# Patient Record
Sex: Male | Born: 1980
Health system: Southern US, Community
[De-identification: ages and names within clinical notes are randomized; demographics above are authoritative.]

## PROBLEM LIST (undated history)

## (undated) DIAGNOSIS — K589 Irritable bowel syndrome without diarrhea: Secondary | ICD-10-CM

## (undated) DIAGNOSIS — K219 Gastro-esophageal reflux disease without esophagitis: Secondary | ICD-10-CM

## (undated) DIAGNOSIS — R42 Dizziness and giddiness: Secondary | ICD-10-CM

## (undated) DIAGNOSIS — G8929 Other chronic pain: Secondary | ICD-10-CM

## (undated) DIAGNOSIS — IMO0001 Reserved for inherently not codable concepts without codable children: Secondary | ICD-10-CM

## (undated) DIAGNOSIS — M5020 Other cervical disc displacement, unspecified cervical region: Secondary | ICD-10-CM

## (undated) DIAGNOSIS — R1031 Right lower quadrant pain: Secondary | ICD-10-CM

## (undated) DIAGNOSIS — J309 Allergic rhinitis, unspecified: Secondary | ICD-10-CM

## (undated) DIAGNOSIS — R41840 Attention and concentration deficit: Secondary | ICD-10-CM

## (undated) DIAGNOSIS — L723 Sebaceous cyst: Secondary | ICD-10-CM

## (undated) DIAGNOSIS — R1013 Epigastric pain: Secondary | ICD-10-CM

## (undated) DIAGNOSIS — E785 Hyperlipidemia, unspecified: Secondary | ICD-10-CM

## (undated) DIAGNOSIS — M549 Dorsalgia, unspecified: Secondary | ICD-10-CM

## (undated) DIAGNOSIS — K649 Unspecified hemorrhoids: Secondary | ICD-10-CM

## (undated) HISTORY — DX: Sebaceous cyst: L72.3

## (undated) HISTORY — DX: Gastro-esophageal reflux disease without esophagitis: K21.9

## (undated) HISTORY — DX: Hyperlipidemia, unspecified: E78.5

## (undated) HISTORY — DX: Irritable bowel syndrome, unspecified: K58.9

## (undated) HISTORY — DX: Right lower quadrant pain: R10.31

## (undated) HISTORY — PX: MOUTH SURGERY: SHX715

## (undated) HISTORY — DX: Reserved for inherently not codable concepts without codable children: IMO0001

## (undated) HISTORY — DX: Allergic rhinitis, unspecified: J30.9

## (undated) HISTORY — DX: Attention and concentration deficit: R41.840

## (undated) HISTORY — DX: Other cervical disc displacement, unspecified cervical region: M50.20

## (undated) HISTORY — DX: Unspecified hemorrhoids: K64.9

## (undated) HISTORY — DX: Epigastric pain: R10.13

## (undated) HISTORY — PX: SPERMATIC VEIN LIGATION: SHX5144

## (undated) HISTORY — DX: Dizziness and giddiness: R42

## (undated) HISTORY — PX: HYDROCELE EXCISION / REPAIR: SUR1145

---

## 2008-12-12 ENCOUNTER — Ambulatory Visit (HOSPITAL_BASED_OUTPATIENT_CLINIC_OR_DEPARTMENT_OTHER): Admission: RE | Admit: 2008-12-12 | Discharge: 2008-12-12 | Payer: Self-pay | Admitting: Family Medicine

## 2008-12-12 ENCOUNTER — Ambulatory Visit: Payer: Self-pay | Admitting: Diagnostic Radiology

## 2008-12-31 ENCOUNTER — Ambulatory Visit: Payer: Self-pay | Admitting: Diagnostic Radiology

## 2008-12-31 ENCOUNTER — Ambulatory Visit (HOSPITAL_BASED_OUTPATIENT_CLINIC_OR_DEPARTMENT_OTHER): Admission: RE | Admit: 2008-12-31 | Discharge: 2008-12-31 | Payer: Self-pay | Admitting: Family Medicine

## 2010-10-31 HISTORY — PX: COLONOSCOPY: SHX174

## 2010-10-31 HISTORY — PX: ESOPHAGOGASTRODUODENOSCOPY: SHX1529

## 2012-06-20 ENCOUNTER — Ambulatory Visit (INDEPENDENT_AMBULATORY_CARE_PROVIDER_SITE_OTHER): Payer: Self-pay | Admitting: General Surgery

## 2012-06-27 ENCOUNTER — Ambulatory Visit (INDEPENDENT_AMBULATORY_CARE_PROVIDER_SITE_OTHER): Payer: Self-pay | Admitting: General Surgery

## 2012-07-11 ENCOUNTER — Telehealth (INDEPENDENT_AMBULATORY_CARE_PROVIDER_SITE_OTHER): Payer: Self-pay

## 2012-07-11 NOTE — Telephone Encounter (Signed)
Left message to call our office regarding appointment, appointment has been rescheduled to 07/20/12 @ 11:15 am w/Dr. Biagio Quint due to office cancellation

## 2012-07-12 ENCOUNTER — Ambulatory Visit (INDEPENDENT_AMBULATORY_CARE_PROVIDER_SITE_OTHER): Payer: Self-pay | Admitting: General Surgery

## 2012-07-19 ENCOUNTER — Telehealth (INDEPENDENT_AMBULATORY_CARE_PROVIDER_SITE_OTHER): Payer: Self-pay

## 2012-07-19 NOTE — Telephone Encounter (Signed)
Left voicemail for Larry Mercado @ ext [phone] requesting medical records.

## 2012-07-20 ENCOUNTER — Encounter (INDEPENDENT_AMBULATORY_CARE_PROVIDER_SITE_OTHER): Payer: Self-pay | Admitting: General Surgery

## 2012-07-20 ENCOUNTER — Ambulatory Visit (INDEPENDENT_AMBULATORY_CARE_PROVIDER_SITE_OTHER): Payer: Private Health Insurance - Indemnity | Admitting: General Surgery

## 2012-07-20 ENCOUNTER — Ambulatory Visit
Admission: RE | Admit: 2012-07-20 | Discharge: 2012-07-20 | Disposition: A | Discharge status: Home / Self Care | Payer: Managed Care, Other (non HMO) | Source: Ambulatory Visit | Attending: General Surgery | Admitting: General Surgery

## 2012-07-20 VITALS — BP 122/82 | HR 64 | Temp 97.4°F | Resp 12 | Ht 71.5 in | Wt 147.6 lb

## 2012-07-20 DIAGNOSIS — R1031 Right lower quadrant pain: Secondary | ICD-10-CM

## 2012-07-20 NOTE — Progress Notes (Signed)
Patient ID: Larry Mercado, male   DOB: 05/02/1981, 31 y.o.   MRN: [id number]  Chief Complaint  Patient presents with  . Inguinal Hernia    new pt- eval RIH    HPI Larry Mercado is a 31 y.o. male.   HPI This patient is referred by Dr. Nicholaus Bloom for evaluation of right groin pain and possible right inguinal hernia. He says that this has bothered him off and on for a few years now and has not really caused significant pain to limit his lifestyle but he has a "annoyance and discomfort" in his right groin area.  He was actually referred to a surgeon in [facility] last year for possible hernia but no hernia was found on exam and per the patient history no surgery was recommended. He denies any bulge in the area of and denies any obstructive symptoms although he does have some history of constipation.He also has a history of a right hydrocele repair as a child Past Medical History  Diagnosis Date  . Allergic rhinitis   . Dyslipidemia   . Dyspepsia   . GERD (gastroesophageal reflux disease)   . Groin pain, right lower quadrant   . IBS (irritable bowel syndrome)   . Sebaceous cyst     Past Surgical History  Procedure Date  . Mouth surgery   . Spermatic vein ligation   . Hydrocele excision / repair     Family History  Problem Relation Age of Onset  . Cancer Father     brain  . Heart disease Paternal Grandfather   . Cancer Maternal Grandmother     mesothelioma    Social History History  Substance Use Topics  . Smoking status: Never Smoker   . Smokeless tobacco: Not on file  . Alcohol Use: Yes    No Known Allergies  Current Outpatient Prescriptions  Medication Sig Dispense Refill  . aspirin 81 MG tablet Take 81 mg by mouth daily.      . fluticasone (FLONASE) 50 MCG/ACT nasal spray Place 2 sprays into the nose daily.      . Multiple Vitamin (MULTIVITAMIN) capsule Take 1 capsule by mouth daily.      . nabumetone (RELAFEN) 500 MG tablet Take 500 mg by mouth 2 (two) times daily.        Marland Kitchen NIASPAN 750 MG CR tablet daily.      Maxwell Caul Bicarbonate (ZEGERID) 20-1100 MG CAPS Take 1 capsule by mouth daily before breakfast.        Review of Systems Review of Systems All other review of systems negative or noncontributory except as stated in the HPI  Blood pressure 122/82, pulse 64, temperature 97.4 F (36.3 C), temperature source Temporal, resp. rate 12, height 5' 11.5" (1.816 m), weight 147 lb 9.6 oz (66.951 kg).  Physical Exam Physical Exam Physical Exam  Vitals reviewed. Constitutional: He is oriented to person, place, and time. He appears well-developed and well-nourished. No distress.  HENT:  Head: Normocephalic and atraumatic.  Mouth/Throat: No oropharyngeal exudate.  Eyes: Conjunctivae and EOM are normal. Pupils are equal, round, and reactive to light. Right eye exhibits no discharge. Left eye exhibits no discharge. No scleral icterus.  Neck: Normal range of motion. No tracheal deviation present.  Cardiovascular: Normal rate, regular rhythm and normal heart sounds.   Pulmonary/Chest: Effort normal and breath sounds normal. No stridor. No respiratory distress. He has no wheezes. He has no rales. He exhibits no tenderness.  Abdominal: Soft. Bowel sounds are normal. He exhibits  no distension and no mass. There is no tenderness. There is no rebound and no guarding. I do not appreciate any obvious hernias bilaterally on exam he does have a questionable cough impulse in the right and I can see what Dr. Nicholaus Bloom was appreciating but it is unclear if this is really a hernia. His testicles are normal and I do not appreciate any left inguinal hernia on exam  Musculoskeletal: Normal range of motion. He exhibits no edema and no tenderness.  Neurological: He is alert and oriented to person, place, and time.  Skin: Skin is warm and dry. No rash noted. He is not diaphoretic. No erythema. No pallor.  Psychiatric: He has a normal mood and affect. His behavior is normal.  Judgment and thought content normal.    Data Reviewed   Assessment    Right groin pain He does have some chronic right groin discomfort but it is unclear at this time whether he has a hernia.  I can appreciate a small cough impulse on the right but this is not clearly a hernia. I discussed with him the options of continued observation and watch waiting versus imaging with ultrasound or CT scan of versus diagnostic laparoscopy. We discussed the pros and cons of each approach with him we have decided to continue with dynamic abdominal ultrasound to evaluate for possible hernia. I recommended that he follow up with me in 3 weeks to discuss the results of his ultrasound. If his ultrasound is positive for hernia, and we will go ahead and discussed the surgical options if this is negative, then given the lack of obvious hernia on exam, I would not recommend any surgery and would continue with watchful waiting.    Plan    He will followup in 3 weeks to discuss his ultrasound results       [name] DAVID 07/20/2012, 11:51 AM

## 2012-07-26 ENCOUNTER — Telehealth (INDEPENDENT_AMBULATORY_CARE_PROVIDER_SITE_OTHER): Payer: Self-pay

## 2012-07-26 NOTE — Telephone Encounter (Signed)
Return call -- Mr. Tomassi notified of U/S results (No evidence of Inguinal hernia, mass or adenopathy).

## 2012-08-08 ENCOUNTER — Encounter (INDEPENDENT_AMBULATORY_CARE_PROVIDER_SITE_OTHER): Payer: Private Health Insurance - Indemnity | Admitting: General Surgery

## 2015-02-05 ENCOUNTER — Ambulatory Visit (INDEPENDENT_AMBULATORY_CARE_PROVIDER_SITE_OTHER): Payer: BLUE CROSS/BLUE SHIELD | Admitting: Family Medicine

## 2015-02-05 ENCOUNTER — Encounter: Payer: Self-pay | Admitting: Family Medicine

## 2015-02-05 VITALS — BP 119/77 | HR 63 | Ht 71.75 in | Wt 158.0 lb

## 2015-02-05 DIAGNOSIS — F332 Major depressive disorder, recurrent severe without psychotic features: Secondary | ICD-10-CM | POA: Diagnosis not present

## 2015-02-05 DIAGNOSIS — F418 Other specified anxiety disorders: Secondary | ICD-10-CM | POA: Diagnosis not present

## 2015-02-05 DIAGNOSIS — F32A Depression, unspecified: Secondary | ICD-10-CM

## 2015-02-05 DIAGNOSIS — L719 Rosacea, unspecified: Secondary | ICD-10-CM | POA: Diagnosis not present

## 2015-02-05 DIAGNOSIS — F419 Anxiety disorder, unspecified: Principal | ICD-10-CM

## 2015-02-05 DIAGNOSIS — F329 Major depressive disorder, single episode, unspecified: Secondary | ICD-10-CM | POA: Insufficient documentation

## 2015-02-05 DIAGNOSIS — R748 Abnormal levels of other serum enzymes: Secondary | ICD-10-CM | POA: Diagnosis not present

## 2015-02-05 LAB — LIPID PANEL
CHOL/HDL RATIO: 4.1 ratio
Cholesterol: 127 mg/dL (ref 0–200)
HDL: 31 mg/dL — AB (ref >=40)
LDL CALC: 76 mg/dL (ref 0–99)
TRIGLYCERIDES: 98 mg/dL (ref <150)
VLDL: 20 mg/dL (ref 0–40)

## 2015-02-05 MED ORDER — METRONIDAZOLE 1 % EX GEL: Freq: Every day | CUTANEOUS | Status: DC

## 2015-02-05 MED ORDER — ARIPIPRAZOLE 5 MG PO TABS: 5.0000 mg | ORAL_TABLET | Freq: Every day | ORAL | Status: DC

## 2015-02-05 NOTE — Progress Notes (Signed)
CC: Larry Mercado is a 34 y.o. male is here for Establish Care   Subjective: HPI:  Pleasant 34 year old here to establish care  Reports a history over the past 2 or 3 months of having episodic subjective depression it occurs every 2 or 3 days. He comes nowhere and within a matter of seconds he will have subjective gloom, depression, disinterest, sadness and occasionally crying spells. It'll last for a few hours and will resolve on its own. It's worse if he thinks about anything negative such as death, pain, etc. He's had these symptoms in the past when he was in high school and has tried Zoloft, Paxil, Prozac, Wellbutrin all of which were ineffective or caused significant side effects. He felt like he grew out of the symptoms late in high school and has not experienced any of these symptoms up until now. Nothing particularly makes it better or worse other than above. In the past he had thoughts of wanting to harm himself and he occasionally thinks about it now but never has a plan. He tells me that he is rational andhe would never harm himself or others. He's cut out alcohol and caffeine in his diet but it has not helped symptoms. Denies hallucinations paranoia or any other mental disturbance  Complains of a rash localized on the both cheeks that has been present for an unknown amount of time but seems to be worse after a shower. He is uncertain whether or not alcohol or sunlight amplifies it. He's tried moisturizing creams but it makes the symptoms worse. It's slightly tender and feels dry. Nothing else seems to make better or worse. He denies skin changes elsewhere  History of low HDL. Tells me that LDL and total cholesterol always in the normal range however HDL has been in the 30s but goes up to as high as 46 he has to take aspirin with this medication due to flushing and itching. No known cardiovascular disease.  Review of Systems - General ROS: negative for - chills, fever, night sweats, weight  gain or weight loss Ophthalmic ROS: negative for - decreased vision Psychological ROS: Positive for anxiety and depression ENT ROS: negative for - hearing change, nasal congestion, tinnitus or allergies Hematological and Lymphatic ROS: negative for - bleeding problems, bruising or swollen lymph nodes Breast ROS: negative Respiratory ROS: no cough, shortness of breath, or wheezing Cardiovascular ROS: no chest pain or dyspnea on exertion Gastrointestinal ROS: no abdominal pain, change in bowel habits, or black or bloody stools Genito-Urinary ROS: negative for - genital discharge, genital ulcers, incontinence or abnormal bleeding from genitals Musculoskeletal ROS: negative for - joint pain or muscle pain Neurological ROS: negative for - headaches or memory loss Dermatological ROS: negative for lumps, mole changes, rash and skin lesion changes other than that described above  Past Medical History  Diagnosis Date  . Allergic rhinitis   . Dyslipidemia   . Dyspepsia   . GERD (gastroesophageal reflux disease)   . Groin pain, right lower quadrant   . IBS (irritable bowel syndrome)   . Sebaceous cyst     Past Surgical History  Procedure Laterality Date  . Mouth surgery    . Spermatic vein ligation    . Hydrocele excision / repair     Family History  Problem Relation Age of Onset  . Cancer Father     brain  . Heart disease Paternal Grandfather   . Cancer Maternal Grandmother     mesothelioma    History  Social History  . Marital Status: Married    Spouse Name: N/A  . Number of Children: N/A  . Years of Education: N/A   Occupational History  . Not on file.   Social History Main Topics  . Smoking status: Never Smoker   . Smokeless tobacco: Not on file  . Alcohol Use: Yes  . Drug Use: No  . Sexual Activity: Not on file   Other Topics Concern  . Not on file   Social History Narrative     Objective: BP 119/77 mmHg  Pulse 63  Ht 5' 11.75" (1.822 m)  Wt 158 lb (71.668  kg)  BMI 21.59 kg/m2  General: Alert and Oriented, No Acute Distress HEENT: Pupils equal, round, reactive to light. Conjunctivae clear.  Moist mucous membranes Lungs: Clear to auscultation bilaterally, no wheezing/ronchi/rales.  Comfortable work of breathing. Good air movement. Cardiac: Regular rate and rhythm. Normal S1/S2.  No murmurs, rubs, nor gallops.   Extremities: No peripheral edema.  Strong peripheral pulses.  Mental Status: Mild to moderate depression and anxiety no paranoia or agitation Skin: Warm and dry. Maculopapular rash on both cheeks just above the beard not involving the nose  Assessment & Plan: Larry Mercado was seen today for establish care.  Diagnoses and all orders for this visit:  Anxiety and depression  Major depressive disorder, recurrent, severe without psychotic features Orders: -     ARIPiprazole (ABILIFY) 5 MG tablet; Take 1 tablet (5 mg total) by mouth daily.  Low serum HDL Orders: -     Lipid panel  Rosacea Orders: -     metroNIDAZOLE (METROGEL) 1 % gel; Apply topically daily. Only to redness on face.    anxiety and depression: uncontrolled chronic condition begin Abilify, follow-up in 2 weeks if no better otherwise return in one month if appearing to be effective. If ineffective next intervention would be something like Viibryid or Fetzima,  He specifically tells me he does not want to see a psychiatrist  Low serum HDL: checking lipid panel today  If he continues to be low we'll consider fish oil or restarting niacin  Rosacea:  This is only working diagnosis at this time, rash also be from eczema or seborrheic dermatitis, trial of topical metronidazole  Return for 2 week follow up if no improvement in mood, otherwise follow up one month..Marland Kitchen

## 2015-02-06 ENCOUNTER — Telehealth: Payer: Self-pay | Admitting: Family Medicine

## 2015-02-06 MED ORDER — FISH OIL 1000 MG PO CAPS: ORAL_CAPSULE | ORAL | Status: DC

## 2015-02-06 NOTE — Telephone Encounter (Signed)
Left message on vm

## 2015-02-06 NOTE — Telephone Encounter (Signed)
Sue Lushndrea, Will you please let patient know that his HDL cholesterol was slightly low as he predicted.  It was 31 with a goal of 40 or above.  I'd recommend starting a 1g OTC fish oil supplement with meals twice a day since this should help with cholesterol without any side effects.  We'll want to recheck this in 3 months.

## 2015-02-25 ENCOUNTER — Ambulatory Visit (INDEPENDENT_AMBULATORY_CARE_PROVIDER_SITE_OTHER): Payer: BLUE CROSS/BLUE SHIELD | Admitting: Family Medicine

## 2015-02-25 ENCOUNTER — Encounter: Payer: Self-pay | Admitting: Family Medicine

## 2015-02-25 VITALS — BP 112/77 | HR 86 | Temp 98.0°F | Resp 18 | Ht 71.0 in | Wt 160.0 lb

## 2015-02-25 DIAGNOSIS — F418 Other specified anxiety disorders: Secondary | ICD-10-CM | POA: Diagnosis not present

## 2015-02-25 DIAGNOSIS — F32A Depression, unspecified: Secondary | ICD-10-CM

## 2015-02-25 DIAGNOSIS — F329 Major depressive disorder, single episode, unspecified: Secondary | ICD-10-CM

## 2015-02-25 DIAGNOSIS — F419 Anxiety disorder, unspecified: Principal | ICD-10-CM

## 2015-02-25 DIAGNOSIS — R0602 Shortness of breath: Secondary | ICD-10-CM

## 2015-02-25 DIAGNOSIS — R0600 Dyspnea, unspecified: Secondary | ICD-10-CM | POA: Insufficient documentation

## 2015-02-25 DIAGNOSIS — R0609 Other forms of dyspnea: Secondary | ICD-10-CM | POA: Insufficient documentation

## 2015-02-25 MED ORDER — VILAZODONE HCL 10 & 20 MG PO KIT: 1.0000 | PACK | Freq: Every day | ORAL | Status: DC

## 2015-02-25 NOTE — Progress Notes (Signed)
CC: Larry Mercado is a 34 y.o. male is here for Depression   Subjective: HPI:  Follow-up depression: He tells me that since taking Abilify he has had a drastic improvement with his depressive episodes. He feels overall happy now and is extremely happy with the response of the medication with respect to depression. He is a little put off by a side effect of dizziness that has been present and not changing since he started taking the medication. It occurs both with activity and at rest. Also accompanied by a sense of heaviness in the back of his head. Symptoms are mild to moderate in severity but significantly interfering with quality of life. Denies thoughts of harming self or others. Denies any other new mental disturbance.  Complains of shortness of breath that has been present for matter of years. It occurs randomly when exerting himself such as climbing stairs. It is not reproducible or predictable. His former PCP gave him an albuterol inhaler however this is not helping or worsening the situation. When symptoms occur they last a few minutes and are accompanied by a sensation of pressure on the chest. There've been a few times that he's got the sensation even when relaxing and staying still. He denies cough, wheezing or chest discomfort and the other situation. This weekend he was active working around his yard and painting a fence and had no shortness of breath or chest discomfort whatsoever.    Review Of Systems Outlined In HPI  Past Medical History  Diagnosis Date  . Allergic rhinitis   . Dyslipidemia   . Dyspepsia   . GERD (gastroesophageal reflux disease)   . Groin pain, right lower quadrant   . IBS (irritable bowel syndrome)   . Sebaceous cyst     Past Surgical History  Procedure Laterality Date  . Mouth surgery    . Spermatic vein ligation    . Hydrocele excision / repair     Family History  Problem Relation Age of Onset  . Cancer Father     brain  . Heart disease Paternal  Grandfather   . Cancer Maternal Grandmother     mesothelioma    History   Social History  . Marital Status: Married    Spouse Name: N/A  . Number of Children: N/A  . Years of Education: N/A   Occupational History  . Not on file.   Social History Main Topics  . Smoking status: Never Smoker   . Smokeless tobacco: Not on file  . Alcohol Use: Yes  . Drug Use: No  . Sexual Activity: Not on file   Other Topics Concern  . Not on file   Social History Narrative     Objective: BP 112/77 mmHg  Pulse 86  Temp(Src) 98 F (36.7 C) (Oral)  Resp 18  Ht 5' 11" (1.803 m)  Wt 160 lb (72.576 kg)  BMI 22.33 kg/m2  SpO2 97%  General: Alert and Oriented, No Acute Distress HEENT: Pupils equal, round, reactive to light. Conjunctivae clear. Moist nevus membranes  Lungs: Clear to auscultation bilaterally, no wheezing/ronchi/rales.  Comfortable work of breathing. Good air movement. Cardiac: Regular rate and rhythm. Normal S1/S2.  No murmurs, rubs, nor gallops.   Extremities: No peripheral edema.  Strong peripheral pulses.  Mental Status: No depression, anxiety, nor agitation. Skin: Warm and dry.  Assessment & Plan: [name] was seen today for depression.  Diagnoses and all orders for this visit:  Anxiety and depression  SOB (shortness of breath)  Other  orders -     Vilazodone HCl (VIIBRYD STARTER PACK) 10 & 20 MG KIT; Take 1 tablet by mouth daily.   Anxiety and depression: Improved and controlled however side effects from Abilify. Joint decision to stop this medication and begin viibryd, 1 month prescription for the starter kit was provided. If beneficial with no side effects all he needs to do is call for refills to last him for 3 months. If side effects are ineffective follow-up before running out of the starter kit. Shortness of breath: I've asked him to follow-up at his convenience for a 30 minute spirometry visit for further investigation into this.  Return in about 3 months  (around 05/27/2015).

## 2015-03-12 ENCOUNTER — Telehealth: Payer: Self-pay

## 2015-03-12 MED ORDER — ARIPIPRAZOLE 2 MG PO TABS: 2.0000 mg | ORAL_TABLET | Freq: Every day | ORAL | Status: DC

## 2015-03-12 NOTE — Telephone Encounter (Signed)
 2mg  [facility] [place], he had been taking the 5mg , hopefully less chance of side effects

## 2015-03-12 NOTE — Telephone Encounter (Signed)
Patient states the medication (Viibryd) is not working as well and the Abilify. He did like how the Abilify work but he didn't like the side effects. He is hoping to try a different dose of the Abilify. Please advise.

## 2015-03-13 NOTE — Telephone Encounter (Signed)
Patient advised.

## 2015-03-26 ENCOUNTER — Telehealth: Payer: Self-pay | Admitting: *Deleted

## 2015-03-26 MED ORDER — ARIPIPRAZOLE 2 MG PO TABS: 4.0000 mg | ORAL_TABLET | Freq: Every day | ORAL | Status: DC

## 2015-03-26 NOTE — Telephone Encounter (Signed)
Patient may certainly take 4 mg, doubling up on his current dose. It does come in multiple other dosage forms, he is on only the starting dose.

## 2015-03-26 NOTE — Telephone Encounter (Signed)
Pt called in this morning stating that the abilify seems to just be "taking the edge off".  He said that he's tolerating the side effects but wanted to know if there was another dose in between the 2mg  & 5mg , or if he could just take 2 tabs to equal 4mg .  Please advise.

## 2015-03-26 NOTE — Telephone Encounter (Signed)
LMOM notifying pt to increase to 4mg  of abilify and to f/u with Dr. Ivan AnchorsHommel sometime within the next 2 weeks.

## 2015-03-26 NOTE — Addendum Note (Signed)
Addended by: Collie SiadICHARDSON, Andruw Battie M on: 03/26/2015 02:44 PM   Modules accepted: Orders

## 2015-04-15 ENCOUNTER — Ambulatory Visit (INDEPENDENT_AMBULATORY_CARE_PROVIDER_SITE_OTHER): Payer: BLUE CROSS/BLUE SHIELD | Admitting: Family Medicine

## 2015-04-15 ENCOUNTER — Encounter: Payer: Self-pay | Admitting: Family Medicine

## 2015-04-15 VITALS — BP 116/72 | HR 78 | Wt 163.0 lb

## 2015-04-15 DIAGNOSIS — F418 Other specified anxiety disorders: Secondary | ICD-10-CM | POA: Diagnosis not present

## 2015-04-15 DIAGNOSIS — F329 Major depressive disorder, single episode, unspecified: Secondary | ICD-10-CM

## 2015-04-15 DIAGNOSIS — E786 Lipoprotein deficiency: Secondary | ICD-10-CM | POA: Diagnosis not present

## 2015-04-15 DIAGNOSIS — F32A Depression, unspecified: Secondary | ICD-10-CM

## 2015-04-15 DIAGNOSIS — F419 Anxiety disorder, unspecified: Principal | ICD-10-CM

## 2015-04-15 MED ORDER — ARIPIPRAZOLE 2 MG PO TABS: 4.0000 mg | ORAL_TABLET | Freq: Every day | ORAL | Status: DC

## 2015-04-15 NOTE — Progress Notes (Signed)
CC: Larry Mercado is a 34 y.o. male is here for f/u anxiety and depression   Subjective: HPI:  Follow-up anxiety and depression: Last month he began taking 2, 2 milligram tablets of Abilify daily. Currently he feels like his depressive and anxiety symptoms are 90% resolved with 0% side effects. He is pleased with this balance compared to what he was experiencing on the 5 mg formulation. He denies any known side effects other than some hunger. He states he is pretty happy with the response at this point. He denies thoughts of harm self or others.  Follow-up HDL deficiency. He's been taking 1200 mg of fish oil twice a day for 2 months now. He denies any right upper quadrant pain and chest pain or limb claudication.   Review Of Systems Outlined In HPI  Past Medical History  Diagnosis Date  . Allergic rhinitis   . Dyslipidemia   . Dyspepsia   . GERD (gastroesophageal reflux disease)   . Groin pain, right lower quadrant   . IBS (irritable bowel syndrome)   . Sebaceous cyst     Past Surgical History  Procedure Laterality Date  . Mouth surgery    . Spermatic vein ligation    . Hydrocele excision / repair     Family History  Problem Relation Age of Onset  . Cancer Father     brain  . Heart disease Paternal Grandfather   . Cancer Maternal Grandmother     mesothelioma    History   Social History  . Marital Status: Married    Spouse Name: N/A  . Number of Children: N/A  . Years of Education: N/A   Occupational History  . Not on file.   Social History Main Topics  . Smoking status: Never Smoker   . Smokeless tobacco: Not on file  . Alcohol Use: Yes  . Drug Use: No  . Sexual Activity: Not on file   Other Topics Concern  . Not on file   Social History Narrative     Objective: BP 116/72 mmHg  Pulse 78  Wt 163 lb (73.936 kg)  Vital signs reviewed. General: Alert and Oriented, No Acute Distress HEENT: Pupils equal, round, reactive to light. Conjunctivae clear.   External ears unremarkable.  Moist mucous membranes. Lungs: Clear and comfortable work of breathing, speaking in full sentences without accessory muscle use. Cardiac: Regular rate and rhythm.  Neuro: CN II-XII grossly intact, gait normal. Extremities: No peripheral edema.  Strong peripheral pulses.  Mental Status: No depression, anxiety, nor agitation. Logical though process. Skin: Warm and dry.  Assessment & Plan: Larry Mercado was seen today for f/u anxiety and depression.  Diagnoses and all orders for this visit:  Anxiety and depression  Low HDL (under 40) Orders: -     Lipid panel  Other orders -     ARIPiprazole (ABILIFY) 2 MG tablet; Take 2 tablets (4 mg total) by mouth daily.   Anxiety and depression: Controlled he is willing to put up with the side effect of hunger for the benefit this is providing him. Continue 4 mg of Abilify daily. Low HDL: Continue fish oil and recheck lipid panel in 1 month, lab slips were provided for lab only visit  Return in about 6 months (around 10/15/2015) for Mood.

## 2015-05-18 ENCOUNTER — Encounter: Payer: Self-pay | Admitting: Family Medicine

## 2015-05-18 ENCOUNTER — Ambulatory Visit (INDEPENDENT_AMBULATORY_CARE_PROVIDER_SITE_OTHER): Payer: BLUE CROSS/BLUE SHIELD | Admitting: Family Medicine

## 2015-05-18 VITALS — BP 106/69 | HR 76 | Wt 164.0 lb

## 2015-05-18 DIAGNOSIS — F418 Other specified anxiety disorders: Secondary | ICD-10-CM

## 2015-05-18 DIAGNOSIS — R748 Abnormal levels of other serum enzymes: Secondary | ICD-10-CM

## 2015-05-18 DIAGNOSIS — F32A Depression, unspecified: Secondary | ICD-10-CM

## 2015-05-18 DIAGNOSIS — F419 Anxiety disorder, unspecified: Principal | ICD-10-CM

## 2015-05-18 DIAGNOSIS — F329 Major depressive disorder, single episode, unspecified: Secondary | ICD-10-CM

## 2015-05-18 MED ORDER — VENLAFAXINE HCL ER 75 MG PO CP24: 75.0000 mg | ORAL_CAPSULE | Freq: Every day | ORAL | Status: DC

## 2015-05-18 NOTE — Progress Notes (Signed)
CC: Larry FinlayJeremy Mercado is a 34 y.o. male is here for f/u abilify   Subjective: HPI:  Follow-up anxiety and depression: States that anxiety is manageable right now with mild to no interfering with quality of life. States that depression seems to be more pronounced and has been worsening the more he hears about mass shootings and natural disasters both domestically and internationally. He's lost interest in hobbies. He was more irritable towards others. He just feels plain out sad. He denies thoughts of wanting to harm self or others. Denies any known side effects from Abilify. He wants but he can add to Abilify. Denies any paranoia or any motor or sensory disturbances. Denies unintentional weight loss or gain.  He's due for repeat of his HDL. He's been taking official capsule every day now. No physical exercise routine. Denies any exertional chest pain or limb claudication.  Review Of Systems Outlined In HPI  Past Medical History  Diagnosis Date  . Allergic rhinitis   . Dyslipidemia   . Dyspepsia   . GERD (gastroesophageal reflux disease)   . Groin pain, right lower quadrant   . IBS (irritable bowel syndrome)   . Sebaceous cyst     Past Surgical History  Procedure Laterality Date  . Mouth surgery    . Spermatic vein ligation    . Hydrocele excision / repair     Family History  Problem Relation Age of Onset  . Cancer Father     brain  . Heart disease Paternal Grandfather   . Cancer Maternal Grandmother     mesothelioma    History   Social History  . Marital Status: Married    Spouse Name: N/A  . Number of Children: N/A  . Years of Education: N/A   Occupational History  . Not on file.   Social History Main Topics  . Smoking status: Never Smoker   . Smokeless tobacco: Not on file  . Alcohol Use: Yes  . Drug Use: No  . Sexual Activity: Not on file   Other Topics Concern  . Not on file   Social History Narrative     Objective: BP 106/69 mmHg  Pulse 76  Wt 164 lb  (74.39 kg)  Vital signs reviewed. General: Alert and Oriented, No Acute Distress HEENT: Pupils equal, round, reactive to light. Conjunctivae clear.  External ears unremarkable.  Moist mucous membranes. Lungs: Clear and comfortable work of breathing, speaking in full sentences without accessory muscle use. Cardiac: Regular rate and rhythm.  Neuro: CN II-XII grossly intact, gait normal. Extremities: No peripheral edema.  Strong peripheral pulses.  Mental Status: No anxiety, nor agitation. Logical though process. Skin: Warm and dry.  Assessment & Plan: Larry Mercado was seen today for f/u abilify.  Diagnoses and all orders for this visit:  Anxiety and depression Orders: -     venlafaxine XR (EFFEXOR XR) 75 MG 24 hr capsule; Take 1 capsule (75 mg total) by mouth daily with breakfast.  Low serum HDL   Anxiety depression: Anxiety seems to control depression is uncontrolled. Start low-dose Effexor along with current dose of Abilify. Low HDL: Due for repeat lipid panel, lab slip provided.  Return if symptoms worsen or fail to improve.

## 2015-05-19 ENCOUNTER — Telehealth: Payer: Self-pay | Admitting: Family Medicine

## 2015-05-19 LAB — LIPID PANEL
Cholesterol: 127 mg/dL (ref 0–200)
HDL: 34 mg/dL — ABNORMAL LOW (ref >=40)
LDL CALC: 69 mg/dL (ref 0–99)
TRIGLYCERIDES: 122 mg/dL (ref <150)
Total CHOL/HDL Ratio: 3.7 Ratio
VLDL: 24 mg/dL (ref 0–40)

## 2015-05-19 MED ORDER — NIACIN 500 MG PO TABS: 500.0000 mg | ORAL_TABLET | Freq: Every day | ORAL | Status: DC

## 2015-05-19 NOTE — Telephone Encounter (Signed)
Left message on voicemail with results

## 2015-05-19 NOTE — Telephone Encounter (Signed)
Sue Lushndrea, Will you please let patient know that his HDL cholesterol really hasn't improved by much, just by three points and is still in the deficient range.  I'd recommend stopping his fish oil supplement and switching to OTC niacin at a dose of 500-1000mg  nightly whichever dose is most tolerated when it comes to the most common side effect of a flushing sensation.  Repeat this test in three months.

## 2015-09-01 ENCOUNTER — Encounter: Payer: Self-pay | Admitting: Family Medicine

## 2015-09-01 ENCOUNTER — Ambulatory Visit (INDEPENDENT_AMBULATORY_CARE_PROVIDER_SITE_OTHER): Payer: BLUE CROSS/BLUE SHIELD | Admitting: Family Medicine

## 2015-09-01 VITALS — BP 114/72 | HR 81 | Temp 98.0°F | Wt 161.0 lb

## 2015-09-01 DIAGNOSIS — F418 Other specified anxiety disorders: Secondary | ICD-10-CM | POA: Diagnosis not present

## 2015-09-01 DIAGNOSIS — R05 Cough: Secondary | ICD-10-CM

## 2015-09-01 DIAGNOSIS — R059 Cough, unspecified: Secondary | ICD-10-CM

## 2015-09-01 DIAGNOSIS — L719 Rosacea, unspecified: Secondary | ICD-10-CM | POA: Diagnosis not present

## 2015-09-01 DIAGNOSIS — R41840 Attention and concentration deficit: Secondary | ICD-10-CM | POA: Diagnosis not present

## 2015-09-01 DIAGNOSIS — F32A Depression, unspecified: Secondary | ICD-10-CM

## 2015-09-01 DIAGNOSIS — F419 Anxiety disorder, unspecified: Principal | ICD-10-CM

## 2015-09-01 DIAGNOSIS — F329 Major depressive disorder, single episode, unspecified: Secondary | ICD-10-CM

## 2015-09-01 MED ORDER — DOXYCYCLINE 40 MG PO CPDR: 40.0000 mg | DELAYED_RELEASE_CAPSULE | ORAL | Status: DC

## 2015-09-01 MED ORDER — PREDNISONE 20 MG PO TABS: ORAL_TABLET | ORAL | Status: AC

## 2015-09-01 NOTE — Progress Notes (Signed)
CC: Larry FinlayJeremy Mercado is a 34 y.o. male is here for Cough   Subjective: HPI:  Follow-up anxiety and depression: Currently only taking Abilify on a daily basis. He did not end up taking the Effexor prescribed last time he was here. He denies any anxiety or depression is interfering with his quality of life right now.  Reports difficulty with concentration as a child and continuing in adulthood. He was diagnosed with ADHD and treated as a child but he wants to know whether or not he still suffers from this condition. He is noticing difficulty keeping on track at work and at home when it comes to responsibilities. His wife has noticed that he is easily distractible.  Complains of a cough that has been present for the past week and a daily basis. It's persistent and all hours of the day. Accompanied by soreness in the chest when he coughs but no pain with breathing. He denies any new shortness of breath, wheezing, nor blood in sputum. He denies nasal congestion or sore throat. No fevers or chills. No benefit from Robitussin  Complains of persistent rosacea for the described as redness and cracking of the skin on the cheeks. Symptoms are improved with using metronidazole however if he skips a few days it comes right back. He started having to use a topical preparation once another something better. He denies skin changes elsewhere   Review Of Systems Outlined In HPI  Past Medical History  Diagnosis Date  . Allergic rhinitis   . Dyslipidemia   . Dyspepsia   . GERD (gastroesophageal reflux disease)   . Groin pain, right lower quadrant   . IBS (irritable bowel syndrome)   . Sebaceous cyst     Past Surgical History  Procedure Laterality Date  . Mouth surgery    . Spermatic vein ligation    . Hydrocele excision / repair     Family History  Problem Relation Age of Onset  . Cancer Father     brain  . Heart disease Paternal Grandfather   . Cancer Maternal Grandmother     mesothelioma     Social History   Social History  . Marital Status: Married    Spouse Name: N/A  . Number of Children: N/A  . Years of Education: N/A   Occupational History  . Not on file.   Social History Main Topics  . Smoking status: Never Smoker   . Smokeless tobacco: Not on file  . Alcohol Use: Yes  . Drug Use: No  . Sexual Activity: Not on file   Other Topics Concern  . Not on file   Social History Narrative     Objective: BP 114/72 mmHg  Pulse 81  Temp(Src) 98 F (36.7 C)  Wt 161 lb (73.029 kg)  General: Alert and Oriented, No Acute Distress HEENT: Pupils equal, round, reactive to light. Conjunctivae clear.  External ears unremarkable, canals clear with intact TMs with appropriate landmarks.  Middle ear appears open without effusion. Pink inferior turbinates.  Moist mucous membranes, pharynx without inflammation nor lesions.  Neck supple without palpable lymphadenopathy nor abnormal masses. Lungs: Clear to auscultation bilaterally, no wheezing/ronchi/rales.  Comfortable work of breathing. Good air movement. Cardiac: Regular rate and rhythm. Normal S1/S2.  No murmurs, rubs, nor gallops.   Extremities: No peripheral edema.  Strong peripheral pulses.  Mental Status: No depression, anxiety, nor agitation. Skin: Warm and dry, moderate erythema in peeling on the cheeks just lateral to the naris.  Assessment & Plan:  Larry Mercado was seen today for cough.  Diagnoses and all orders for this visit:  Anxiety and depression  Poor concentration -     Ambulatory referral to Psychology  Cough -     predniSONE (DELTASONE) 20 MG tablet; Three tabs at once daily for five days.  Rosacea -     doxycycline (ORACEA) 40 MG capsule; Take 1 capsule (40 mg total) by mouth every morning.   Anxiety and depression: Controlled continue Abilify Poor concentration: Referral to Dr. Marisue Brooklyn for formal testing for ADHD Cough: Start prednisone burst Rosacea: Uncontrolled chronic condition, starting  low-dose doxycycline  Return in about 3 months (around 12/02/2015).

## 2015-09-09 ENCOUNTER — Ambulatory Visit (INDEPENDENT_AMBULATORY_CARE_PROVIDER_SITE_OTHER): Payer: BLUE CROSS/BLUE SHIELD | Admitting: Family Medicine

## 2015-09-09 ENCOUNTER — Encounter: Payer: Self-pay | Admitting: Family Medicine

## 2015-09-09 VITALS — BP 113/74 | HR 76 | Wt 160.0 lb

## 2015-09-09 DIAGNOSIS — J189 Pneumonia, unspecified organism: Secondary | ICD-10-CM | POA: Diagnosis not present

## 2015-09-09 MED ORDER — LEVOFLOXACIN 500 MG PO TABS: 500.0000 mg | ORAL_TABLET | Freq: Every day | ORAL | Status: DC

## 2015-09-09 NOTE — Progress Notes (Signed)
CC: Larry FinlayJeremy Mercado is a 34 y.o. male is here for URI   Subjective: HPI:  Continued cough now more productive present all hours of the day with some chest pain on the left side of the chest that radiates into the back.  Pain is only present when coughing or taking a deep breath. Symptoms have not improved whatsoever since taking  Prednisone. Symptoms are present all hours today but not interfere with sleep. No benefit from DayQuil. No other interventions as of yet. New symptom of sore throat and some nausea. He denies fevers, chills, wheezing, shortness of breath, exertional chest pain, rash, nasal congestion or postnasal drip.   Review Of Systems Outlined In HPI  Past Medical History  Diagnosis Date  . Allergic rhinitis   . Dyslipidemia   . Dyspepsia   . GERD (gastroesophageal reflux disease)   . Groin pain, right lower quadrant   . IBS (irritable bowel syndrome)   . Sebaceous cyst     Past Surgical History  Procedure Laterality Date  . Mouth surgery    . Spermatic vein ligation    . Hydrocele excision / repair     Family History  Problem Relation Age of Onset  . Cancer Father     brain  . Heart disease Paternal Grandfather   . Cancer Maternal Grandmother     mesothelioma    Social History   Social History  . Marital Status: Married    Spouse Name: N/A  . Number of Children: N/A  . Years of Education: N/A   Occupational History  . Not on file.   Social History Main Topics  . Smoking status: Never Smoker   . Smokeless tobacco: Not on file  . Alcohol Use: Yes  . Drug Use: No  . Sexual Activity: Not on file   Other Topics Concern  . Not on file   Social History Narrative     Objective: BP 113/74 mmHg  Pulse 76  Wt 160 lb (72.576 kg)  General: Alert and Oriented, No Acute Distress HEENT: Pupils equal, round, reactive to light. Conjunctivae clear.  External ears unremarkable, canals clear with intact TMs with appropriate landmarks.  Middle ear appears open  without effusion. Pink inferior turbinates.  Moist mucous membranes, pharynx without inflammation nor lesions.  Neck supple without palpable lymphadenopathy nor abnormal masses. Lungs: comfortable work of breathing, occasional coughing. Rhonchi in the left posterior lung fields, otherwise no rhonchi rales or wheezing. Cardiac: Regular rate and rhythm. Normal S1/S2.  No murmurs, rubs, nor gallops.   Extremities: No peripheral edema.  Strong peripheral pulses.  Mental Status: No depression, anxiety, nor agitation. Skin: Warm and dry.  Assessment & Plan: Larry Mercado was seen today for uri.  Diagnoses and all orders for this visit:  Walking pneumonia -     levofloxacin (LEVAQUIN) 500 MG tablet; Take 1 tablet (500 mg total) by mouth daily.   Suspect walking pneumonia therefore start Levaquin. Also encouraged to start on Mucinex to help with cough. Signs and symptoms requring emergent/urgent reevaluation were discussed with the patient.  Return if symptoms worsen or fail to improve.

## 2015-10-14 ENCOUNTER — Ambulatory Visit (INDEPENDENT_AMBULATORY_CARE_PROVIDER_SITE_OTHER): Payer: BLUE CROSS/BLUE SHIELD

## 2015-10-14 ENCOUNTER — Other Ambulatory Visit: Payer: Self-pay | Admitting: Family Medicine

## 2015-10-14 ENCOUNTER — Ambulatory Visit (INDEPENDENT_AMBULATORY_CARE_PROVIDER_SITE_OTHER): Payer: BLUE CROSS/BLUE SHIELD | Admitting: Family Medicine

## 2015-10-14 ENCOUNTER — Encounter: Payer: Self-pay | Admitting: Family Medicine

## 2015-10-14 VITALS — BP 113/61 | HR 77 | Ht 71.0 in | Wt 167.0 lb

## 2015-10-14 DIAGNOSIS — S99922A Unspecified injury of left foot, initial encounter: Secondary | ICD-10-CM

## 2015-10-14 DIAGNOSIS — M79672 Pain in left foot: Secondary | ICD-10-CM

## 2015-10-14 NOTE — Assessment & Plan Note (Signed)
Likely contusion to the calcaneus or the soft tissues of the plantar mid and hindfoot. He may have soreness in the plantar fascia as well. Patient improved with heel cups. Plan for watchful waiting and relative rest. Use NSAIDs at prescription strength as needed. Return if not improving.

## 2015-10-14 NOTE — Patient Instructions (Signed)
Thank you for coming in today. Use the gel heel cups as needed for pain.  Take up to 2 aleve twice daily for pain as needed.   Use ice massage.  Return as needed in 2 weeks if not better.

## 2015-10-14 NOTE — Progress Notes (Signed)
Quick Note:  Normal, no changes. ______ 

## 2015-10-14 NOTE — Progress Notes (Signed)
Subjective:    I'm seeing this patient as a consultation for:  Dr. Ivan Anchors  CC: Left foot pain  HPI: Patient fell down the stairs 2 days ago. He injured the plantar left foot. He notes pain at the plantar hindfoot. Pain is worse with ambulation and pressure. He denies any radiating pain weakness or numbness fevers or chills. He's tried some over-the-counter medicines which helped a little bit. He denies any bruising or swelling.  Past medical history, Surgical history, Family history not pertinant except as noted below, Social history, Allergies, and medications have been entered into the medical record, reviewed, and no changes needed.   Review of Systems: No headache, visual changes, nausea, vomiting, diarrhea, constipation, dizziness, abdominal pain, skin rash, fevers, chills, night sweats, weight loss, swollen lymph nodes, body aches, joint swelling, muscle aches, chest pain, shortness of breath, mood changes, visual or auditory hallucinations.   Objective:    Filed Vitals:   10/14/15 0911  BP: 113/61  Pulse: 77   General: Well Developed, well nourished, and in no acute distress.  Neuro/Psych: Alert and oriented x3, extra-ocular muscles intact, able to move all 4 extremities, sensation grossly intact. Skin: Warm and dry, no rashes noted.  Respiratory: Not using accessory muscles, speaking in full sentences, trachea midline.  Cardiovascular: Pulses palpable, no extremity edema. Abdomen: Does not appear distended. MSK: Left foot is normal-appearing without any ecchymosis or swelling or erythema. The ankle is normal appearing and nontender with normal motion The mid foot is nontender on the dorsal aspect. The forefoot is completely nontender. The plantar navicular and os calcis are mildly tender to touch. Normal foot motion. Pulses capillary refill and sensation are intact. Normal gait.  Patient was given silicone gel heel pads and had some improvement in symptoms.  No results  found for this or any previous visit (from the past 24 hour(s)). Dg Os Calcis Left  10/14/2015  CLINICAL DATA:  Status post fall down stairs 3 days ago with injury of the left foot and heel with increasing pain over the past 3 days. EXAM: LEFT FOOT - COMPLETE 3+ VIEW; LEFT OS CALCIS - 2+ VIEW COMPARISON:  None. FINDINGS: Left foot: The bones of the foot are adequately mineralized. The joint spaces are preserved. There is no acute fracture nor dislocation. The soft tissues are unremarkable. Calcaneus series: AP and lateral views of the calcaneus reveal no acute fracture nor dislocation. The adjacent talus and other tarsal bones are normal. The distal tibia and fibula. Intact where visualized. IMPRESSION: There is no acute fracture nor dislocation of the bones of the left foot. Specific attention to the calcaneus reveals no acute abnormality. Electronically Signed   By: David  [place] M.D.   On: 10/14/2015 09:32   Dg Foot Complete Left  10/14/2015  CLINICAL DATA:  Status post fall down stairs 3 days ago with injury of the left foot and heel with increasing pain over the past 3 days. EXAM: LEFT FOOT - COMPLETE 3+ VIEW; LEFT OS CALCIS - 2+ VIEW COMPARISON:  None. FINDINGS: Left foot: The bones of the foot are adequately mineralized. The joint spaces are preserved. There is no acute fracture nor dislocation. The soft tissues are unremarkable. Calcaneus series: AP and lateral views of the calcaneus reveal no acute fracture nor dislocation. The adjacent talus and other tarsal bones are normal. The distal tibia and fibula. Intact where visualized. IMPRESSION: There is no acute fracture nor dislocation of the bones of the left foot. Specific attention to the  calcaneus reveals no acute abnormality. Electronically Signed   By: David  [place] M.D.   On: 10/14/2015 09:32    Impression and Recommendations:   This case required medical decision making of moderate complexity.

## 2015-12-14 ENCOUNTER — Encounter: Payer: Self-pay | Admitting: Family Medicine

## 2015-12-14 ENCOUNTER — Other Ambulatory Visit: Payer: Self-pay | Admitting: Family Medicine

## 2015-12-14 DIAGNOSIS — F988 Other specified behavioral and emotional disorders with onset usually occurring in childhood and adolescence: Secondary | ICD-10-CM | POA: Insufficient documentation

## 2015-12-14 DIAGNOSIS — F909 Attention-deficit hyperactivity disorder, unspecified type: Secondary | ICD-10-CM | POA: Insufficient documentation

## 2016-01-20 ENCOUNTER — Other Ambulatory Visit: Payer: Self-pay | Admitting: Family Medicine

## 2016-01-20 NOTE — Telephone Encounter (Signed)
Rx approved and sent to [facility] in [place]

## 2016-01-20 NOTE — Telephone Encounter (Signed)
Pt was given doxycycline for Rosacea back in Nov.. Is a refill appropriate?

## 2016-04-24 ENCOUNTER — Other Ambulatory Visit: Payer: Self-pay | Admitting: Family Medicine

## 2016-05-04 DIAGNOSIS — F902 Attention-deficit hyperactivity disorder, combined type: Secondary | ICD-10-CM | POA: Diagnosis not present

## 2016-05-04 DIAGNOSIS — Z79899 Other long term (current) drug therapy: Secondary | ICD-10-CM | POA: Diagnosis not present

## 2016-05-04 DIAGNOSIS — F338 Other recurrent depressive disorders: Secondary | ICD-10-CM | POA: Diagnosis not present

## 2016-05-04 DIAGNOSIS — F419 Anxiety disorder, unspecified: Secondary | ICD-10-CM | POA: Diagnosis not present

## 2016-05-23 ENCOUNTER — Other Ambulatory Visit: Payer: Self-pay | Admitting: Sports Medicine

## 2016-05-23 NOTE — Telephone Encounter (Signed)
Refill sent to his [facility] on [address], f/u needed for future refills

## 2016-05-26 ENCOUNTER — Telehealth: Payer: Self-pay | Admitting: Family Medicine

## 2016-05-26 NOTE — Telephone Encounter (Signed)
Our office sent over a 30 day supply of Abilify, spoke with pharmacy and his insurance will only pay for 90 day Rx's. Pt has scheduled a follow up. Will route to PCP to see if this is appropriate to change quantity.

## 2016-05-27 MED ORDER — ARIPIPRAZOLE 2 MG PO TABS: ORAL_TABLET | ORAL | 0 refills | Status: DC

## 2016-05-27 NOTE — Telephone Encounter (Signed)
Approved since he has an appointment next week.

## 2016-06-02 ENCOUNTER — Encounter: Payer: Self-pay | Admitting: Family Medicine

## 2016-06-02 ENCOUNTER — Ambulatory Visit (INDEPENDENT_AMBULATORY_CARE_PROVIDER_SITE_OTHER): Payer: BLUE CROSS/BLUE SHIELD | Admitting: Family Medicine

## 2016-06-02 VITALS — BP 118/76 | HR 83 | Wt 166.0 lb

## 2016-06-02 DIAGNOSIS — F329 Major depressive disorder, single episode, unspecified: Secondary | ICD-10-CM

## 2016-06-02 DIAGNOSIS — F32A Depression, unspecified: Secondary | ICD-10-CM

## 2016-06-02 DIAGNOSIS — F909 Attention-deficit hyperactivity disorder, unspecified type: Secondary | ICD-10-CM

## 2016-06-02 DIAGNOSIS — F418 Other specified anxiety disorders: Secondary | ICD-10-CM

## 2016-06-02 DIAGNOSIS — F419 Anxiety disorder, unspecified: Secondary | ICD-10-CM

## 2016-06-02 MED ORDER — DOXYCYCLINE 40 MG PO CPDR: DELAYED_RELEASE_CAPSULE | ORAL | 1 refills | Status: DC

## 2016-06-02 NOTE — Progress Notes (Signed)
CC: Larry Mercado is a 35 y.o. male is here for Anxiety; Depression; and Medication Refill   Subjective: HPI:  Follow-up depression: He's been holding Abilify for 2 weeks now without any known side effects prior to stopping and no new symptoms since stopping. He is doing this as a experiment to see if he still needs to be on this medication. He denies any depression or anxiety. He's currently taking Vyvanse for ADHD and he believes it is helping better than Adderall. He denies any mental disturbance or confusion. No thoughts of harm to self or others   Review Of Systems Outlined In HPI  Past Medical History:  Diagnosis Date  . Allergic rhinitis   . Dyslipidemia   . Dyspepsia   . GERD (gastroesophageal reflux disease)   . Groin pain, right lower quadrant   . IBS (irritable bowel syndrome)   . Sebaceous cyst     Past Surgical History:  Procedure Laterality Date  . HYDROCELE EXCISION / REPAIR    . MOUTH SURGERY    . SPERMATIC VEIN LIGATION     Family History  Problem Relation Age of Onset  . Cancer Father     brain  . Heart disease Paternal Grandfather   . Cancer Maternal Grandmother     mesothelioma    Social History   Social History  . Marital status: Married    Spouse name: N/A  . Number of children: N/A  . Years of education: N/A   Occupational History  . Not on file.   Social History Main Topics  . Smoking status: Never Smoker  . Smokeless tobacco: Not on file  . Alcohol use Yes  . Drug use: No  . Sexual activity: Not on file   Other Topics Concern  . Not on file   Social History Narrative  . No narrative on file     Objective: BP 118/76   Pulse 83   Wt 166 lb (75.3 kg)   BMI 23.15 kg/m  Vital signs reviewed. General: Alert and Oriented, No Acute Distress HEENT: Pupils equal, round, reactive to light. Conjunctivae clear.  External ears unremarkable.  Moist mucous membranes. Lungs: Clear and comfortable work of breathing, speaking in full  sentences without accessory muscle use. Cardiac: Regular rate and rhythm.  Neuro: CN II-XII grossly intact, gait normal. Extremities: No peripheral edema.  Strong peripheral pulses.  Mental Status: No depression, anxiety, nor agitation. Logical though process. Skin: Warm and dry.  Assessment & Plan: Larry Mercado was seen today for anxiety, depression and medication refill.  Diagnoses and all orders for this visit:  Attention deficit hyperactivity disorder (ADHD), unspecified ADHD type  Anxiety and depression  Other orders -     Discontinue: doxycycline (ORACEA) 40 MG capsule; TAKE 1 CAPSULE(40 MG) BY MOUTH EVERY MORNING -     doxycycline (ORACEA) 40 MG capsule; TAKE 1 CAPSULE(40 MG) BY MOUTH EVERY MORNING   Anxiety depression: Currently controlled without Abilify. It seems that his ADHD was probably causing some of his anxiety and depression and now that it's adequately controlled he does not need to be on the Abilify.Signs and symptoms requring emergent/urgent reevaluation were discussed with the patient. Doxycycline refill for rosacea  Return in 6 months (on 12/03/2016) for Dr. Lyn Hollingshead Follow Up. Discussed with this patient that I will be resigning from my position here with [facility] in September in order to stay with my family who will be moving to [facility]. I let him know about the providers  that are still accepting patients and I feel that this individual will be under great care if he/she stays here with [facility].

## 2016-07-06 ENCOUNTER — Ambulatory Visit (INDEPENDENT_AMBULATORY_CARE_PROVIDER_SITE_OTHER): Payer: BLUE CROSS/BLUE SHIELD | Admitting: Osteopathic Medicine

## 2016-07-06 VITALS — BP 97/64 | HR 90 | Temp 97.4°F | Resp 16 | Ht 71.0 in | Wt 160.0 lb

## 2016-07-06 DIAGNOSIS — R1030 Lower abdominal pain, unspecified: Secondary | ICD-10-CM

## 2016-07-06 DIAGNOSIS — R1031 Right lower quadrant pain: Secondary | ICD-10-CM

## 2016-07-06 DIAGNOSIS — Z87438 Personal history of other diseases of male genital organs: Secondary | ICD-10-CM

## 2016-07-06 NOTE — Progress Notes (Signed)
HPI: Larry Mercado is a 35 y.o. male  who presents to [facility] today, 07/06/16,  for chief complaint of:  Chief Complaint  Patient presents with  . Groin Pain     . Context: no injury known, no hx hernia, previous hernia eval apparently negative (saw PCP and then unknown specialist ?surgeon? No records available, this was about 2013), hx hydrocele repair as 35yo, hx torsion which reversed itself without surgery at age 35yo.  . Location: R groin above testicle . Quality: soreness, feels like pulling sensation into abdomen or into leg . Severity: pt rates 3-4/10, more bothersome than painful  . Duration: 3 weeks . Timing: intermittent, worse with sitting.    Past medical, surgical, social and family history reviewed: Past Medical History:  Diagnosis Date  . Allergic rhinitis   . Dyslipidemia   . Dyspepsia   . GERD (gastroesophageal reflux disease)   . Groin pain, right lower quadrant   . IBS (irritable bowel syndrome)   . Sebaceous cyst    Past Surgical History:  Procedure Laterality Date  . HYDROCELE EXCISION / REPAIR    . MOUTH SURGERY    . SPERMATIC VEIN LIGATION     Social History  Substance Use Topics  . Smoking status: Never Smoker  . Smokeless tobacco: Not on file  . Alcohol use Yes   Family History  Problem Relation Age of Onset  . Cancer Father     brain  . Heart disease Paternal Grandfather   . Cancer Maternal Grandmother     mesothelioma     Current medication list and allergy/intolerance information reviewed:   Current Outpatient Prescriptions  Medication Sig Dispense Refill  . doxycycline (ORACEA) 40 MG capsule Take 40 mg by mouth every morning.    . lisdexamfetamine (VYVANSE) 20 MG capsule Take 20 mg by mouth daily.    . ARIPiprazole (ABILIFY) 2 MG tablet Take 2 tablets (4 mg total) by mouth daily. Follow up appointment required for future refills. 60 tablet 0  . niacin 500 MG tablet Take 1 tablet (500 mg total)  by mouth at bedtime. 30 tablet 2   No current facility-administered medications for this visit.    Allergies  Allergen Reactions  . Abilify [Aripiprazole]     Dizziness       Review of Systems:  Constitutional:  No  fever, no chills, No recent illness,  Cardiac: No  chest pain  Gastrointestinal: No  abdominal pain, No  nausea, No  vomiting  Genitourinary: No  incontinence, No  abnormal genital bleeding, No abnormal genital discharge, no testicular pain or swelling, no skin changes over groin/testicle  Skin: No  Rash, No other wounds/concerning lesions   Exam:  BP 97/64 (BP Location: Right Arm, Patient Position: Sitting, Cuff Size: Large)   Pulse 90   Temp 97.4 F (36.3 C) (Oral)   Resp 16   Ht 5\' 11"  (1.803 m)   Wt 160 lb (72.6 kg)   SpO2 100%   BMI 22.32 kg/m   Constitutional: VS see above. General Appearance: alert, well-developed, well-nourished, NAD  Neck: No masses, trachea midline.  Respiratory: Normal respiratory effort.   Gastrointestinal: lower abdomen Nontender, no masses.  GU: no hernia appreciated, no penile discharge, no testicular tenderness, R scrotum appears smaller/retracted, nontender.   Musculoskeletal: Gait normal..    Psychiatric: Normal judgment/insight. Normal mood and affect. Oriented x3.     ASSESSMENT/PLAN:   Given his unusal history of R groin/testicle procedures, previous eval  by ?general surgeon? And no clear exam diagnosis of hernia, I more suspect postsurgical pain or scarring/adhesions, not sure why would have gotten worse in past 3 weeks but I think he would be better served by a second opinion for urologist.   No red flags for torsion, no testicular mass/pain, ER precautions reviewed w/ regard to symptoms/signs of torsion  Right groin pain - Plan: Ambulatory referral to Urology  History of hydrocele - Plan: Ambulatory referral to Urology     Visit summary with medication list and pertinent instructions was printed for  patient to review. All questions at time of visit were answered - patient instructed to contact office with any additional concerns. ER/RTC precautions were reviewed with the patient. Follow-up plan: Return if symptoms worsen or fail to improve.

## 2016-07-14 ENCOUNTER — Telehealth: Payer: Self-pay

## 2016-07-14 NOTE — Telephone Encounter (Signed)
Larry DikeJennifer, I think you are the person coordinating referrals today? Anywhere is fine for him to be seen. If I didn't place this referral urgently, we can change the priority, I'd like to get him seen within the next 1-2 weeks

## 2016-07-14 NOTE — Telephone Encounter (Signed)
Patient called stated that the earliest appointment that he could get was late October early November. He wanted to know if he could be referred elsewhere. Please advise. Rhonda Cunningham,CMA

## 2016-07-19 NOTE — Telephone Encounter (Signed)
Victorino DikeJennifer can you check on this referral and please see note below. Yuliana Vandrunen,CMA

## 2016-07-21 DIAGNOSIS — Z8719 Personal history of other diseases of the digestive system: Secondary | ICD-10-CM | POA: Diagnosis not present

## 2016-07-21 DIAGNOSIS — R1033 Periumbilical pain: Secondary | ICD-10-CM | POA: Diagnosis not present

## 2016-07-21 DIAGNOSIS — Z8379 Family history of other diseases of the digestive system: Secondary | ICD-10-CM | POA: Diagnosis not present

## 2016-07-22 ENCOUNTER — Ambulatory Visit: Payer: BLUE CROSS/BLUE SHIELD | Admitting: Physician Assistant

## 2016-08-05 DIAGNOSIS — Z79899 Other long term (current) drug therapy: Secondary | ICD-10-CM | POA: Diagnosis not present

## 2016-08-05 DIAGNOSIS — F338 Other recurrent depressive disorders: Secondary | ICD-10-CM | POA: Diagnosis not present

## 2016-08-05 DIAGNOSIS — F902 Attention-deficit hyperactivity disorder, combined type: Secondary | ICD-10-CM | POA: Diagnosis not present

## 2016-08-05 DIAGNOSIS — F419 Anxiety disorder, unspecified: Secondary | ICD-10-CM | POA: Diagnosis not present

## 2016-08-24 DIAGNOSIS — Z6821 Body mass index (BMI) 21.0-21.9, adult: Secondary | ICD-10-CM | POA: Diagnosis not present

## 2016-08-24 DIAGNOSIS — R1030 Lower abdominal pain, unspecified: Secondary | ICD-10-CM | POA: Diagnosis not present

## 2016-08-24 DIAGNOSIS — N5082 Scrotal pain: Secondary | ICD-10-CM | POA: Diagnosis not present

## 2016-09-19 ENCOUNTER — Other Ambulatory Visit: Payer: Self-pay | Admitting: *Deleted

## 2016-09-19 MED ORDER — ARIPIPRAZOLE 2 MG PO TABS: 4.0000 mg | ORAL_TABLET | Freq: Every day | ORAL | 0 refills | Status: DC

## 2016-10-14 DIAGNOSIS — I861 Scrotal varices: Secondary | ICD-10-CM | POA: Diagnosis not present

## 2016-10-14 DIAGNOSIS — N5082 Scrotal pain: Secondary | ICD-10-CM | POA: Diagnosis not present

## 2016-12-02 ENCOUNTER — Ambulatory Visit: Payer: BLUE CROSS/BLUE SHIELD | Admitting: Osteopathic Medicine

## 2016-12-19 DIAGNOSIS — Z79899 Other long term (current) drug therapy: Secondary | ICD-10-CM | POA: Diagnosis not present

## 2016-12-19 DIAGNOSIS — F338 Other recurrent depressive disorders: Secondary | ICD-10-CM | POA: Diagnosis not present

## 2016-12-19 DIAGNOSIS — F419 Anxiety disorder, unspecified: Secondary | ICD-10-CM | POA: Diagnosis not present

## 2016-12-19 DIAGNOSIS — F902 Attention-deficit hyperactivity disorder, combined type: Secondary | ICD-10-CM | POA: Diagnosis not present

## 2017-01-02 ENCOUNTER — Ambulatory Visit (INDEPENDENT_AMBULATORY_CARE_PROVIDER_SITE_OTHER): Payer: BLUE CROSS/BLUE SHIELD

## 2017-01-02 ENCOUNTER — Ambulatory Visit (INDEPENDENT_AMBULATORY_CARE_PROVIDER_SITE_OTHER): Payer: BLUE CROSS/BLUE SHIELD | Admitting: Osteopathic Medicine

## 2017-01-02 ENCOUNTER — Encounter: Payer: Self-pay | Admitting: Osteopathic Medicine

## 2017-01-02 VITALS — BP 106/70 | HR 133 | Ht 71.0 in | Wt 151.0 lb

## 2017-01-02 DIAGNOSIS — G8929 Other chronic pain: Secondary | ICD-10-CM

## 2017-01-02 DIAGNOSIS — L719 Rosacea, unspecified: Secondary | ICD-10-CM

## 2017-01-02 DIAGNOSIS — M791 Myalgia: Secondary | ICD-10-CM

## 2017-01-02 DIAGNOSIS — E786 Lipoprotein deficiency: Secondary | ICD-10-CM

## 2017-01-02 DIAGNOSIS — R0609 Other forms of dyspnea: Secondary | ICD-10-CM | POA: Diagnosis not present

## 2017-01-02 DIAGNOSIS — R000 Tachycardia, unspecified: Secondary | ICD-10-CM

## 2017-01-02 DIAGNOSIS — M2550 Pain in unspecified joint: Secondary | ICD-10-CM | POA: Diagnosis not present

## 2017-01-02 DIAGNOSIS — R0600 Dyspnea, unspecified: Secondary | ICD-10-CM

## 2017-01-02 DIAGNOSIS — R0602 Shortness of breath: Secondary | ICD-10-CM

## 2017-01-02 DIAGNOSIS — M7918 Myalgia, other site: Secondary | ICD-10-CM

## 2017-01-02 DIAGNOSIS — F988 Other specified behavioral and emotional disorders with onset usually occurring in childhood and adolescence: Secondary | ICD-10-CM | POA: Diagnosis not present

## 2017-01-02 MED ORDER — AMPHETAMINE-DEXTROAMPHET ER 30 MG PO CP24: 30.0000 mg | ORAL_CAPSULE | ORAL | 0 refills | Status: DC

## 2017-01-02 NOTE — Patient Instructions (Addendum)
For breathing issues, let's get some additional labs and a chest Xray today. Follow-up on this in 1-2 weeks, we may need to do breathing tests for something like asthma.   For ADD: I am okay to refill the medication. We will have you sign a controlled substance agreement with this office and request records from Dr. Elisabeth MostStevenson   For rosacea: you should hear about a referral to dermatology in the next few days  For chronic aches/pains: ok to take Ibuprofen or Aleve as needed, take with food and water. These problems don't sound like any serious problem such as rheumatoid arthritis or other issue, but if no better with optimization of diet and exercise (some people report low-gluten or gluten-free diets help with their pain, and regular exercise certainly helps with pain and inflammation), then we might need to consider further workup.

## 2017-01-02 NOTE — Progress Notes (Signed)
HPI: Larry FinlayJeremy Mercado is a 36 y.o. male  who [facility] today, 01/02/17,  for chief complaint of:  Chief Complaint  Patient presents with  . Annual Exam     Rosacea: currently on Doxyxycline, previously on Metrogel, neither really effective. Occasionally has been on both at the same time.  Attention deficit disorder: Takes Adderall for ADHD, dose has been stable for some time, seeing [facility] for over a year. He would like to get Rx done here.   Tachycardia on vital signs: Patient denies chest pain, pressure, palpitations. No history of anxiety problems. Patient is on stimulants as noted above. He does note some history of shortness of breath on extension, this has been present for past several months, he is not on any particular diet or exercise regimen.  Chronic low back pain, pain in joints of hands. Previously seen a PT for this who was concerned about arthritis in the hip. Could feel hip popping. No known injury  FH low HDL, has been on niacin for this. Has some concerns about cardiac risk overall.    Past medical, surgical, social and family history reviewed: Patient Active Problem List   Diagnosis Date Noted  . Attention deficit hyperactivity disorder (ADHD) 12/14/2015  . Injury of left foot 10/14/2015  . SOB (shortness of breath) 02/25/2015  . Anxiety and depression 02/05/2015  . Low serum HDL 02/05/2015  . Rosacea 02/05/2015   Past Surgical History:  Procedure Laterality Date  . HYDROCELE EXCISION / REPAIR    . MOUTH SURGERY    . SPERMATIC VEIN LIGATION     Social History  Substance Use Topics  . Smoking status: Never Smoker  . Smokeless tobacco: Not on file  . Alcohol use Yes   Family History  Problem Relation Age of Onset  . Cancer Father     brain  . Heart disease Paternal Grandfather   . Cancer Maternal Grandmother     mesothelioma     Current medication list and allergy/intolerance  information reviewed:   Current Outpatient Prescriptions  Medication Sig Dispense Refill  . doxycycline (ORACEA) 40 MG capsule Take 40 mg by mouth every morning.    . niacin 500 MG tablet Take 1 tablet (500 mg total) by mouth at bedtime. 30 tablet 2  . amphetamine-dextroamphetamine (ADDERALL XR) 30 MG 24 hr capsule Take 30 mg by mouth daily.     No current facility-administered medications for this visit.    Allergies  Allergen Reactions  . Abilify [Aripiprazole]     Dizziness       Review of Systems:  Constitutional:  No  fever, no chills, No recent illness, No unintentional weight changes.   HEENT: No  headache, no vision change, no hearing change, No sore throat, No  sinus pressure  Cardiac: No  chest pain, No  pressure, No palpitations, No  Orthopnea  Respiratory:  + shortness of breath. No  Cough  Gastrointestinal: No  abdominal pain, No  nausea, No  vomiting,    Musculoskeletal: No new myalgia/arthralgia  Genitourinary: No  incontinence, No  abnormal genital bleeding, No abnormal genital discharge  Skin: No  Rash, No other wounds/concerning lesions  Hem/Onc: No  easy bruising/bleeding,   Neurologic: No  weakness, No  dizziness  Psychiatric: No  concerns with depression, No  concerns with anxiety, No sleep problems, No mood problems  Exam:  BP 106/70   Pulse (!) 133   Ht 5\' 11"  (1.803 m)  Wt 151 lb (68.5 kg)   BMI 21.06 kg/m   Constitutional: VS see above. General Appearance: alert, well-developed, well-nourished, NAD  Eyes: Normal lids and conjunctive, non-icteric sclera  Ears, Nose, Mouth, Throat: MMM, Normal external inspection ears/nares/mouth/lips/gums.  Neck: No masses, trachea midline. No thyroid enlargement. No tenderness/mass appreciated. No lymphadenopathy  Respiratory: Normal respiratory effort. no wheeze, no rhonchi, no rales  Cardiovascular: S1/S2 normal, no murmur, no rub/gallop auscultated. RRR. No lower extremity edema.    Gastrointestinal: Nontender, no masses. No hepatomegaly, no splenomegaly.   Musculoskeletal: Gait normal. No clubbing/cyanosis of digits. Normal range of motion finger/wrist, negative Tinel's bilaterally  Neurological: Normal balance/coordination. No tremor. No cranial nerve deficit on limited exam. Motor and sensation intact and symmetric. Cerebellar reflexes intact.   Skin: warm, dry, intact. No rash/ulcer with exception of some rosacea bilateral cheeks, mild. No concerning nevi or subq nodules on limited exam.    Psychiatric: Normal judgment/insight. Normal mood and affect. Oriented x3.   EKG interpretation: Rate: 90  Rhythm: sinus No ST/T changes concerning for acute ischemia/infarct    07/21/16 labs at [facility]: CBC ok CMP ok   05/18/15 lipid panel here: HDL low, otherwise ok   Cardiac risk calculated: With patient's current risk factors, even with low HDL he is very low risk for cardiac issues/ASCVD   ASSESSMENT/PLAN:   Rosacea - Plan: Ambulatory referral to Dermatology  Tachycardia - Plan: EKG 12-Lead, TSH  Attention deficit disorder (ADD) in adult - Plan: amphetamine-dextroamphetamine (ADDERALL XR) 30 MG 24 hr capsule  Chronic musculoskeletal pain  Arthralgia, unspecified joint - Plan: CBC with Differential/Platelet, COMPLETE METABOLIC PANEL WITH GFR, VITAMIN D 25 Hydroxy (Vit-D Deficiency, Fractures)  Dyspnea on exertion - Plan: CBC with Differential/Platelet, COMPLETE METABOLIC PANEL WITH GFR, VITAMIN D 25 Hydroxy (Vit-D Deficiency, Fractures), DG Chest 2 View  Low HDL (under 40) - Plan: Lipid panel    Patient Instructions  For breathing issues, let's get some additional labs and a chest Xray today. Follow-up on this in 1-2 weeks, we may need to do breathing tests for something like asthma.   For ADD: I am okay to refill the medication. We will have you sign a controlled substance agreement with this office and request records from Dr. Elisabeth Most   For rosacea:  you should hear about a referral to dermatology in the next few days  For chronic aches/pains: ok to take Ibuprofen or Aleve as needed, take with food and water. These problems don't sound like any serious problem such as rheumatoid arthritis or other issue, but if no better with optimization of diet and exercise (some people report low-gluten or gluten-free diets help with their pain, and regular exercise certainly helps with pain and inflammation), then we might need to consider further workup.     Visit summary with medication list and pertinent instructions was printed for patient to review. All questions at time of visit were answered - patient instructed to contact office with any additional concerns. ER/RTC precautions were reviewed with the patient. Follow-up plan: Return in about 2 weeks (around 01/16/2017) for RECHECK SYMPTOMS, ANNUAL PHYSICAL.  Note: Total time spent 40 minutes, greater than 50% of the visit was spent face-to-face counseling and coordinating care for the following: The primary encounter diagnosis was Rosacea. Diagnoses of Tachycardia, Attention deficit disorder (ADD) in adult, Chronic musculoskeletal pain, Arthralgia, unspecified joint, Dyspnea on exertion, and Low HDL (under 40) were also pertinent to this visit.Marland Kitchen

## 2017-01-03 DIAGNOSIS — M79642 Pain in left hand: Secondary | ICD-10-CM

## 2017-01-03 DIAGNOSIS — F988 Other specified behavioral and emotional disorders with onset usually occurring in childhood and adolescence: Secondary | ICD-10-CM | POA: Insufficient documentation

## 2017-01-03 DIAGNOSIS — M79641 Pain in right hand: Secondary | ICD-10-CM | POA: Insufficient documentation

## 2017-01-03 DIAGNOSIS — R000 Tachycardia, unspecified: Secondary | ICD-10-CM | POA: Insufficient documentation

## 2017-01-03 HISTORY — DX: Pain in left hand: M79.642

## 2017-01-03 LAB — CBC WITH DIFFERENTIAL/PLATELET
BASOS PCT: 0 %
Basophils Absolute: 0 cells/uL (ref 0–200)
EOS ABS: 67 {cells}/uL (ref 15–500)
Eosinophils Relative: 1 %
HEMATOCRIT: 46.4 % (ref 38.5–50.0)
Hemoglobin: 15.9 g/dL (ref 13.2–17.1)
LYMPHS PCT: 26 %
Lymphs Abs: 1742 cells/uL (ref 850–3900)
MCH: 30.6 pg (ref 27.0–33.0)
MCHC: 34.3 g/dL (ref 32.0–36.0)
MCV: 89.4 fL (ref 80.0–100.0)
MONO ABS: 603 {cells}/uL (ref 200–950)
MONOS PCT: 9 %
MPV: 10.4 fL (ref 7.5–12.5)
NEUTROS PCT: 64 %
Neutro Abs: 4288 cells/uL (ref 1500–7800)
PLATELETS: 204 10*3/uL (ref 140–400)
RBC: 5.19 MIL/uL (ref 4.20–5.80)
RDW: 13.2 % (ref 11.0–15.0)
WBC: 6.7 10*3/uL (ref 3.8–10.8)

## 2017-01-03 LAB — LIPID PANEL
CHOL/HDL RATIO: 3 ratio (ref <5.0)
CHOLESTEROL: 124 mg/dL (ref <200)
HDL: 41 mg/dL (ref >40)
LDL CALC: 71 mg/dL (ref <100)
TRIGLYCERIDES: 58 mg/dL (ref <150)
VLDL: 12 mg/dL (ref <30)

## 2017-01-03 LAB — COMPLETE METABOLIC PANEL WITH GFR
ALT: 19 U/L (ref 9–46)
AST: 24 U/L (ref 10–40)
Albumin: 4.4 g/dL (ref 3.6–5.1)
Alkaline Phosphatase: 75 U/L (ref 40–115)
BUN: 11 mg/dL (ref 7–25)
CALCIUM: 9.7 mg/dL (ref 8.6–10.3)
CHLORIDE: 103 mmol/L (ref 98–110)
CO2: 32 mmol/L — AB (ref 20–31)
Creat: 0.9 mg/dL (ref 0.60–1.35)
Glucose, Bld: 90 mg/dL (ref 65–99)
POTASSIUM: 3.9 mmol/L (ref 3.5–5.3)
Sodium: 141 mmol/L (ref 135–146)
Total Bilirubin: 0.6 mg/dL (ref 0.2–1.2)
Total Protein: 7.1 g/dL (ref 6.1–8.1)

## 2017-01-03 LAB — VITAMIN D 25 HYDROXY (VIT D DEFICIENCY, FRACTURES): VIT D 25 HYDROXY: 32 ng/mL (ref 30–100)

## 2017-01-03 LAB — TSH: TSH: 0.65 mIU/L (ref 0.40–4.50)

## 2017-01-13 ENCOUNTER — Encounter: Payer: BLUE CROSS/BLUE SHIELD | Admitting: Osteopathic Medicine

## 2017-01-18 ENCOUNTER — Telehealth: Payer: Self-pay | Admitting: Osteopathic Medicine

## 2017-01-18 NOTE — Telephone Encounter (Signed)
Pt called clinic to see if there is another ADD Rx he could try. Pt reports the ADDERALL XR works well but is making his "heart race." Will route to PCP for review.

## 2017-01-20 ENCOUNTER — Telehealth: Payer: Self-pay

## 2017-01-20 DIAGNOSIS — L218 Other seborrheic dermatitis: Secondary | ICD-10-CM | POA: Diagnosis not present

## 2017-01-20 MED ORDER — ATOMOXETINE HCL 40 MG PO CAPS: 40.0000 mg | ORAL_CAPSULE | Freq: Every day | ORAL | 1 refills | Status: DC

## 2017-01-20 NOTE — Telephone Encounter (Signed)
Pt spoke with regarding Strattera, He is willing to try it.  He called his insurance company and they will cover it.

## 2017-01-20 NOTE — Telephone Encounter (Signed)
Any stimulant will have this possible side effect. We can try decreasing the dose of current medication or switching to a non-stimulant such as Strattera but I'd advise he call his insurance to see if Larry DurieStrattera is covered.

## 2017-01-20 NOTE — Telephone Encounter (Signed)
Pt stated that he has called several times this week regarding his adderal.  [facility]

## 2017-01-20 NOTE — Telephone Encounter (Signed)
Medication sent to pharmacy on file. Patient should plan to follow up in the office for recheck ADHD in 4 weeks, sooner if needed.

## 2017-01-20 NOTE — Telephone Encounter (Signed)
Left message with information and to call back with decision or any questions.

## 2017-01-20 NOTE — Telephone Encounter (Signed)
See other encounter.

## 2017-01-23 NOTE — Telephone Encounter (Signed)
LM for patient regarding medication

## 2017-01-24 ENCOUNTER — Telehealth: Payer: Self-pay

## 2017-01-24 NOTE — Telephone Encounter (Signed)
Pre Authorization was sent to Cover My Meds. Med was approved. KeyMicheline Rough: [id number] - [place] Case : [id number]: [id number]. Called pharmacy and verified approval. Pt informed.

## 2017-01-26 NOTE — Telephone Encounter (Signed)
Atomoxetine has been approved by the insurance. From 02/25/208-03/26/202a1..message left on pharm vm an patient vm

## 2017-02-22 ENCOUNTER — Ambulatory Visit (INDEPENDENT_AMBULATORY_CARE_PROVIDER_SITE_OTHER): Payer: BLUE CROSS/BLUE SHIELD | Admitting: Osteopathic Medicine

## 2017-02-22 ENCOUNTER — Encounter: Payer: Self-pay | Admitting: Osteopathic Medicine

## 2017-02-22 VITALS — BP 115/67 | HR 79 | Temp 97.5°F | Ht 71.0 in | Wt 153.0 lb

## 2017-02-22 DIAGNOSIS — B9789 Other viral agents as the cause of diseases classified elsewhere: Secondary | ICD-10-CM | POA: Diagnosis not present

## 2017-02-22 DIAGNOSIS — M2550 Pain in unspecified joint: Secondary | ICD-10-CM | POA: Diagnosis not present

## 2017-02-22 DIAGNOSIS — J069 Acute upper respiratory infection, unspecified: Secondary | ICD-10-CM | POA: Diagnosis not present

## 2017-02-22 DIAGNOSIS — F988 Other specified behavioral and emotional disorders with onset usually occurring in childhood and adolescence: Secondary | ICD-10-CM | POA: Diagnosis not present

## 2017-02-22 MED ORDER — IPRATROPIUM BROMIDE 0.03 % NA SOLN: 2.0000 | Freq: Four times a day (QID) | NASAL | 0 refills | Status: DC | PRN

## 2017-02-22 MED ORDER — AMPHETAMINE-DEXTROAMPHET ER 20 MG PO CP24: 20.0000 mg | ORAL_CAPSULE | ORAL | 0 refills | Status: DC

## 2017-02-22 MED ORDER — MELOXICAM 7.5 MG PO TABS: 7.5000 mg | ORAL_TABLET | Freq: Every day | ORAL | 1 refills | Status: DC

## 2017-02-22 NOTE — Progress Notes (Signed)
HPI: Larry Mercado is a 36 y.o. male  who presents to [place] today, 02/22/17,  for chief complaint of:  Chief Complaint  Patient presents with  . Cough    Presumed upper respiratory viral illness: Patient has been experiencing cough and sinus congestion 3 days, over-the-counter ibuprofen has been somewhat helpful for aches/pains, overall is feeling a bit better today  Attention deficit disorder: Patient wanted to try non-stimulant medication for some time since he was having some elevated heart rate on stimulants. He has been alternating back and forth between Adderall and Strattera. Feels very sleepy when he takes this Strattera. Would like to try lower dose of stimulant medication if possible.  Joint pain: Reports pain in his hands, concerned about occasional cracking noise in the fingers, spends most of his stay on the computer typing, does have ergonomic wrist pad for this. No morning stiffness, no rashes or other skin or mucous membrane changes. Has not been particularly amenable to over-the-counter medications   Past medical history, surgical history, social history and family history reviewed.  Patient Active Problem List   Diagnosis Date Noted  . Arthralgia 01/03/2017  . Attention deficit disorder (ADD) in adult 01/03/2017  . Tachycardia 01/03/2017  . Attention deficit hyperactivity disorder (ADHD) 12/14/2015  . Injury of left foot 10/14/2015  . Dyspnea on exertion 02/25/2015  . Anxiety and depression 02/05/2015  . Low serum HDL 02/05/2015  . Rosacea 02/05/2015    Current medication list and allergy/intolerance information reviewed.   Current Outpatient Prescriptions on File Prior to Visit  Medication Sig Dispense Refill  . doxycycline (ORACEA) 40 MG capsule Take 40 mg by mouth every morning.    . niacin 500 MG tablet Take 1 tablet (500 mg total) by mouth at bedtime. 30 tablet 2   No current facility-administered medications on file  prior to visit.    Allergies  Allergen Reactions  . Abilify [Aripiprazole]     Dizziness       Review of Systems:  Constitutional: +recent illness  HEENT: +Sinus congestion/headache, no vision change  Cardiac: No  chest pain, No  pressure, No palpitations  Respiratory:  No  shortness of breath. +Cough  Gastrointestinal: No  abdominal pain, no change on bowel habits  Musculoskeletal: No new myalgia/arthralgia - chronic pain as per history of present illness  Skin: No  Rash  Neurologic: No  weakness, No  Dizziness   Exam:  BP 115/67   Pulse 79   Temp 97.5 F (36.4 C) (Oral)   Ht '5\' 11"'  (1.803 m)   Wt 153 lb (69.4 kg)   BMI 21.34 kg/m   Constitutional: VS see above. General Appearance: alert, well-developed, well-nourished, NAD  Eyes: Normal lids and conjunctive, non-icteric sclera  Ears, Nose, Mouth, Throat: MMM, Normal external inspection ears/nares/mouth/lips/gums.  Neck: No masses, trachea midline.   Respiratory: Normal respiratory effort. no wheeze, no rhonchi, no rales  Cardiovascular: S1/S2 normal, no murmur, no rub/gallop auscultated. RRR.   Musculoskeletal: Gait normal. Symmetric and independent movement of all extremities  Neurological: Normal balance/coordination. No tremor.  Skin: warm, dry, intact.   Psychiatric: Normal judgment/insight. Normal mood and affect. Oriented x3.    Recent Results (from the past 2160 hour(s))  CBC with Differential/Platelet     Status: None   Collection Time: 01/02/17  4:51 PM  Result Value Ref Range   WBC 6.7 3.8 - 10.8 K/uL   RBC 5.19 4.20 - 5.80 MIL/uL   Hemoglobin 15.9 13.2 -  17.1 g/dL   HCT 46.4 38.5 - 50.0 %   MCV 89.4 80.0 - 100.0 fL   MCH 30.6 27.0 - 33.0 pg   MCHC 34.3 32.0 - 36.0 g/dL   RDW 13.2 11.0 - 15.0 %   Platelets 204 140 - 400 K/uL   MPV 10.4 7.5 - 12.5 fL   Neutro Abs 4,288 1,500 - 7,800 cells/uL   Lymphs Abs 1,742 850 - 3,900 cells/uL   Monocytes Absolute 603 200 - 950 cells/uL    Eosinophils Absolute 67 15 - 500 cells/uL   Basophils Absolute 0 0 - 200 cells/uL   Neutrophils Relative % 64 %   Lymphocytes Relative 26 %   Monocytes Relative 9 %   Eosinophils Relative 1 %   Basophils Relative 0 %   Smear Review Criteria for review not met   COMPLETE METABOLIC PANEL WITH GFR     Status: Abnormal   Collection Time: 01/02/17  4:51 PM  Result Value Ref Range   Sodium 141 135 - 146 mmol/L   Potassium 3.9 3.5 - 5.3 mmol/L   Chloride 103 98 - 110 mmol/L   CO2 32 (H) 20 - 31 mmol/L   Glucose, Bld 90 65 - 99 mg/dL   BUN 11 7 - 25 mg/dL   Creat 0.90 0.60 - 1.35 mg/dL   Total Bilirubin 0.6 0.2 - 1.2 mg/dL   Alkaline Phosphatase 75 40 - 115 U/L   AST 24 10 - 40 U/L   ALT 19 9 - 46 U/L   Total Protein 7.1 6.1 - 8.1 g/dL   Albumin 4.4 3.6 - 5.1 g/dL   Calcium 9.7 8.6 - 10.3 mg/dL   GFR, Est African American >89 >=60 mL/min   GFR, Est Non African American >89 >=60 mL/min  Lipid panel     Status: None   Collection Time: 01/02/17  4:51 PM  Result Value Ref Range   Cholesterol 124 <200 mg/dL   Triglycerides 58 <150 mg/dL   HDL 41 >40 mg/dL   Total CHOL/HDL Ratio 3.0 <5.0 Ratio   VLDL 12 <30 mg/dL   LDL Cholesterol 71 <100 mg/dL  TSH     Status: None   Collection Time: 01/02/17  4:51 PM  Result Value Ref Range   TSH 0.65 0.40 - 4.50 mIU/L  VITAMIN D 25 Hydroxy (Vit-D Deficiency, Fractures)     Status: None   Collection Time: 01/02/17  4:51 PM  Result Value Ref Range   Vit D, 25-Hydroxy 32 30 - 100 ng/mL    Comment: Vitamin D Status           25-OH Vitamin D        Deficiency                <20 ng/mL        Insufficiency         20 - 29 ng/mL        Optimal             > or = 30 ng/mL   For 25-OH Vitamin D testing on patients on D2-supplementation and patients for whom quantitation of D2 and D3 fractions is required, the QuestAssureD 25-OH VIT D, (D2,D3), LC/MS/MS is recommended: order code [phone] (patients > 2 yrs).      ASSESSMENT/PLAN:   Attention deficit  disorder (ADD) in adult - We'll try decreasing dose of Adderall prescription. Discontinue Strattera - doing well on this okay to refill, if not follow-up in the  office  Viral URI with cough - Supportive care advised, Atrovent prescription sent for sinus congestion, call us if no better  Arthralgia, unspecified joint - No alarm symptoms for rheumatoid or other unusual process, would get inflammatory markers when acute illness has resolved, follow-up 4-6 weeks for this    Patient Instructions  Plan: 1. If lowered dose Adderall is helping, let us know and I can arrange refills. At that point, plan to follow-up in the office every 3 months per our policy regarding all patients for whom we are prescribing stimulant medications.  2. If considering lowering dose or changing medication, come see me in the office for further discussion in one month  3. Nasal spray prescription called to pharmacy for viral illness, see below for list of other OTC/home treatments, call us if no better one week after initial symptoms    Over-the-Counter Medications & Home Remedies for Upper Respiratory Illness  Note: the following list assumes no pregnancy, normal liver & kidney function and no other drug interactions. Dr. Sheppard Coil has highlighted medications which are safe for you to use, but these may not be appropriate for everyone. Always ask a pharmacist or qualified medical provider if you have any questions!   Aches/Pains, Fever, Headache Acetaminophen (Tylenol) 500 mg tablets - take max 2 tablets (1000 mg) every 6 hours (4 times per day)  Ibuprofen (Motrin) 200 mg tablets - take max 4 tablets (800 mg) every 6 hours*  Sinus Congestion Prescription Atrovent as directed Cromolyn Nasal Spray (NasalCrom) 1 spray each nostril 3-4 times per day, max 6 imes per day Nasal Saline if desired Oxymetolazone (Afrin, others) sparing use due to rebound congestion, NEVER use in kids Phenylephrine (Sudafed) 10 mg tablets  every 4 hours (or the 12-hour formulation)* Diphenhydramine (Benadryl) 25 mg tablets - take max 2 tablets every 4 hours  Cough & Sore Throat Prescription cough pills or syrups as directed Dextromethorphan (Robitussin, others) - cough suppressant Guaifenesin (Robitussin, Mucinex, others) - expectorant (helps cough up mucus) (Dextromethorphan and Guaifenesin also come in a combination tablet) Lozenges w/ Benzocaine + Menthol (Cepacol) Honey - as much as you want! Teas which "coat the throat" - look for ingredients Elm Bark, Licorice Root, Marshmallow Root  Other Antibiotics if these are prescribed - take ALL, even if you're feeling better  Zinc Lozenges within 24 hours of symptoms onset - mixed evidence this shortens the duration of the common cold Don't waste your money on Vitamin C or Echinacea  *Caution in patients with high blood pressure       Follow-up plan: Return in about 4 weeks (around 03/22/2017) for if would like alternative ADD meds/alter dose, otherwisein 3 months for refill of stimulant/ADD Rx.  Visit summary with medication list and pertinent instructions was printed for patient to review, alert [place] if any changes needed. All questions at time of visit were answered - patient instructed to contact office with any additional concerns. ER/RTC precautions were reviewed with the patient and understanding verbalized.   Note: Total time spent 25 minutes, greater than 50% of the visit was spent face-to-face counseling and coordinating care for the following: The primary encounter diagnosis was Attention deficit disorder (ADD) in adult. Diagnoses of Viral URI with cough and Arthralgia, unspecified joint were also pertinent to this visit.Marland Kitchen

## 2017-02-22 NOTE — Patient Instructions (Addendum)
Plan: 1. If lowered dose Adderall is helping, let us know and I can arrange refills. At that point, plan to follow-up in the office every 3 months per our policy regarding all patients for whom we are prescribing stimulant medications.  2. If considering lowering dose or changing medication, come see me in the office for further discussion in one month  3. Nasal spray prescription called to pharmacy for viral illness, see below for list of other OTC/home treatments, call us if no better one week after initial symptoms    Over-the-Counter Medications & Home Remedies for Upper Respiratory Illness  Note: the following list assumes no pregnancy, normal liver & kidney function and no other drug interactions. Dr. Lyn Hollingshead has highlighted medications which are safe for you to use, but these may not be appropriate for everyone. Always ask a pharmacist or qualified medical provider if you have any questions!   Aches/Pains, Fever, Headache Acetaminophen (Tylenol) 500 mg tablets - take max 2 tablets (1000 mg) every 6 hours (4 times per day)  Ibuprofen (Motrin) 200 mg tablets - take max 4 tablets (800 mg) every 6 hours*  Sinus Congestion Prescription Atrovent as directed Cromolyn Nasal Spray (NasalCrom) 1 spray each nostril 3-4 times per day, max 6 imes per day Nasal Saline if desired Oxymetolazone (Afrin, others) sparing use due to rebound congestion, NEVER use in kids Phenylephrine (Sudafed) 10 mg tablets every 4 hours (or the 12-hour formulation)* Diphenhydramine (Benadryl) 25 mg tablets - take max 2 tablets every 4 hours  Cough & Sore Throat Prescription cough pills or syrups as directed Dextromethorphan (Robitussin, others) - cough suppressant Guaifenesin (Robitussin, Mucinex, others) - expectorant (helps cough up mucus) (Dextromethorphan and Guaifenesin also come in a combination tablet) Lozenges w/ Benzocaine + Menthol (Cepacol) Honey - as much as you want! Teas which "coat the throat" -  look for ingredients Elm Bark, Licorice Root, Marshmallow Root  Other Antibiotics if these are prescribed - take ALL, even if you're feeling better  Zinc Lozenges within 24 hours of symptoms onset - mixed evidence this shortens the duration of the common cold Don't waste your money on Vitamin C or Echinacea  *Caution in patients with high blood pressure

## 2017-03-13 ENCOUNTER — Telehealth: Payer: Self-pay

## 2017-03-13 NOTE — Telephone Encounter (Signed)
Patient called requested a referral for a hand specialist for joint pain in his hand. He stated that he is taking Meloxicam and it is not working. Please advise. Rhonda Cunningham,CMA

## 2017-03-14 NOTE — Telephone Encounter (Signed)
LEFT DETAILED MESSAGE ON PATIENT VM WITH RECOMMENDATIONS AS NOTED BELOW. ADVISED PATIENT TO CALL AND SCHEDULE AND IF HE HAD FURTHER QUESTIONS TO CALL THE OFFICE. Arie Gable,CMA

## 2017-03-14 NOTE — Telephone Encounter (Signed)
Would probably recommend that he see one of our sports medicine physicians first to determine if there is anything they can do or if they think he should see a Careers advisersurgeon. Would also recommend further testing for unusual causes of joint pain like rheumatoid arthritis. I think sports medicine would be a good first step prior to sending him to a specialist. Please ask him to follow-up with Dr. Denyse Amassorey or Dr. Karie Schwalbe

## 2017-03-24 ENCOUNTER — Ambulatory Visit (INDEPENDENT_AMBULATORY_CARE_PROVIDER_SITE_OTHER): Payer: BLUE CROSS/BLUE SHIELD | Admitting: Sports Medicine

## 2017-03-24 ENCOUNTER — Encounter: Payer: Self-pay | Admitting: Sports Medicine

## 2017-03-24 ENCOUNTER — Ambulatory Visit (INDEPENDENT_AMBULATORY_CARE_PROVIDER_SITE_OTHER): Payer: BLUE CROSS/BLUE SHIELD

## 2017-03-24 DIAGNOSIS — M79642 Pain in left hand: Secondary | ICD-10-CM | POA: Diagnosis not present

## 2017-03-24 DIAGNOSIS — M79641 Pain in right hand: Secondary | ICD-10-CM | POA: Diagnosis not present

## 2017-03-24 DIAGNOSIS — L218 Other seborrheic dermatitis: Secondary | ICD-10-CM | POA: Diagnosis not present

## 2017-03-24 DIAGNOSIS — L718 Other rosacea: Secondary | ICD-10-CM | POA: Diagnosis not present

## 2017-03-24 MED ORDER — DICLOFENAC SODIUM 75 MG PO TBEC: 75.0000 mg | DELAYED_RELEASE_TABLET | Freq: Two times a day (BID) | ORAL | 3 refills | Status: AC

## 2017-03-24 NOTE — Assessment & Plan Note (Signed)
Pain at the PIPs and wrist joints, consistent with overuse synovitis. No morning stiffness or gelling to suggest osteoarthritis or rheumatoid arthritis however considering distribution and failure of meloxicam we are going to obtain bilateral x-rays as well as arthritis/rheumatoid panels.  Discontinue meloxicam, switching to Voltaren, I've also asked him to get some snug gloves that go only past the PIPs. Return in 2 weeks, we will consider bilateral hand MRI if no better.

## 2017-03-24 NOTE — Progress Notes (Signed)
   Subjective:    I'm seeing this patient as a consultation for:  Dr. 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 No trauma, he doesn't have any morning stiffness, no gelling. When he is not working his hands feel good. He has done his best to improve ergonomics on his keyboard with a wrist pad as well as a wrist pad for his mouse. He recently built some playground equipment for his kids, and had no pain doing this. Ultimately he has no family history of rheumatoid arthritis, but has never been tested or imaged. Pain is moderate, persistent, localized without radiation, no constitutional symptoms.  Past medical history:  Negative.  See flowsheet/record as well for more information.  Surgical history: Negative.  See flowsheet/record as well for more information.  Family history: Negative.  See flowsheet/record as well for more information.  Social history: Negative.  See flowsheet/record as well for more information.  Allergies, and medications have been entered into the medical record, reviewed, and no changes needed.   Review of Systems: No headache, visual changes, nausea, vomiting, diarrhea, constipation, dizziness, abdominal pain, skin rash, fevers, chills, night sweats, weight loss, swollen lymph nodes, body aches, joint swelling, muscle aches, chest pain, shortness of breath, mood changes, visual or auditory hallucinations.   Objective:   General: Well Developed, well nourished, and in no acute distress.  Neuro/Psych: Alert and oriented x3, extra-ocular muscles intact, able to move all 4 extremities, sensation grossly intact. Skin: Warm and dry, no rashes noted.  Respiratory: Not using accessory muscles, speaking in full sentences, trachea midline.  Cardiovascular:  Pulses palpable, no extremity edema. Abdomen: Does not appear distended. Hands: Minimal synovitis with swelling at the proximal interphalangeal joint, wrist exam is unremarkable. No visible rheumatoid nodules. Exam is overall benign and I really can't reproduce pain on palpation.  Impression and Recommendations:   This case required medical decision making of moderate complexity.  Bilateral hand pain Pain at the PIPs and wrist joints, consistent with overuse synovitis. No morning stiffness or gelling to suggest osteoarthritis or rheumatoid arthritis however considering distribution and failure of meloxicam we are going to obtain bilateral x-rays as well as arthritis/rheumatoid panels.  Discontinue meloxicam, switching to Voltaren, I've also asked him to get some snug gloves that go only past the PIPs. Return in 2 weeks, we will consider bilateral hand MRI if no better.

## 2017-03-25 LAB — RHEUMATOID FACTOR: Rheumatoid fact SerPl-aCnc: <14 [IU]/mL (ref <14)

## 2017-03-25 LAB — SEDIMENTATION RATE: Sed Rate: 1 mm/h (ref 0–15)

## 2017-03-25 LAB — CK: Total CK: 87 U/L (ref 44–196)

## 2017-03-25 LAB — URIC ACID: Uric Acid, Serum: 5.2 mg/dL (ref 4.0–8.0)

## 2017-03-28 ENCOUNTER — Encounter: Payer: BLUE CROSS/BLUE SHIELD | Admitting: Family Medicine

## 2017-03-28 LAB — CYCLIC CITRUL PEPTIDE ANTIBODY, IGG: Cyclic Citrullin Peptide Ab: <16 U

## 2017-03-28 LAB — ANA: Anti Nuclear Antibody(ANA): NEGATIVE

## 2017-04-05 ENCOUNTER — Telehealth: Payer: Self-pay

## 2017-04-05 MED ORDER — AMPHETAMINE-DEXTROAMPHET ER 20 MG PO CP24: 20.0000 mg | ORAL_CAPSULE | ORAL | 0 refills | Status: DC

## 2017-04-05 NOTE — Telephone Encounter (Signed)
Patient called requested a refill for Adderall. Tezra Mahr,CMA

## 2017-04-14 ENCOUNTER — Ambulatory Visit (INDEPENDENT_AMBULATORY_CARE_PROVIDER_SITE_OTHER): Payer: BLUE CROSS/BLUE SHIELD

## 2017-04-14 ENCOUNTER — Ambulatory Visit (INDEPENDENT_AMBULATORY_CARE_PROVIDER_SITE_OTHER): Payer: BLUE CROSS/BLUE SHIELD | Admitting: Sports Medicine

## 2017-04-14 DIAGNOSIS — M545 Low back pain, unspecified: Secondary | ICD-10-CM

## 2017-04-14 DIAGNOSIS — M5136 Other intervertebral disc degeneration, lumbar region: Secondary | ICD-10-CM | POA: Insufficient documentation

## 2017-04-14 DIAGNOSIS — G8929 Other chronic pain: Secondary | ICD-10-CM

## 2017-04-14 DIAGNOSIS — M79641 Pain in right hand: Secondary | ICD-10-CM | POA: Diagnosis not present

## 2017-04-14 DIAGNOSIS — M79642 Pain in left hand: Secondary | ICD-10-CM

## 2017-04-14 DIAGNOSIS — M51369 Other intervertebral disc degeneration, lumbar region without mention of lumbar back pain or lower extremity pain: Secondary | ICD-10-CM | POA: Insufficient documentation

## 2017-04-14 MED ORDER — TRAMADOL HCL 50 MG PO TABS: ORAL_TABLET | ORAL | 0 refills | Status: DC

## 2017-04-14 MED ORDER — PREDNISONE 50 MG PO TABS: ORAL_TABLET | ORAL | 0 refills | Status: DC

## 2017-04-14 NOTE — Assessment & Plan Note (Signed)
Axial and discogenic low back pain. Prednisone, home rehabilitation exercises, x-rays. Return in one month for this.

## 2017-04-14 NOTE — Progress Notes (Signed)
   Subjective:    I'm seeing this patient as a consultation for:  Dr. Sunnie NielsenNatalie Alexander  CC: Low back pain  HPI: For years this 36 year old male who works in a seated position and had pain in his low back, axial, worse with sitting, flexion, Valsalva, nothing radicular, no bowel or bladder distention, saddle numbness, constitutional symptoms.   He's also been seeing me for bilateral hand pain, mostly at the PIPs. Entire rheumatoid workup was negative and x-rays were negative. Voltaren provided minimal relief.  Past medical history:  Negative.  See flowsheet/record as well for more information.  Surgical history: Negative.  See flowsheet/record as well for more information.  Family history: Negative.  See flowsheet/record as well for more information.  Social history: Negative.  See flowsheet/record as well for more information.  Allergies, and medications have been entered into the medical record, reviewed, and no changes needed.   Review of Systems: No headache, visual changes, nausea, vomiting, diarrhea, constipation, dizziness, abdominal pain, skin rash, fevers, chills, night sweats, weight loss, swollen lymph nodes, body aches, joint swelling, muscle aches, chest pain, shortness of breath, mood changes, visual or auditory hallucinations.   Objective:   General: Well Developed, well nourished, and in no acute distress.  Neuro/Psych: Alert and oriented x3, extra-ocular muscles intact, able to move all 4 extremities, sensation grossly intact. Skin: Warm and dry, no rashes noted.  Respiratory: Not using accessory muscles, speaking in full sentences, trachea midline.  Cardiovascular: Pulses palpable, no extremity edema. Abdomen: Does not appear distended. Back Exam:  Inspection: Unremarkable  Motion: Flexion 45 deg, Extension 45 deg, Side Bending to 45 deg bilaterally,  Rotation to 45 deg bilaterally  SLR laying: Negative  XSLR laying: Negative  Palpable tenderness: None. FABER:  negative. Sensory change: Gross sensation intact to all lumbar and sacral dermatomes.  Reflexes: 2+ at both patellar tendons, 2+ at achilles tendons, Babinski's downgoing.  Strength at foot  Plantar-flexion: 5/5 Dorsi-flexion: 5/5 Eversion: 5/5 Inversion: 5/5  Leg strength  Quad: 5/5 Hamstring: 5/5 Hip flexor: 5/5 Hip abductors: 5/5  Gait unremarkable.  X-rays reviewed and show disc height loss at L5-S1.  Impression and Recommendations:   This case required medical decision making of moderate complexity.  Bilateral hand pain Persistent and at the PIPs, only slightly better with the diclofenac. Still suspect osteoarthritis, so that we don't leave any stone unturned I'm going to proceed with bilateral hand MRIs as well as nerve conduction/EMG of both upper extremities. Tramadol for breakthrough pain. Entire rheumatoid workup was unremarkable.  Low back pain Axial and discogenic low back pain. Prednisone, home rehabilitation exercises, x-rays. Return in one month for this.

## 2017-04-14 NOTE — Assessment & Plan Note (Addendum)
Persistent and at the PIPs, only slightly better with the diclofenac. Still suspect osteoarthritis, so that we don't leave any stone unturned I'm going to proceed with bilateral hand MRIs as well as nerve conduction/EMG of both upper extremities. Tramadol for breakthrough pain. Entire rheumatoid workup was unremarkable.

## 2017-04-26 ENCOUNTER — Telehealth: Payer: Self-pay | Admitting: Osteopathic Medicine

## 2017-04-26 DIAGNOSIS — M79642 Pain in left hand: Principal | ICD-10-CM

## 2017-04-26 DIAGNOSIS — M79641 Pain in right hand: Secondary | ICD-10-CM

## 2017-04-26 MED ORDER — TRAMADOL HCL 50 MG PO TABS: ORAL_TABLET | ORAL | 0 refills | Status: DC

## 2017-04-26 NOTE — Telephone Encounter (Signed)
Pt called. He is almost out of Tramadol and wantts to know if he can get a refill. He has scheduled an appointment to come in.  Thank you.

## 2017-04-26 NOTE — Telephone Encounter (Signed)
That's fine. Rx in box.

## 2017-05-05 ENCOUNTER — Encounter: Payer: Self-pay | Admitting: Sports Medicine

## 2017-05-05 ENCOUNTER — Ambulatory Visit (INDEPENDENT_AMBULATORY_CARE_PROVIDER_SITE_OTHER): Payer: BLUE CROSS/BLUE SHIELD | Admitting: Sports Medicine

## 2017-05-05 ENCOUNTER — Ambulatory Visit: Payer: BLUE CROSS/BLUE SHIELD | Admitting: Sports Medicine

## 2017-05-05 DIAGNOSIS — M79642 Pain in left hand: Secondary | ICD-10-CM

## 2017-05-05 DIAGNOSIS — M79641 Pain in right hand: Secondary | ICD-10-CM

## 2017-05-05 DIAGNOSIS — M5136 Other intervertebral disc degeneration, lumbar region: Secondary | ICD-10-CM

## 2017-05-05 DIAGNOSIS — M51369 Other intervertebral disc degeneration, lumbar region without mention of lumbar back pain or lower extremity pain: Secondary | ICD-10-CM

## 2017-05-05 NOTE — Patient Instructions (Signed)
Call [phone] to reschedule MRI hands at [facility] on saturday

## 2017-05-05 NOTE — Progress Notes (Signed)
  Subjective:    CC: Follow-up  HPI: Hand pain: Still awaiting MRI and bilateral nerve conduction/EMG. He would like to switch his MRI to [facility] on Saturday. He has not yet gotten a call back from neurology.  Low back pain: X-rays did show L5-S1 degenerative changes, he has done well with home rehabilitation exercises, in the initial dose of prednisone, tramadol was not that effective. No bowel or bladder dysfunction, saddle numbness, no constitutional symptoms, nothing radicular.  Past medical history:  Negative.  See flowsheet/record as well for more information.  Surgical history: Negative.  See flowsheet/record as well for more information.  Family history: Negative.  See flowsheet/record as well for more information.  Social history: Negative.  See flowsheet/record as well for more information.  Allergies, and medications have been entered into the medical record, reviewed, and no changes needed.   Review of Systems: No fevers, chills, night sweats, weight loss, chest pain, or shortness of breath.   Objective:    General: Well Developed, well nourished, and in no acute distress.  Neuro: Alert and oriented x3, extra-ocular muscles intact, sensation grossly intact.  HEENT: Normocephalic, atraumatic, pupils equal round reactive to light, neck supple, no masses, no lymphadenopathy, thyroid nonpalpable.  Skin: Warm and dry, no rashes. Cardiac: Regular rate and rhythm, no murmurs rubs or gallops, no lower extremity edema.  Respiratory: Clear to auscultation bilaterally. Not using accessory muscles, speaking in full sentences.  Impression and Recommendations:    Bilateral hand pain Awaiting MRI and nerve conduction, he will call to get it scheduled at [facility], also awaiting nerve conduction, routing to [place].  Lumbar degenerative disc disease L5-S1, improved with rehabilitation exercises, not so much with tramadol or NSAIDs all of which caused him some  drowsiness.Marland Kitchen. He will be more diligent with his rehabilitation at home, every day, and we can return in one month to discuss this again, MRI if no better.  I spent 25 minutes with this patient, greater than 50% was face-to-face time counseling regarding the above diagnoses

## 2017-05-05 NOTE — Assessment & Plan Note (Signed)
L5-S1, improved with rehabilitation exercises, not so much with tramadol or NSAIDs all of which caused him some drowsiness.Marland Kitchen. He will be more diligent with his rehabilitation at home, every day, and we can return in one month to discuss this again, MRI if no better.

## 2017-05-05 NOTE — Assessment & Plan Note (Signed)
Awaiting MRI and nerve conduction, he will call to get it scheduled at [facility], also awaiting nerve conduction, routing to [place].

## 2017-05-19 ENCOUNTER — Other Ambulatory Visit: Payer: Self-pay | Admitting: Sports Medicine

## 2017-05-19 DIAGNOSIS — M79641 Pain in right hand: Secondary | ICD-10-CM

## 2017-05-19 DIAGNOSIS — M79642 Pain in left hand: Principal | ICD-10-CM

## 2017-05-20 ENCOUNTER — Ambulatory Visit (HOSPITAL_BASED_OUTPATIENT_CLINIC_OR_DEPARTMENT_OTHER): Payer: BLUE CROSS/BLUE SHIELD

## 2017-05-22 ENCOUNTER — Other Ambulatory Visit: Payer: BLUE CROSS/BLUE SHIELD

## 2017-05-22 ENCOUNTER — Ambulatory Visit (INDEPENDENT_AMBULATORY_CARE_PROVIDER_SITE_OTHER): Payer: BLUE CROSS/BLUE SHIELD

## 2017-05-22 ENCOUNTER — Telehealth: Payer: Self-pay | Admitting: Sports Medicine

## 2017-05-22 DIAGNOSIS — G8929 Other chronic pain: Secondary | ICD-10-CM

## 2017-05-22 DIAGNOSIS — M79641 Pain in right hand: Secondary | ICD-10-CM

## 2017-05-22 DIAGNOSIS — M79642 Pain in left hand: Secondary | ICD-10-CM | POA: Diagnosis not present

## 2017-05-22 MED ORDER — TRAMADOL HCL 50 MG PO TABS: 100.0000 mg | ORAL_TABLET | Freq: Two times a day (BID) | ORAL | 0 refills | Status: DC

## 2017-05-22 NOTE — Telephone Encounter (Signed)
Rx in box. 

## 2017-05-22 NOTE — Telephone Encounter (Signed)
I can do this but would you please let me know how many times he takes it per day so I can refill with an appropriate number?

## 2017-05-22 NOTE — Telephone Encounter (Signed)
PT advised that he wanted a refill on his traMADOL but wanted to let you know, please advise

## 2017-05-22 NOTE — Telephone Encounter (Signed)
Patient states he takes 2 pills twice a day

## 2017-05-31 ENCOUNTER — Encounter (INDEPENDENT_AMBULATORY_CARE_PROVIDER_SITE_OTHER): Payer: Self-pay

## 2017-05-31 ENCOUNTER — Encounter: Payer: Self-pay | Admitting: Osteopathic Medicine

## 2017-05-31 ENCOUNTER — Ambulatory Visit (INDEPENDENT_AMBULATORY_CARE_PROVIDER_SITE_OTHER): Payer: BLUE CROSS/BLUE SHIELD | Admitting: Osteopathic Medicine

## 2017-05-31 ENCOUNTER — Ambulatory Visit (INDEPENDENT_AMBULATORY_CARE_PROVIDER_SITE_OTHER): Payer: BLUE CROSS/BLUE SHIELD | Admitting: Neurology

## 2017-05-31 VITALS — BP 127/83 | HR 89 | Ht 71.0 in | Wt 159.0 lb

## 2017-05-31 DIAGNOSIS — Z0289 Encounter for other administrative examinations: Secondary | ICD-10-CM

## 2017-05-31 DIAGNOSIS — M79642 Pain in left hand: Secondary | ICD-10-CM

## 2017-05-31 DIAGNOSIS — R5383 Other fatigue: Secondary | ICD-10-CM

## 2017-05-31 DIAGNOSIS — M79641 Pain in right hand: Secondary | ICD-10-CM | POA: Diagnosis not present

## 2017-05-31 NOTE — Progress Notes (Signed)
HPI: Larry Mercado is a 36 y.o. male  who presents to [facility] today, 05/31/17,  for chief complaint of:  Chief Complaint  Patient presents with  . Fatigue    Fatigue  . Context: concerned about testosterone and medication side effects. Stopped stimulants - was causing palpitations.  . Quality: fatigue and no energy . Duration: few months  . Assoc signs/symptoms: increased appetite, sleeping all day, decreased libido.     Past medical history, surgical history, social history and family history reviewed.  Patient Active Problem List   Diagnosis Date Noted  . Lumbar degenerative disc disease 04/14/2017  . Bilateral hand pain 01/03/2017  . Attention deficit disorder (ADD) in adult 01/03/2017  . Tachycardia 01/03/2017  . Attention deficit hyperactivity disorder (ADHD) 12/14/2015  . Injury of left foot 10/14/2015  . Dyspnea on exertion 02/25/2015  . Anxiety and depression 02/05/2015  . Low serum HDL 02/05/2015  . Rosacea 02/05/2015    Current medication list and allergy/intolerance information reviewed.   Current Outpatient Prescriptions on File Prior to Visit  Medication Sig Dispense Refill  . amphetamine-dextroamphetamine (ADDERALL XR) 20 MG 24 hr capsule Take 1 capsule (20 mg total) by mouth every morning. 30 capsule 0  . diclofenac (VOLTAREN) 75 MG EC tablet Take 1 tablet (75 mg total) by mouth 2 (two) times daily. 60 tablet 3  . ketoconazole (NIZORAL) 2 % cream     . predniSONE (DELTASONE) 50 MG tablet One tab PO daily for 5 days. 5 tablet 0  . SOOLANTRA 1 % CREA     . traMADol (ULTRAM) 50 MG tablet Take 2 tablets (100 mg total) by mouth 2 (two) times daily. 120 tablet 0   No current facility-administered medications on file prior to visit.    Allergies  Allergen Reactions  . Abilify [Aripiprazole]     Dizziness       Review of Systems:  Constitutional: No recent illness  HEENT: No  headache, no vision change  Cardiac:  No  chest pain, No  pressure, No palpitations   Respiratory:  No  shortness of breath.  Gastrointestinal: No  abdominal pain  Hem/Onc: No  easy bruising/bleeding  Neurologic: No  weakness, No  Dizziness  Psychiatric: No  concerns with depression, No  concerns with anxiety  Exam:  BP 127/83   Pulse 89   Ht 5\' 11"  (1.803 m)   Wt 159 lb (72.1 kg)   BMI 22.18 kg/m   Constitutional: VS see above. General Appearance: alert, well-developed, well-nourished, NAD  Eyes: Normal lids and conjunctive, non-icteric sclera  Ears, Nose, Mouth, Throat: MMM, Normal external inspection ears/nares/mouth/lips/gums.  Neck: No masses, trachea midline.   Respiratory: Normal respiratory effort. no wheeze, no rhonchi, no rales  Cardiovascular: S1/S2 normal, no murmur, no rub/gallop auscultated. RRR.   Musculoskeletal: Gait normal. Symmetric and independent movement of all extremities  Neurological: Normal balance/coordination. No tremor.  Skin: warm, dry, intact.   Psychiatric: Normal judgment/insight. Normal mood and affect. Oriented x3.    Recent Results (from the past 2160 hour(s))  Cyclic citrul peptide antibody, IgG     Status: None   Collection Time: 03/24/17  4:42 PM  Result Value Ref Range   Cyclic Citrullin Peptide Ab <45<16 Units    Comment:   Reference Range Negative               < 20 Weak Positive            20 - 39  Moderate Positive        40 - 59 Strong Positive        > 59   Sedimentation rate     Status: None   Collection Time: 03/24/17  4:42 PM  Result Value Ref Range   Sed Rate 1 0 - 15 mm/hr  Rheumatoid factor     Status: None   Collection Time: 03/24/17  4:42 PM  Result Value Ref Range   Rhuematoid fact SerPl-aCnc <14 <14 IU/mL  ANA     Status: None   Collection Time: 03/24/17  4:42 PM  Result Value Ref Range   Anit Nuclear Antibody(ANA) NEG NEGATIVE  CK     Status: None   Collection Time: 03/24/17  4:42 PM  Result Value Ref Range   Total CK 87 44 - 196 U/L     Comment: ** Please note change in reference range(s). **     Uric acid     Status: None   Collection Time: 03/24/17  4:42 PM  Result Value Ref Range   Uric Acid, Serum 5.2 4.0 - 8.0 mg/dL     [facility] 05/[facility]/2017  Decreased Interest 0  Down, Depressed, Hopeless 0  PHQ - 2 Score 0      ASSESSMENT/PLAN:   Fatigue, unspecified type - Plan: CBC, COMPLETE METABOLIC PANEL WITH GFR, TSH, Testosterone, VITAMIN D 25 Hydroxy (Vit-D Deficiency, Fractures), Vitamin B12    Patient Instructions  Plan:  Labs to check testosterone and a few other causes of fatigue  Goal if labs normal: optimize nutrition & exercise, monitor how you're doing next few months and consider repeat labs if no better     Follow-up plan: Return for recheck fatigue depending on labs .  Visit summary with medication list and pertinent instructions was printed for patient to review, alert us if any changes needed. All questions at time of visit were answered - patient instructed to contact office with any additional concerns. ER/RTC precautions were reviewed with the patient and understanding verbalized.

## 2017-05-31 NOTE — Patient Instructions (Signed)
Plan:  Labs to check testosterone and a few other causes of fatigue  Goal if labs normal: optimize nutrition & exercise, monitor how you're doing next few months and consider repeat labs if no better

## 2017-05-31 NOTE — Procedures (Signed)
     HISTORY:  Larry FinlayJeremy Mercado is a 36 year old gentleman with a one-year history of wrist and PIP joint dysfunction both hands that occurs with handwriting or with typing. He denies any numbness, he denies any pain in the neck or down the arms. He is being evaluated for possible neuropathy or a cervical radiculopathy.  NERVE CONDUCTION STUDIES:  Nerve conduction studies were performed on both upper extremities. The distal motor latencies and motor amplitudes for the median and ulnar nerves were within normal limits. The F wave latencies and nerve conduction velocities for these nerves were also normal. The sensory latencies for the median and ulnar nerves were normal.   EMG STUDIES:  EMG study was performed on the left upper extremity:  The first dorsal interosseous muscle reveals 2 to 4 K units with full recruitment. No fibrillations or positive waves were noted. The abductor pollicis brevis muscle reveals 2 to 4 K units with full recruitment. No fibrillations or positive waves were noted. The extensor indicis proprius muscle reveals 1 to 3 K units with full recruitment. No fibrillations or positive waves were noted. The pronator teres muscle reveals 2 to 3 K units with full recruitment. No fibrillations or positive waves were noted. The biceps muscle reveals 1 to 2 K units with full recruitment. No fibrillations or positive waves were noted. The triceps muscle reveals 2 to 4 K units with full recruitment. No fibrillations or positive waves were noted. The anterior deltoid muscle reveals 2 to 3 K units with full recruitment. No fibrillations or positive waves were noted. The cervical paraspinal muscles were tested at 2 levels. No abnormalities of insertional activity were seen at either level tested. There was good relaxation.  A limited EMG study was performed on the right upper extremity:  The first dorsal interosseous muscle reveals 2 to 4 K units with full recruitment. No fibrillations  or positive waves were noted. The abductor pollicis brevis muscle reveals 2 to 4 K units with full recruitment. No fibrillations or positive waves were noted. The extensor indicis proprius muscle reveals 1 to 3 K units with full recruitment. No fibrillations or positive waves were noted.  IMPRESSION:  Nerve conduction studies done on both upper extremities were within normal limits. No evidence of a neuropathy is seen. EMG evaluation of the left upper extremity was unremarkable, no evidence of an overlying cervical radiculopathy is seen. A limited EMG of the right upper extremity was unremarkable.  Marlan Palau. Keith Josy Peaden MD 05/31/2017 4:34 PM  [facility] [address] [place], [place] [id number]  Phone [phone] Fax [phone]

## 2017-06-03 LAB — CBC
HCT: 46.3 % (ref 38.5–50.0)
HEMOGLOBIN: 15.9 g/dL (ref 13.2–17.1)
MCH: 31.2 pg (ref 27.0–33.0)
MCHC: 34.3 g/dL (ref 32.0–36.0)
MCV: 90.8 fL (ref 80.0–100.0)
MPV: 10.2 fL (ref 7.5–12.5)
Platelets: 199 10*3/uL (ref 140–400)
RBC: 5.1 MIL/uL (ref 4.20–5.80)
RDW: 13 % (ref 11.0–15.0)
WBC: 7 10*3/uL (ref 3.8–10.8)

## 2017-06-04 LAB — TSH: TSH: 0.74 mIU/L (ref 0.40–4.50)

## 2017-06-04 LAB — COMPLETE METABOLIC PANEL WITH GFR
ALT: 26 U/L (ref 9–46)
AST: 21 U/L (ref 10–40)
Albumin: 4.2 g/dL (ref 3.6–5.1)
Alkaline Phosphatase: 76 U/L (ref 40–115)
BUN: 19 mg/dL (ref 7–25)
CO2: 24 mmol/L (ref 20–31)
Calcium: 9.2 mg/dL (ref 8.6–10.3)
Chloride: 105 mmol/L (ref 98–110)
Creat: 0.91 mg/dL (ref 0.60–1.35)
Glucose, Bld: 84 mg/dL (ref 65–99)
Potassium: 4.4 mmol/L (ref 3.5–5.3)
Sodium: 139 mmol/L (ref 135–146)
TOTAL PROTEIN: 6.9 g/dL (ref 6.1–8.1)
Total Bilirubin: 0.4 mg/dL (ref 0.2–1.2)

## 2017-06-04 LAB — VITAMIN B12: Vitamin B-12: 413 pg/mL (ref 200–1100)

## 2017-06-04 LAB — TESTOSTERONE: TESTOSTERONE: 811 ng/dL (ref 250–827)

## 2017-06-05 ENCOUNTER — Ambulatory Visit: Payer: BLUE CROSS/BLUE SHIELD | Admitting: Osteopathic Medicine

## 2017-06-05 LAB — VITAMIN D 25 HYDROXY (VIT D DEFICIENCY, FRACTURES): VIT D 25 HYDROXY: 35 ng/mL (ref 30–100)

## 2017-12-15 ENCOUNTER — Ambulatory Visit: Payer: BLUE CROSS/BLUE SHIELD | Admitting: Family Medicine

## 2017-12-15 ENCOUNTER — Encounter: Payer: Self-pay | Admitting: Family Medicine

## 2017-12-15 VITALS — BP 117/69 | HR 69 | Ht 71.0 in | Wt 163.0 lb

## 2017-12-15 DIAGNOSIS — Z23 Encounter for immunization: Secondary | ICD-10-CM | POA: Diagnosis not present

## 2017-12-15 DIAGNOSIS — B353 Tinea pedis: Secondary | ICD-10-CM

## 2017-12-15 MED ORDER — TERBINAFINE HCL 250 MG PO TABS: 250.0000 mg | ORAL_TABLET | Freq: Every day | ORAL | 1 refills | Status: AC

## 2017-12-15 NOTE — Progress Notes (Signed)
Larry Mercado is a 37 y.o. male who presents [facility] today for athlete's foot.  Here he notes a several week history of bothersome rash at the plantar left foot.  He notes a whitish mildly itchy rash at the plantar foot especially underneath the tarsal heads.  He is tried treating with Tinactin and Lotrimin over-the-counter athlete's foot spray and lotion which helps but does not provide good lasting symptom control.  He typically uses these for a week or 2 at a time.  He denies any change in behaviors medications fevers chills nausea vomiting or diarrhea.  No new soaps detergents or shampoos.  No other rash.  He feels well otherwise.   Past Medical History:  Diagnosis Date  . Allergic rhinitis   . Dyslipidemia   . Dyspepsia   . GERD (gastroesophageal reflux disease)   . Groin pain, right lower quadrant   . IBS (irritable bowel syndrome)   . Sebaceous cyst    Past Surgical History:  Procedure Laterality Date  . HYDROCELE EXCISION / REPAIR    . MOUTH SURGERY    . SPERMATIC VEIN LIGATION     Social History   Tobacco Use  . Smoking status: Never Smoker  . Smokeless tobacco: Never Used  Substance Use Topics  . Alcohol use: Yes   family history includes Cancer in his father and maternal grandmother; Heart disease in his paternal grandfather.  ROS as above:  Medications: Current Outpatient Medications  Medication Sig Dispense Refill  . diclofenac (VOLTAREN) 75 MG EC tablet Take 1 tablet (75 mg total) by mouth 2 (two) times daily. 60 tablet 3  . ketoconazole (NIZORAL) 2 % cream     . SOOLANTRA 1 % CREA     . traMADol (ULTRAM) 50 MG tablet Take 2 tablets (100 mg total) by mouth 2 (two) times daily. 120 tablet 0  . terbinafine (LAMISIL) 250 MG tablet Take 1 tablet (250 mg total) by mouth daily. 14 tablet 1   No current facility-administered medications for  this visit.    Allergies  Allergen Reactions  . Abilify [Aripiprazole]     Dizziness     Health Maintenance Health Maintenance  Topic Date Due  . HIV Screening  07/29/1998  . TETANUS/TDAP  07/29/2002  . INFLUENZA VACCINE  01/02/2018 (Originally 05/31/2017)     Exam:  BP 117/69   Pulse 69   Ht 5\' 11"  (1.803 m)   Wt 163 lb (73.9 kg)   BMI 22.73 kg/m  Gen: Well NAD HEENT: EOMI,  MMM Lungs: Normal work of breathing. CTABL Heart: RRR no MRG Abd: NABS, Soft. Nondistended, Nontender Exts: Brisk capillary refill, warm and well perfused.  Pulses in the left foot are intact. Skin: Left foot pretty normal-appearing with no obvious rash.    No results found for this or any previous visit (from the past 72 hour(s)). No results found.    Assessment and Plan: 37 y.o. male with likely tinea pedis of the left foot.  This is partially treated and technically tinea incognito.  I think it is reasonable to presume that his diagnosis is correct as he had good but incomplete response with topical antifungals.  At this point I think is reasonable to use a course of oral terbinafine that should provide better control.  We will use 2 weeks of daily terbinafine and recheck as needed.  Tdap given today prior to discharge.  Orders Placed This Encounter  Procedures  . Tdap vaccine greater than or equal to 7yo IM   Meds ordered this encounter  Medications  . terbinafine (LAMISIL) 250 MG tablet    Sig: Take 1 tablet (250 mg total) by mouth daily.    Dispense:  14 tablet    Refill:  1     Discussed warning signs or symptoms. Please see discharge instructions. Patient expresses understanding.

## 2017-12-15 NOTE — Patient Instructions (Signed)
Thank you for coming in today. Take the Terbinafine daily for 2 weeks.  Recheck as needed.  Change socks after workout.    Athlete's Foot Athlete's foot (tinea pedis) is a fungal infection of the skin on the feet. It often occurs on the skin that is between or underneath the toes. It can also occur on the soles of the feet. The infection can spread from person to person (is contagious). What are the causes? Athlete's foot is caused by a fungus. This fungus grows in warm, moist places. Most people get athlete's foot by sharing shower stalls, towels, and wet floors with someone who is infected. Not washing your feet or changing your socks often enough can contribute to athlete's foot. What increases the risk? This condition is more likely to develop in:  Men.  People who have a weak body defense system (immune system).  People who have diabetes.  People who use public showers, such as at a gym.  People who wear heavy-duty shoes, such as [employer] workerindustrial or military shoes.  Seasons with warm, humid weather.  What are the signs or symptoms? Symptoms of this condition include:  Itchy areas between the toes or on the soles of the feet.  White, flaky, or scaly areas between the toes or on the soles of the feet.  Very itchy small blisters between the toes or on the soles of the feet.  Small cuts on the skin. These cuts can become infected.  Thick or discolored toenails.  How is this diagnosed? This condition is diagnosed with a medical history and physical exam. Your health care provider may also take a skin or toenail sample to be examined. How is this treated? Treatment for this condition includes antifungal medicines. These may be applied as powders, ointments, or creams. In severe cases, an oral antifungal medicine may be given. Follow these instructions at home:  Apply or take over-the-counter and prescription medicines only as told by your health care provider.  Keep all  follow-up visits as told by your health care provider. This is important.  Do not scratch your feet.  Keep your feet dry: ? Wear cotton or wool socks. Change your socks every day or if they become wet. ? Wear shoes that allow air to circulate, such as sandals or canvas tennis shoes.  Wash and dry your feet: ? Every day or as told by your health care provider. ? After exercising. ? Including the area between your toes.  Do not share towels, nail clippers, or other personal items that touch your feet with others.  If you have diabetes, keep your blood sugar under control. How is this prevented?  Do not share towels.  Wear sandals in wet areas, such as locker rooms and shared showers.  Keep your feet dry: ? Wear cotton or wool socks. Change your socks every day or if they become wet. ? Wear shoes that allow air to circulate, such as sandals or canvas tennis shoes.  Wash and dry your feet after exercising. Pay attention to the area between your toes. Contact a health care provider if:  You have a fever.  You have swelling, soreness, warmth, or redness in your foot.  You are not getting better with treatment.  Your symptoms get worse.  You have new symptoms. This information is not intended to replace advice given to you by your health care provider. Make sure you discuss any questions you have with your health care provider. Document Released: 10/14/2000 Document Revised: 03/24/2016  Document Reviewed: 04/20/2015 Elsevier Interactive Patient Education  2018 Elsevier Inc.  

## 2017-12-15 NOTE — Progress Notes (Signed)
DAP  

## 2018-01-07 DIAGNOSIS — J069 Acute upper respiratory infection, unspecified: Secondary | ICD-10-CM | POA: Diagnosis not present

## 2018-01-07 DIAGNOSIS — S93402A Sprain of unspecified ligament of left ankle, initial encounter: Secondary | ICD-10-CM | POA: Diagnosis not present

## 2018-01-07 DIAGNOSIS — B9789 Other viral agents as the cause of diseases classified elsewhere: Secondary | ICD-10-CM | POA: Diagnosis not present

## 2018-02-02 DIAGNOSIS — J020 Streptococcal pharyngitis: Secondary | ICD-10-CM | POA: Diagnosis not present

## 2018-02-02 DIAGNOSIS — R109 Unspecified abdominal pain: Secondary | ICD-10-CM | POA: Diagnosis not present

## 2018-02-02 DIAGNOSIS — J1189 Influenza due to unidentified influenza virus with other manifestations: Secondary | ICD-10-CM | POA: Diagnosis not present

## 2018-02-02 DIAGNOSIS — K219 Gastro-esophageal reflux disease without esophagitis: Secondary | ICD-10-CM | POA: Diagnosis not present

## 2018-06-24 ENCOUNTER — Other Ambulatory Visit: Payer: Self-pay

## 2018-06-24 ENCOUNTER — Emergency Department (INDEPENDENT_AMBULATORY_CARE_PROVIDER_SITE_OTHER)
Admission: EM | Admit: 2018-06-24 | Discharge: 2018-06-24 | Disposition: A | Discharge status: Home / Self Care | Payer: BLUE CROSS/BLUE SHIELD | Source: Home / Self Care | Attending: Family Medicine | Admitting: Family Medicine

## 2018-06-24 ENCOUNTER — Encounter: Payer: Self-pay | Admitting: Emergency Medicine

## 2018-06-24 ENCOUNTER — Emergency Department: Payer: BLUE CROSS/BLUE SHIELD

## 2018-06-24 DIAGNOSIS — K219 Gastro-esophageal reflux disease without esophagitis: Secondary | ICD-10-CM

## 2018-06-24 DIAGNOSIS — R0602 Shortness of breath: Secondary | ICD-10-CM

## 2018-06-24 DIAGNOSIS — J069 Acute upper respiratory infection, unspecified: Secondary | ICD-10-CM | POA: Diagnosis not present

## 2018-06-24 HISTORY — DX: Dorsalgia, unspecified: M54.9

## 2018-06-24 HISTORY — DX: Other chronic pain: G89.29

## 2018-06-24 MED ORDER — OMEPRAZOLE 20 MG PO CPDR: 20.0000 mg | DELAYED_RELEASE_CAPSULE | Freq: Two times a day (BID) | ORAL | 0 refills | Status: DC

## 2018-06-24 MED ORDER — AZITHROMYCIN 250 MG PO TABS: 250.0000 mg | ORAL_TABLET | Freq: Every day | ORAL | 0 refills | Status: DC

## 2018-06-24 NOTE — Discharge Instructions (Signed)
°  Please follow up with family medicine in 1 week if not improving.  To help get additional information about your episodes of shortness of breath at home, it may be beneficial to purchase an over the counter pulse-ox machine that attaches to your finger.  It would be beneficial if you can record your symptoms, time of day, events leading up to episode, how long it lasts, and your heart rate and oxygen.  Bring this to your next visit with family medicine to help her in diagnosing and treating your recurrent symptoms.   Please call 911 or go to the hospital if symptoms worsening.

## 2018-06-24 NOTE — ED Triage Notes (Signed)
37 y.o male presents c/o cough, congestion and burning to chest.

## 2018-06-24 NOTE — ED Provider Notes (Signed)
[facility]    CSN: [id number] Arrival date & time: 06/24/18  1403     History   Chief Complaint Chief Complaint  Patient presents with  . Cough  . Nasal Congestion    HPI Christerpher Clos is a 37 y.o. male.   HPI  Aidian Salomon is a 37 y.o. male presenting to UC with c/o 4 days of cough, congestion, burning in his chest and SOB.  He has an albuterol inhaler but denies relief when using it. He has had SOB intermittently for several years. He reports having a full workup with an CXR and EKG in the past for same symptoms, everything was normal. He was advised it was likely due to stress but states he gets these episodes even when he does not feel stressed. Hx of acid reflux in the past. He has taken OTC medications w/o relief. Denies chest pain but has some tightness and SOB at this time.  Denies fever, chills, n/v/d.    Past Medical History:  Diagnosis Date  . Allergic rhinitis   . Chronic back pain   . Dyslipidemia   . Dyspepsia   . GERD (gastroesophageal reflux disease)   . Groin pain, right lower quadrant   . IBS (irritable bowel syndrome)   . Sebaceous cyst     Patient Active Problem List   Diagnosis Date Noted  . Lumbar degenerative disc disease 04/14/2017  . Bilateral hand pain 01/03/2017  . Attention deficit disorder (ADD) in adult 01/03/2017  . Tachycardia 01/03/2017  . Attention deficit hyperactivity disorder (ADHD) 12/14/2015  . Injury of left foot 10/14/2015  . Dyspnea on exertion 02/25/2015  . Anxiety and depression 02/05/2015  . Low serum HDL 02/05/2015  . Rosacea 02/05/2015    Past Surgical History:  Procedure Laterality Date  . HYDROCELE EXCISION / REPAIR    . MOUTH SURGERY    . SPERMATIC VEIN LIGATION         Home Medications    Prior to Admission medications   Medication Sig Start Date End Date Taking? Authorizing Provider  azithromycin (ZITHROMAX) 250 MG tablet Take 1 tablet (250 mg total) by mouth daily. Take first 2 tablets  together, then 1 every day until finished. 06/24/18   Lurene Shadow, PA-C  ketoconazole (NIZORAL) 2 % cream  01/20/17   [provider]  omeprazole (PRILOSEC) 20 MG capsule Take 1 capsule (20 mg total) by mouth 2 (two) times daily before a meal. 06/24/18   Lurene Shadow, PA-C  SOOLANTRA 1 % CREA  01/20/17   [provider]  traMADol (ULTRAM) 50 MG tablet Take 2 tablets (100 mg total) by mouth 2 (two) times daily. 05/22/17   Monica Becton, MD    Family History Family History  Problem Relation Age of Onset  . Cancer Father        brain  . Heart disease Paternal Grandfather   . Cancer Maternal Grandmother        mesothelioma    Social History Social History   Tobacco Use  . Smoking status: Never Smoker  . Smokeless tobacco: Never Used  Substance Use Topics  . Alcohol use: Yes  . Drug use: No     Allergies   Abilify [aripiprazole]   Review of Systems Review of Systems  Constitutional: Negative for chills and fever.  HENT: Positive for congestion, sinus pressure and sinus pain. Negative for ear pain, sore throat, trouble swallowing and voice change.   Respiratory: Positive for cough,  chest tightness and shortness of breath. Negative for wheezing.   Cardiovascular: Negative for chest pain and palpitations.  Gastrointestinal: Negative for abdominal pain, diarrhea, nausea and vomiting.  Musculoskeletal: Negative for arthralgias, back pain and myalgias.  Skin: Negative for rash.  Psychiatric/Behavioral: The patient is not nervous/anxious.      Physical Exam Triage Vital Signs ED Triage Vitals  Enc Vitals Group     BP 06/24/18 1439 124/78     Pulse Rate 06/24/18 1439 87     Resp --      Temp 06/24/18 1439 98.2 F (36.8 C)     Temp Source 06/24/18 1439 Oral     SpO2 06/24/18 1439 98 %     Weight 06/24/18 1440 167 lb 12.8 oz (76.1 kg)     Height 06/24/18 1440 5\' 11"  (1.803 m)     Head Circumference --      Peak Flow --      Pain Score 06/24/18  1440 6     Pain Loc --      Pain Edu? --      Excl. in GC? --    No data found.  Updated Vital Signs BP 124/78 (BP Location: Right Arm)   Pulse 87   Temp 98.2 F (36.8 C) (Oral)   Ht 5\' 11"  (1.803 m)   Wt 167 lb 12.8 oz (76.1 kg)   SpO2 98%   BMI 23.40 kg/m   Visual Acuity Right Eye Distance:   Left Eye Distance:   Bilateral Distance:    Right Eye Near:   Left Eye Near:    Bilateral Near:     Physical Exam  Constitutional: He is oriented to person, place, and time. He appears well-developed and well-nourished. No distress.  HENT:  Head: Normocephalic and atraumatic.  Right Ear: Tympanic membrane normal.  Left Ear: Tympanic membrane normal.  Nose: Mucosal edema present. Right sinus exhibits maxillary sinus tenderness and frontal sinus tenderness. Left sinus exhibits maxillary sinus tenderness and frontal sinus tenderness.  Mouth/Throat: Uvula is midline, oropharynx is clear and moist and mucous membranes are normal.  Eyes: EOM are normal.  Neck: Normal range of motion. Neck supple.  Cardiovascular: Normal rate and regular rhythm.  Pulmonary/Chest: Effort normal and breath sounds normal. No stridor. No respiratory distress. He has no wheezes. He has no rales.  Musculoskeletal: Normal range of motion.  Neurological: He is alert and oriented to person, place, and time.  Skin: Skin is warm and dry. He is not diaphoretic.  Psychiatric: He has a normal mood and affect. His behavior is normal.  Nursing note and vitals reviewed.    UC Treatments / Results  Labs (all labs ordered are listed, but only abnormal results are displayed) Labs Reviewed - No data to display  EKG None  Radiology No results found.  Procedures Procedures (including critical care time)  Medications Ordered in UC Medications - No data to display  Initial Impression / Assessment and Plan / UC Course  I have reviewed the triage vital signs and the nursing notes.  Pertinent labs & imaging  results that were available during my care of the patient were reviewed by me and considered in my medical decision making (see chart for details).     Vitals: WNL Lungs: CTA Pt reports having a full workup with CXR and EKG in the past for same, which were all normal Will tx for URI and GERD Encouraged to monitor symptoms at home and f/u with PCP  Final Clinical Impressions(s) / UC Diagnoses   Final diagnoses:  Upper respiratory tract infection, unspecified type  Gastroesophageal reflux disease, esophagitis presence not specified  Shortness of breath     Discharge Instructions      Please follow up with family medicine in 1 week if not improving.  To help get additional information about your episodes of shortness of breath at home, it may be beneficial to purchase an over the counter pulse-ox machine that attaches to your finger.  It would be beneficial if you can record your symptoms, time of day, events leading up to episode, how long it lasts, and your heart rate and oxygen.  Bring this to your next visit with family medicine to help her in diagnosing and treating your recurrent symptoms.   Please call 911 or go to the hospital if symptoms worsening.    ED Prescriptions    Medication Sig Dispense Auth. Provider   azithromycin (ZITHROMAX) 250 MG tablet Take 1 tablet (250 mg total) by mouth daily. Take first 2 tablets together, then 1 every day until finished. 6 tablet Doroteo GlassmanPhelps, Lalena Salas O, PA-C   omeprazole (PRILOSEC) 20 MG capsule Take 1 capsule (20 mg total) by mouth 2 (two) times daily before a meal. 60 capsule Lurene ShadowPhelps, Serenidy Waltz O, PA-C     Controlled Substance Prescriptions [place] Controlled Substance Registry consulted? Not Applicable   Rolla Platehelps, Cono Gebhard O, PA-C 06/27/18 [id number]

## 2018-11-07 ENCOUNTER — Ambulatory Visit (INDEPENDENT_AMBULATORY_CARE_PROVIDER_SITE_OTHER): Payer: BLUE CROSS/BLUE SHIELD | Admitting: Osteopathic Medicine

## 2018-11-07 ENCOUNTER — Encounter: Payer: Self-pay | Admitting: Osteopathic Medicine

## 2018-11-07 VITALS — BP 115/70 | HR 74 | Temp 97.5°F | Wt 165.2 lb

## 2018-11-07 DIAGNOSIS — Z0000 Encounter for general adult medical examination without abnormal findings: Secondary | ICD-10-CM | POA: Diagnosis not present

## 2018-11-07 DIAGNOSIS — R002 Palpitations: Secondary | ICD-10-CM | POA: Diagnosis not present

## 2018-11-07 DIAGNOSIS — Z23 Encounter for immunization: Secondary | ICD-10-CM

## 2018-11-07 NOTE — Patient Instructions (Addendum)
General Preventive Care  Most recent routine screening lipids/other labs: ordered today.   Everyone should have blood pressure checked once per year. Yours looks fine!  Tobacco: don't!  Alcohol: responsible moderation is ok for most adults - if you have concerns about your alcohol intake, please talk to me!   Exercise: as tolerated to reduce risk of cardiovascular disease and diabetes. Strength training will also prevent osteoporosis.   Mental health: if need for mental health care (medicines, counseling, other), or concerns about moods, please let me know!   Sexual health: if need for STD testing, or if concerns with libido/pain problems, please let me know!  Advanced Directive: Living Will and/or Healthcare Power of Attorney recommended for all adults, regardless of age or health.  Vaccines  Flu vaccine: recommended for almost everyone, every fall.   Shingles vaccine: Shingrix recommended after age 8.   Pneumonia vaccines: Prevnar and Pneumovax recommended after age 23.  Tetanus booster: done 12/2017. Tdap recommended every 10 years.   HPV vaccine: Gardasil up to age 27 to prevent HPV-associated diseases, including certain cancers.  Cancer screenings   Colon cancer screening: recommended for everyone at age 63, but some folks need a colonoscopy sooner if risk factors   Prostate cancer screening: PSA blood test around age 95-71  Lung cancer screening: not needed if never smoker Infection screenings . HIV, Gonorrhea/Chlamydia: screening as needed.  . Hepatitis C: recommended for anyone born 55-1963 . TB: certain at-risk populations, or depending on work requirements and/or travel history   Please note: Preventive care issues were addressed today as part of your annual wellness physical, and this care should be covered under your insurance. However there were other medical issues which were also addressed today, and insurance may bill you separately for a "problem-based  visit" for this care for palpitations. Any questions or concerns about charges which may appear on your billing statement should be directed to your insurance company or to [facility] billing department, and they will contact our office if there are further concerns.

## 2018-11-07 NOTE — Progress Notes (Signed)
HPI: Larry Mercado is a 38 y.o. male who  has a past medical history of Allergic rhinitis, Chronic back pain, Dyslipidemia, Dyspepsia, GERD (gastroesophageal reflux disease), Groin pain, right lower quadrant, IBS (irritable bowel syndrome), and Sebaceous cyst.  he presents to [facility] today, 11/07/18,  for chief complaint of: Annual Physical     Patient here for annual physical / wellness exam.  See preventive care reviewed as below.    Additional concerns today include:  Occasional SOB, chest palpitations. Happens at random, few times a week, usually at rest, no problems with exertion, feels a little SOB when it occurs. Otherwise neg ROS. Has been ongoing for a few years, was previously told probably anxiety, but he doesn't really feel anxious with this.       Past medical, surgical, social and family history reviewed:  Patient Active Problem List   Diagnosis Date Noted  . Lumbar degenerative disc disease 04/14/2017  . Bilateral hand pain 01/03/2017  . Attention deficit disorder (ADD) in adult 01/03/2017  . Tachycardia 01/03/2017  . Attention deficit hyperactivity disorder (ADHD) 12/14/2015  . Injury of left foot 10/14/2015  . Dyspnea on exertion 02/25/2015  . Anxiety and depression 02/05/2015  . Low serum HDL 02/05/2015  . Rosacea 02/05/2015    Past Surgical History:  Procedure Laterality Date  . HYDROCELE EXCISION / REPAIR    . MOUTH SURGERY    . SPERMATIC VEIN LIGATION      Social History   Tobacco Use  . Smoking status: Never Smoker  . Smokeless tobacco: Never Used  Substance Use Topics  . Alcohol use: Yes    Family History  Problem Relation Age of Onset  . Cancer Father        brain  . Heart disease Paternal Grandfather   . Cancer Maternal Grandmother        mesothelioma     Current medication list and allergy/intolerance information reviewed:    Current Outpatient Medications  Medication Sig Dispense Refill   . ketoconazole (NIZORAL) 2 % cream     . SOOLANTRA 1 % CREA     . azithromycin (ZITHROMAX) 250 MG tablet Take 1 tablet (250 mg total) by mouth daily. Take first 2 tablets together, then 1 every day until finished. (Patient not taking: Reported on 11/07/2018) 6 tablet 0  . omeprazole (PRILOSEC) 20 MG capsule Take 1 capsule (20 mg total) by mouth 2 (two) times daily before a meal. (Patient not taking: Reported on 11/07/2018) 60 capsule 0  . traMADol (ULTRAM) 50 MG tablet Take 2 tablets (100 mg total) by mouth 2 (two) times daily. (Patient not taking: Reported on 11/07/2018) 120 tablet 0   No current facility-administered medications for this visit.     Allergies  Allergen Reactions  . Abilify [Aripiprazole]     Dizziness       Review of Systems:  Constitutional:  No  fever, no chills, No recent illness, No unintentional weight changes. +fatigue.   HEENT: No  headache, no vision change, no hearing change, No sore throat, No  sinus pressure  Cardiac: No  chest pain, +pressure, +palpitations, No  Orthopnea  Respiratory:  +shortness of breath. No  Cough  Gastrointestinal: No  abdominal pain, No  nausea, No  vomiting,  No  blood in stool, No  diarrhea, No  constipation   Musculoskeletal: No new myalgia/arthralgia  Skin: No  Rash, No other wounds/concerning lesions  Genitourinary: No  incontinence, No  abnormal genital bleeding,  No abnormal genital discharge  Hem/Onc: No  easy bruising/bleeding, No  abnormal lymph node  Endocrine: No cold intolerance,  No heat intolerance. No polyuria/polydipsia/polyphagia   Neurologic: No  weakness, No  dizziness,  Psychiatric: No  concerns with depression, No  concerns with anxiety, No sleep problems, No mood problems  Exam:  BP 115/70 (BP Location: Left Arm, Patient Position: Sitting, Cuff Size: Normal)   Pulse 74   Temp (!) 97.5 F (36.4 C) (Oral)   Wt 165 lb 3.2 oz (74.9 kg)   BMI 23.04 kg/m   Constitutional: VS see above. General  Appearance: alert, well-developed, well-nourished, NAD  Eyes: Normal lids and conjunctive, non-icteric sclera  Ears, Nose, Mouth, Throat: MMM, Normal external inspection ears/nares/mouth/lips/gums. TM normal bilaterally. Pharynx/tonsils no erythema, no exudate. Nasal mucosa normal.   Neck: No masses, trachea midline. No thyroid enlargement. No tenderness/mass appreciated. No lymphadenopathy  Respiratory: Normal respiratory effort. no wheeze, no rhonchi, no rales  Cardiovascular: S1/S2 normal, no murmur, no rub/gallop auscultated. RRR. No lower extremity edema.  Gastrointestinal: Nontender, no masses. No hepatomegaly, no splenomegaly. No hernia appreciated. Bowel sounds normal. Rectal exam deferred.   Musculoskeletal: Gait normal. No clubbing/cyanosis of digits.   Neurological: Normal balance/coordination. No tremor. No cranial nerve deficit on limited exam. Motor and sensation intact and symmetric. Cerebellar reflexes intact.   Skin: warm, dry, intact. No rash/ulcer. No concerning nevi or subq nodules on limited exam.    Psychiatric: Normal judgment/insight. Normal mood and affect. Oriented x3.      EKG interpretation: Rate: 64 Rhythm: sinus No ST/T changes concerning for acute ischemia/infarct  Previous EKG reviewed, he is stable from 12/2016      ASSESSMENT/PLAN: The primary encounter diagnosis was Annual physical exam. Diagnoses of Palpitations and Need for influenza vaccination were also pertinent to this visit.   Orders Placed This Encounter  Procedures  . Flu Vaccine QUAD 6+ mos PF IM (Fluarix Quad PF)  . CBC  . COMPLETE METABOLIC PANEL WITH GFR  . Lipid panel  . TSH  . Magnesium  . HOLTER MONITOR - 72 HOUR  . EKG 12-Lead      Patient Instructions  General Preventive Care  Most recent routine screening lipids/other labs: ordered today.   Everyone should have blood pressure checked once per year. Yours looks fine!  Tobacco: don't!  Alcohol:  responsible moderation is ok for most adults - if you have concerns about your alcohol intake, please talk to me!   Exercise: as tolerated to reduce risk of cardiovascular disease and diabetes. Strength training will also prevent osteoporosis.   Mental health: if need for mental health care (medicines, counseling, other), or concerns about moods, please let me know!   Sexual health: if need for STD testing, or if concerns with libido/pain problems, please let me know!  Advanced Directive: Living Will and/or Healthcare Power of Attorney recommended for all adults, regardless of age or health.  Vaccines  Flu vaccine: recommended for almost everyone, every fall.   Shingles vaccine: Shingrix recommended after age 36.   Pneumonia vaccines: Prevnar and Pneumovax recommended after age 36.  Tetanus booster: done 12/2017. Tdap recommended every 10 years.   HPV vaccine: Gardasil up to age 36 to prevent HPV-associated diseases, including certain cancers.  Cancer screenings   Colon cancer screening: recommended for everyone at age 850, but some folks need a colonoscopy sooner if risk factors   Prostate cancer screening: PSA blood test around age 36-71  Lung cancer screening: not needed if never  smoker Infection screenings . HIV, Gonorrhea/Chlamydia: screening as needed.  . Hepatitis C: recommended for anyone born [birth year][address]1963 . TB: certain at-risk populations, or depending on work requirements and/or travel history   Please note: Preventive care issues were addressed today as part of your annual wellness physical, and this care should be covered under your insurance. However there were other medical issues which were also addressed today, and insurance may bill you separately for a "problem-based visit" for this care for palpitations. Any questions or concerns about charges which may appear on your billing statement should be directed to your insurance company or to [facility] billing department,  and they will contact our office if there are further concerns.         Visit summary with medication list and pertinent instructions was printed for patient to review. All questions at time of visit were answered - patient instructed to contact office with any additional concerns or updates. ER/RTC precautions were reviewed with the patient.    Please note: voice recognition software was used to produce this document, and typos may escape review. Please contact Dr. Lyn HollingsheadAlexander for any needed clarifications.     Follow-up plan: Return in about 1 year (around 11/08/2019) for ANNUAL PHYSICAL - SOONER IF NEEDED .

## 2018-11-08 LAB — CBC
HCT: 48.4 % (ref 38.5–50.0)
Hemoglobin: 17.1 g/dL (ref 13.2–17.1)
MCH: 31.4 pg (ref 27.0–33.0)
MCHC: 35.3 g/dL (ref 32.0–36.0)
MCV: 88.8 fL (ref 80.0–100.0)
MPV: 10.7 fL (ref 7.5–12.5)
Platelets: 239 10*3/uL (ref 140–400)
RBC: 5.45 10*6/uL (ref 4.20–5.80)
RDW: 12.3 % (ref 11.0–15.0)
WBC: 7.2 10*3/uL (ref 3.8–10.8)

## 2018-11-08 LAB — COMPLETE METABOLIC PANEL WITHOUT GFR
AG Ratio: 1.7 (calc) (ref 1.0–2.5)
ALT: 19 U/L (ref 9–46)
AST: 23 U/L (ref 10–40)
Albumin: 4.7 g/dL (ref 3.6–5.1)
Alkaline phosphatase (APISO): 84 U/L (ref 40–115)
BUN: 14 mg/dL (ref 7–25)
CO2: 29 mmol/L (ref 20–32)
Calcium: 9.7 mg/dL (ref 8.6–10.3)
Chloride: 101 mmol/L (ref 98–110)
Creat: 0.9 mg/dL (ref 0.60–1.35)
GFR, Est African American: 128 mL/min/{1.73_m2}
GFR, Est Non African American: 110 mL/min/{1.73_m2}
Globulin: 2.7 g/dL (ref 1.9–3.7)
Glucose, Bld: 81 mg/dL (ref 65–99)
Potassium: 4 mmol/L (ref 3.5–5.3)
Sodium: 140 mmol/L (ref 135–146)
Total Bilirubin: 0.9 mg/dL (ref 0.2–1.2)
Total Protein: 7.4 g/dL (ref 6.1–8.1)

## 2018-11-08 LAB — TSH: TSH: 0.73 mIU/L (ref 0.40–4.50)

## 2018-11-08 LAB — LIPID PANEL
Cholesterol: 154 mg/dL (ref <200)
HDL: 40 mg/dL — ABNORMAL LOW (ref >40)
LDL Cholesterol (Calc): 95 mg/dL (calc)
Non-HDL Cholesterol (Calc): 114 mg/dL (calc) (ref <130)
Total CHOL/HDL Ratio: 3.9 (calc) (ref <5.0)
Triglycerides: 101 mg/dL (ref <150)

## 2018-11-08 LAB — MAGNESIUM: Magnesium: 1.7 mg/dL (ref 1.5–2.5)

## 2018-11-19 ENCOUNTER — Ambulatory Visit (INDEPENDENT_AMBULATORY_CARE_PROVIDER_SITE_OTHER): Payer: BLUE CROSS/BLUE SHIELD

## 2018-11-19 DIAGNOSIS — R002 Palpitations: Secondary | ICD-10-CM | POA: Diagnosis not present

## 2018-12-05 ENCOUNTER — Telehealth: Payer: Self-pay

## 2018-12-05 DIAGNOSIS — R0600 Dyspnea, unspecified: Secondary | ICD-10-CM

## 2018-12-05 DIAGNOSIS — R0609 Other forms of dyspnea: Secondary | ICD-10-CM

## 2018-12-05 DIAGNOSIS — R002 Palpitations: Secondary | ICD-10-CM

## 2018-12-05 NOTE — Telephone Encounter (Signed)
Larry Mercado states he has palpitations with chest tightness and shortness of breath for a few seconds at least once daily. However, the pain does last a few minutes. I did give him the results of the Holter Monitor. He is wanting to know if there will be additional testing. He is concerned. Please advise.

## 2018-12-06 NOTE — Telephone Encounter (Signed)
Can get echocardiogram to evaluate heart structure, and/or can refer to cardiology. I put in order for echo. Can place referral to cardio if he would like. This doesn't sound respiratory like asthma or other lung issue.

## 2018-12-06 NOTE — Telephone Encounter (Signed)
Referral is in.

## 2018-12-06 NOTE — Telephone Encounter (Signed)
Patient advised. He agreed to the Cardiology referral.

## 2018-12-19 ENCOUNTER — Ambulatory Visit (HOSPITAL_BASED_OUTPATIENT_CLINIC_OR_DEPARTMENT_OTHER): Admission: RE | Admit: 2018-12-19 | Payer: BLUE CROSS/BLUE SHIELD | Source: Ambulatory Visit

## 2018-12-19 ENCOUNTER — Ambulatory Visit: Payer: BLUE CROSS/BLUE SHIELD | Admitting: Cardiology

## 2019-02-19 ENCOUNTER — Ambulatory Visit (INDEPENDENT_AMBULATORY_CARE_PROVIDER_SITE_OTHER): Payer: BLUE CROSS/BLUE SHIELD | Admitting: Osteopathic Medicine

## 2019-02-19 ENCOUNTER — Ambulatory Visit (INDEPENDENT_AMBULATORY_CARE_PROVIDER_SITE_OTHER): Payer: BLUE CROSS/BLUE SHIELD

## 2019-02-19 ENCOUNTER — Encounter: Payer: Self-pay | Admitting: Osteopathic Medicine

## 2019-02-19 ENCOUNTER — Other Ambulatory Visit: Payer: Self-pay

## 2019-02-19 VITALS — BP 124/69 | HR 90 | Temp 98.0°F | Wt 162.1 lb

## 2019-02-19 DIAGNOSIS — K561 Intussusception: Secondary | ICD-10-CM | POA: Diagnosis not present

## 2019-02-19 DIAGNOSIS — R1031 Right lower quadrant pain: Secondary | ICD-10-CM | POA: Diagnosis not present

## 2019-02-19 LAB — POCT URINALYSIS DIPSTICK
Bilirubin, UA: NEGATIVE
Blood, UA: NEGATIVE
Glucose, UA: NEGATIVE
Ketones, UA: NEGATIVE
Leukocytes, UA: NEGATIVE
Nitrite, UA: NEGATIVE
Protein, UA: NEGATIVE
Spec Grav, UA: 1.015 (ref 1.010–1.025)
Urobilinogen, UA: 0.2 E.U./dL
pH, UA: 6 (ref 5.0–8.0)

## 2019-02-19 LAB — COMPLETE METABOLIC PANEL WITH GFR
AG Ratio: 1.7 (calc) (ref 1.0–2.5)
ALT: 17 U/L (ref 9–46)
AST: 20 U/L (ref 10–40)
Albumin: 4.7 g/dL (ref 3.6–5.1)
Alkaline phosphatase (APISO): 87 U/L (ref 36–130)
BUN: 12 mg/dL (ref 7–25)
CO2: 33 mmol/L — ABNORMAL HIGH (ref 20–32)
Calcium: 9.8 mg/dL (ref 8.6–10.3)
Chloride: 101 mmol/L (ref 98–110)
Creat: 1.03 mg/dL (ref 0.60–1.35)
GFR, Est African American: 109 mL/min/{1.73_m2} (ref >=60)
GFR, Est Non African American: 94 mL/min/{1.73_m2} (ref >=60)
Globulin: 2.7 g/dL (calc) (ref 1.9–3.7)
Glucose, Bld: 95 mg/dL (ref 65–99)
Potassium: 4.2 mmol/L (ref 3.5–5.3)
Sodium: 140 mmol/L (ref 135–146)
Total Bilirubin: 0.5 mg/dL (ref 0.2–1.2)
Total Protein: 7.4 g/dL (ref 6.1–8.1)

## 2019-02-19 LAB — CBC WITH DIFFERENTIAL/PLATELET
Absolute Monocytes: 480 cells/uL (ref 200–950)
Basophils Absolute: 32 cells/uL (ref 0–200)
Basophils Relative: 0.5 %
Eosinophils Absolute: 147 cells/uL (ref 15–500)
Eosinophils Relative: 2.3 %
HCT: 48.5 % (ref 38.5–50.0)
Hemoglobin: 16.7 g/dL (ref 13.2–17.1)
Lymphs Abs: 1331 cells/uL (ref 850–3900)
MCH: 30.6 pg (ref 27.0–33.0)
MCHC: 34.4 g/dL (ref 32.0–36.0)
MCV: 88.8 fL (ref 80.0–100.0)
MPV: 10.8 fL (ref 7.5–12.5)
Monocytes Relative: 7.5 %
Neutro Abs: 4410 cells/uL (ref 1500–7800)
Neutrophils Relative %: 68.9 %
Platelets: 238 10*3/uL (ref 140–400)
RBC: 5.46 10*6/uL (ref 4.20–5.80)
RDW: 12.3 % (ref 11.0–15.0)
Total Lymphocyte: 20.8 %
WBC: 6.4 10*3/uL (ref 3.8–10.8)

## 2019-02-19 LAB — LIPASE: Lipase: 31 U/L (ref 7–60)

## 2019-02-19 MED ORDER — IOPAMIDOL (ISOVUE-300) INJECTION 61%
100.0000 mL | Freq: Once | INTRAVENOUS | Status: AC | PRN
  Administered 2019-02-19: 16:00:00 100 mL via INTRAVENOUS

## 2019-02-19 NOTE — Patient Instructions (Addendum)
Nausea, pain, fever please go to ER!      Clear Liquid Diet, Adult A clear liquid diet is a diet that includes only liquids and semi-liquids that you can see through. You do not eat any food on this diet. Most people need to follow this diet for only a short period of time. You may need to follow a clear liquid diet if:  You develop a medical condition right before or after you have surgery.  You were not able to eat food for a long period of time.  You had a condition that gave you nausea, vomiting, or diarrhea.  You are going to have a procedure or exam to look at parts of your digestive system.  You are going to have bowel surgery. The usual goals of this diet are:  To rest the stomach and digestive system as much as possible.  To help you clear the digestive system before a procedure or exam.  To keep you hydrated.  To make sure you get some calories for energy.  To help you return to your normal eating routine. What are tips for following this plan?  A clear liquid is a liquid or semi-liquid, such as gelatin, that you can see through when you hold it up to a light.  A clear liquid diet does not provide all the nutrients that you need. It is important to choose a variety of the liquids or semi-liquids that are allowed on this diet. That way, you will get as many nutrients as possible.  If you are not sure whether you can have certain items, ask your health care provider. If you have difficulty swallowing thin liquids, you will need to thicken them to prevent breathing in (aspiration). What foods should I eat?   Water and flavored water.  Fruit juices that do not have pulp, such as cranberry juice and apple juice.  Tea and coffee without milk or cream.  Clear bouillon or broth.  Broth-based soups that have been strained.  Flavored gelatin.  Honey.  Sugar water.  Ice or frozen ice pops that do not contain milk, yogurt, fruit pieces, or fruit pulp.  Clear  sodas.  Clear sports drinks. The items listed above may not be a complete list of recommended foods and beverages. Contact a dietitian for more information. What food should I avoid?  Juices that have pulp.  Milk.  Cream or cream-based soups.  Yogurt.  Regular foods that are not clear liquids or semi-liquids. The items listed above may not be a complete list of foods and beverages to avoid. Contact a dietitian for more information. Questions to ask your health care provider:  How long do I need to follow this diet?  Are there any medicines that I should change while on this diet? Summary  A clear liquid diet is a diet that includes liquids and semi-liquids that you can see through.  The goal of this diet is to help you recover by resting your digestive system, keeping you hydrated, and providing nutrients.  Avoid liquids with milk, cream, or pulp while on this diet. This information is not intended to replace advice given to you by your health care provider. Make sure you discuss any questions you have with your health care provider. Document Released: 10/17/2005 Document Revised: 04/09/2018 Document Reviewed: 04/09/2018 Elsevier Interactive Patient Education  2019 Elsevier Inc.     Full Liquid Diet A full liquid diet refers to fluids and foods that are liquid or will become  liquid at room temperature. This diet should only be used for a short period of time to help you recover from illness or surgery. Your health care provider or dietitian will help you determine when it is safe to eat regular foods. What are tips for following this plan?     Reading food labels  Check food labels of nutrition shakes for the amount of protein. Look for nutrition shakes that have at least 8-10 grams of protein in each serving.  Look for drinks, such as milks and juices, that are "fortified" or "enriched." This means that vitamins and minerals have been added. Shopping  Buy premade  nutritional shakes to keep on hand.  To vary your choices, buy different flavors of milks and shakes. Meal planning  Choose flavors and foods that you enjoy.  To make sure you get enough energy from food (calories): ? Eat 3 full liquid meals each day. Have a liquid snack between each meal. ? Drink 6-8 ounces (177-237 ml) of a nutrition supplement shake with meals or as snacks. ? Add protein powder, powdered milk, milk, or yogurt to shakes to increase the amount of protein.  Drink at least one serving a day of citrus fruit juice or fruit juice that has vitamin C added. General guidelines  Before starting the full-liquid diet, check with your health care provider to know what foods you should avoid. These may include full-fat or high-fiber liquids.  You may have any liquid or food that becomes a liquid at room temperature. The food is considered a liquid if it can be poured off a spoon at room temperature.  Do not drink alcohol unless approved by your health care provider.  This diet gives you most of the nutrients that you need for energy, but you may not get enough of certain vitamins, minerals, and fiber. Make sure to talk to your health care provider or dietitian about: ? How many calories you need to eat get day. ? How much fluid you should have each day. ? Taking a multivitamin or a nutritional supplement. What foods are allowed? The items listed may not be a complete list. Talk with your dietitian about what dietary choices are best for you. Grains Thin hot cereal, such as cream of wheat. Soft-cooked pasta or rice pured in soup. Vegetables Pulp-free tomato or vegetable juice. Vegetables pured in soup. Fruits Fruit juice without pulp. Strained fruit pures (seeds and skins removed). Meats and other protein foods Beef, chicken, and fish broths. Powdered protein supplements. Dairy Milk and milk-based beverages, including milk shakes and instant breakfast mixes. Smooth yogurt.  Pured cottage cheese. Beverages Water. Coffee and tea (caffeinated or decaffeinated). Cocoa. Liquid nutritional supplements. Soft drinks. Nondairy milks, such as almond, coconut, rice, or soy milk. Fats and oils Melted margarine and butter. Cream. Canola, almond, avocado, corn, grapeseed, sunflower, and sesame oils. Gravy. Sweets and desserts Custard. Pudding. Flavored gelatin. Smooth ice cream (without nuts or candy pieces). Sherbet. Popsicles. [place] ice. Pudding pops. Seasoning and other foods Salt and pepper. Spices. Cocoa powder. Vinegar. Ketchup. Yellow mustard. Smooth sauces, such as Hollandaise, cheese sauce, or white sauce. Soy sauce. Cream soups. Strained soups. Syrup. Honey. Jelly (without fruit pieces). What foods are not allowed? The items listed may not be a complete list. Talk with your dietitian about what dietary choices are best for you. Grains Whole grains. Pasta. Rice. Cold cereal. Bread. Crackers. Vegetables All whole fresh, frozen, or canned vegetables. Fruits All whole fresh, frozen, or canned fruits. Meats  and other protein foods All cuts of meat, poultry, and fish. Eggs. Tofu and soy protein. Nuts and nut butters. Lunch meat. Sausage. Dairy Hard cheese. Yogurt with fruit chunks. Fats and oils Coconut oil. Palm oil. Lard. Cold butter. Sweets and desserts Ice cream or other frozen desserts that have any solids in them or on top, such as nuts, chocolate chips, and pieces of cookies. Cakes. Cookies. Candy. Seasoning and other foods Stone-ground mustards. Soups with chunks or pieces. Summary  A full liquid diet refers to fluids and foods that are liquid or will become liquid at room temperature.  This diet should only be used for a short period of time to help you recover from illness or surgery. Ask your health care provider or dietitian when it is safe for you to eat regular foods.  To make sure you get enough calories and nutrients, eat 3 meals each day with  snacks between. Drink premade nutrition supplement shakes or add protein powder to homemade shakes. Take a vitamin and mineral supplement as told by your health care provider. This information is not intended to replace advice given to you by your health care provider. Make sure you discuss any questions you have with your health care provider. Document Released: 10/17/2005 Document Revised: 11/30/2016 Document Reviewed: 11/30/2016 Elsevier Interactive Patient Education  2019 [employer].

## 2019-02-19 NOTE — Progress Notes (Addendum)
HPI: Larry Mercado is a 38 y.o. male who  has a past medical history of Allergic rhinitis, Chronic back pain, Dyslipidemia, Dyspepsia, GERD (gastroesophageal reflux disease), Groin pain, right lower quadrant, IBS (irritable bowel syndrome), and Sebaceous cyst.  he presents to [facility] today, 02/19/19,  for chief complaint of: Abdominal pain  . Location: RLQ  . Quality: bloating, uncomfortable, pressure . Duration: few days ago  . Assoc signs/symptoms: no diarrhea or nausea/vomiting       Past medical, surgical, social and family history reviewed and updated as necessary.   Current medication list and allergy/intolerance information reviewed:    Current Outpatient Medications  Medication Sig Dispense Refill  . ketoconazole (NIZORAL) 2 % cream     . SOOLANTRA 1 % CREA      No current facility-administered medications for this visit.     Allergies  Allergen Reactions  . Abilify [Aripiprazole]     Dizziness       Review of Systems:  Constitutional:  No  fever, no chills, No recent illness, No unintentional weight changes. No significant fatigue.   HEENT: No  headache, no vision change, no hearing change, No sore throat, No  sinus pressure  Cardiac: No  chest pain, No  pressure, No palpitations  Respiratory:  No  shortness of breath. No  Cough  Gastrointestinal: +abdominal pain, No  nausea, No  vomiting,  No  blood in stool, No  diarrhea  Musculoskeletal: No new myalgia/arthralgia  Skin: No  Rash  Neurologic: No  weakness, No  dizziness  Exam:  BP 124/69 (BP Location: Left Arm, Patient Position: Sitting, Cuff Size: Normal)   Pulse 90   Temp 98 F (36.7 C) (Oral)   Wt 162 lb 1.6 oz (73.5 kg)   BMI 22.61 kg/m   Constitutional: VS see above. General Appearance: alert, well-developed, well-nourished, NAD  Eyes: Normal lids and conjunctive, non-icteric sclera  Ears, Nose, Mouth, Throat: MMM, Normal external inspection  ears/nares/mouth/lips/gums.    Neck: No masses, trachea midline. No tenderness/mass appreciated. No lymphadenopathy  Respiratory: Normal respiratory effort. no wheeze, no rhonchi, no rales  Cardiovascular: S1/S2 normal, no murmur, no rub/gallop auscultated. RRR. No lower extremity edema.   Gastrointestinal: (+)TTP RLQ no rebound or guarding, no masses. Bowel sounds normal.  Musculoskeletal: Gait normal.   Neurological: Normal balance/coordination. No tremor.   Skin: warm, dry, intact.   Psychiatric: Normal judgment/insight. Normal mood and affect.   Results for orders placed or performed in visit on 02/19/19 (from the past 72 hour(s))  POCT Urinalysis Dipstick     Status: None   Collection Time: 02/19/19  2:59 PM  Result Value Ref Range   Color, UA Yellow    Clarity, UA Clear    Glucose, UA Negative Negative   Bilirubin, UA Negative    Ketones, UA Negative    Spec Grav, UA 1.015 1.010 - 1.025   Blood, UA Negative    pH, UA 6.0 5.0 - 8.0   Protein, UA Negative Negative   Urobilinogen, UA 0.2 0.2 or 1.0 E.U./dL   Nitrite, UA Negative    Leukocytes, UA Negative Negative   Appearance     Odor      Ct Abdomen Pelvis W Contrast  Result Date: 02/19/2019 CLINICAL DATA:  Acute right lower quadrant abdominal pain. EXAM: CT ABDOMEN AND PELVIS WITH CONTRAST TECHNIQUE: Multidetector CT imaging of the abdomen and pelvis was performed using the standard protocol following bolus administration of intravenous contrast. CONTRAST:  100mL ISOVUE-300 IOPAMIDOL (ISOVUE-300) INJECTION 61% COMPARISON:  CT scan of April 27, 2009. FINDINGS: Lower chest: No acute abnormality. Hepatobiliary: No focal liver abnormality is seen. No gallstones, gallbladder wall thickening, or biliary dilatation. Pancreas: Unremarkable. No pancreatic ductal dilatation or surrounding inflammatory changes. Spleen: Normal in size without focal abnormality. Adrenals/Urinary Tract: Adrenal glands are unremarkable. Kidneys are  normal, without renal calculi, focal lesion, or hydronephrosis. Bladder is unremarkable. Stomach/Bowel: The stomach and appendix appear normal. There appears to be focal intussusception involving the proximal jejunum best seen on image number 40 of series 5. It measures approximately 3 cm in length. There does not appear to be any significant bowel dilatation or inflammation. Vascular/Lymphatic: No significant vascular findings are present. No enlarged abdominal or pelvic lymph nodes. Reproductive: Prostate is unremarkable. Other: No abdominal wall hernia or abnormality. No abdominopelvic ascites. Musculoskeletal: No acute or significant osseous findings. IMPRESSION: Focal intussusception is seen involving the proximal jejunum measuring approximately 3 cm in length. No other abnormality seen in the abdomen or pelvis. Electronically Signed   By: Lupita RaiderJames  Green Jr M.D.   On: 02/19/2019 16:38         ASSESSMENT/PLAN: The primary encounter diagnosis was Intussusception intestine (HCC). A diagnosis of Right lower quadrant abdominal pain was also pertinent to this visit.  Images personally reviewed. Area of concern is not corresponding to area of pain. Spoke to surgeon on call, recommended watchful waiting unless labs were significantly abnormal. Nothing on CT to really explain pain but nothing obvious seen.      Orders Placed This Encounter  Procedures  . POCT Urinalysis Dipstick    Patient Instructions  Nausea, pain, fever please go to ER!   UpToDate information printed for patient.      ADDENDUM 02/21/19 12:06 PM   [facility]  He reports typically these intussusception go away on their own, often they'll go in surgically and never find anything.  Recommended liquid diet for now, stay really well hydrated If nausea/throwing up or worsening pain  Consider capsule endoscopy via GI at some point   Recommend walking around, laying in different  positions Emailed clear/full liquid diet info to patient Will call on Monday to recheck    Visit summary with medication list and pertinent instructions was printed for patient to review. All questions at time of visit were answered - patient instructed to contact office with any additional concerns or updates. ER/RTC precautions were reviewed with the patient.   Follow-up plan: Return if symptoms worsen or fail to improve.   .  Please note: voice recognition software was used to produce this document, and typos may escape review. Please contact Dr. Lyn HollingsheadAlexander for any needed clarifications.    Total time spent: 40 minutes, greater than 50% face to face counseling and coordinating care for patient for the dx listed in assessment

## 2019-02-20 ENCOUNTER — Telehealth: Payer: Self-pay

## 2019-02-20 ENCOUNTER — Other Ambulatory Visit: Payer: Self-pay | Admitting: Osteopathic Medicine

## 2019-02-20 DIAGNOSIS — K561 Intussusception: Secondary | ICD-10-CM

## 2019-02-20 MED ORDER — AMPHETAMINE-DEXTROAMPHET ER 10 MG PO CP24: 10.0000 mg | ORAL_CAPSULE | ORAL | 0 refills | Status: DC

## 2019-02-20 NOTE — Progress Notes (Signed)
Called patient and reviewed results, on imaging the intussusception is on the L so that makes more sene anyway w/ his pain. I think would be resonable to get him in to surgery, see I they want to admit for obs or manage out patient or go to OR.

## 2019-02-20 NOTE — Telephone Encounter (Signed)
Pt left a vm msg stating he was never given a call to see the surgeon today. Requesting feedback from provider.

## 2019-02-21 NOTE — Telephone Encounter (Signed)
Spoke with Pt, advised of where the referral was sent. Told him to contact office if they cannot see him today so we can send him somewhere else.

## 2019-02-21 NOTE — Addendum Note (Signed)
Addended by: Deirdre Pippins on: 02/21/2019 12:22 PM   Modules accepted: Level of Service

## 2019-02-21 NOTE — Telephone Encounter (Signed)
Routine to referrals, I think Candise Bowens was working on this yesterday, will send to [facility] also

## 2019-02-21 NOTE — Telephone Encounter (Signed)
I spoke with [facility] they stated he needed to go to ED I let Dr. Mervyn Skeeters know and she was going to call [facility] then let me know what I needed to do.

## 2019-02-25 ENCOUNTER — Telehealth: Payer: Self-pay | Admitting: Osteopathic Medicine

## 2019-02-25 NOTE — Telephone Encounter (Signed)
Please call patient: Just checking in on his abdominal pain, how is it today?  If persistent pain, especially if nausea/vomiting, we should get him in to the surgeon. Referral is pended if needed.   If he's feeling improved, let me know      (02/21/19 12:06 PM  Spoke to Dr Wenda Low at [facility]  He reports typically these intussusception go away on their own, often they'll go in surgically and never find anything.  Recommended liquid diet for now, stay really well hydrated If nausea/throwing up or worsening pain go to ER Consider capsule endoscopy via GI at some point   Recommend walking around, laying in different positions Emailed clear/full liquid diet info to patient Will call on Monday to recheck)

## 2019-02-25 NOTE — Telephone Encounter (Signed)
Larry Mercado states he feels better and then will have a slight pain in his abdomen. I did advise to make sure he is getting plenty of water, stay on the liquid diet and walk. I advised him to go to the ED if worsening of pain, nausea or vomiting. He will call back tomorrow with an update.

## 2019-02-26 NOTE — Telephone Encounter (Signed)
Larry Mercado called for a update on his status. He states yesterday was his best day. He is doing better today. He wanted to know if he could move to soft foods today. He is having bowel movements. He reports it being loose and soft stool. Please advise.   If ok to proceed to soft foods I advised for the next 12 hours, progress to eating soups (avoiding cream soups), dry toast, soda crackers, white rice, pretzels, bananas, applesauce and potatoes.  Progress to a regular diet after soft formed stools occur. Avoid dairy products, citrus juices, raw fruits and vegetables, and fried or spicy foods for 2 to 5 days.

## 2019-02-26 NOTE — Telephone Encounter (Signed)
Would agree with nurse note. Advancing diet as tolerated would be fine!

## 2019-02-26 NOTE — Telephone Encounter (Signed)
Noted, thanks! Will leave encounter open for now

## 2019-02-28 NOTE — Telephone Encounter (Signed)
Patient advised.  He is feeling better.

## 2019-03-04 ENCOUNTER — Telehealth: Payer: Self-pay

## 2019-03-04 MED ORDER — DICYCLOMINE HCL 20 MG PO TABS: 20.0000 mg | ORAL_TABLET | Freq: Three times a day (TID) | ORAL | 3 refills | Status: DC | PRN

## 2019-03-04 NOTE — Telephone Encounter (Signed)
I think we had discussed Bentyl, sent this to pharmacy as requested apologies for the mistake on my end!

## 2019-03-04 NOTE — Telephone Encounter (Signed)
Pt has been updated of new med sent to local pharmacy. No other inquiries during call.

## 2019-03-04 NOTE — Telephone Encounter (Signed)
Pt left a vm msg requesting an update on a rx. As per pt - provider was supposed to send in a rx for his stomach. Pt does not recall name of rx. Requesting rx to be sent to [facility]. Pls advise, thanks.

## 2019-03-06 ENCOUNTER — Telehealth: Payer: Self-pay

## 2019-03-06 NOTE — Telephone Encounter (Signed)
RX for Adderall was printed for pt on 02/20/19, but [facility] never received RX.  Can we try to re-sent this RX electronically so pt can have filled?  RX pended.   Note sent to provider in office- PCP out of office until next week.

## 2019-03-07 MED ORDER — AMPHETAMINE-DEXTROAMPHET ER 10 MG PO CP24: 10.0000 mg | ORAL_CAPSULE | ORAL | 0 refills | Status: DC

## 2019-03-07 NOTE — Telephone Encounter (Signed)
Left pt msg advising RX sent  

## 2019-03-07 NOTE — Telephone Encounter (Signed)
Adderall sent electronically. 

## 2019-03-12 ENCOUNTER — Telehealth: Payer: Self-pay

## 2019-03-12 NOTE — Telephone Encounter (Signed)
Pt was informed by [facility] that adderall med requires PA. Pls initiate and contact pt with updated info. Thanks.

## 2019-03-12 NOTE — Telephone Encounter (Signed)
Does this patient have [employer]? Please advise?

## 2019-03-12 NOTE — Telephone Encounter (Signed)
The last insurance he gave [place] was [place] [employer]) to [employer]. That was in January this year.

## 2019-03-13 NOTE — Telephone Encounter (Signed)
Pt called for update - he does have [employer].

## 2019-03-14 NOTE — Telephone Encounter (Signed)
Unable to verify insurance. Per [facility] the patient has [facility].  I have attempted [facility] [employer] with no success.

## 2019-03-18 NOTE — Telephone Encounter (Signed)
Larry Mercado called and states he does have [employer] with [employer]. He will need a PA for Adderall. I submitted the PA on Covermymeds. Awaiting a response.

## 2019-03-18 NOTE — Telephone Encounter (Signed)
Approved today  Your PA request has been approved. Additional information will be provided in the approval communication. (Message [id number])

## 2019-03-27 ENCOUNTER — Telehealth: Payer: Self-pay

## 2019-03-27 DIAGNOSIS — R1084 Generalized abdominal pain: Secondary | ICD-10-CM

## 2019-03-27 NOTE — Telephone Encounter (Signed)
referral is in .

## 2019-03-27 NOTE — Telephone Encounter (Signed)
Larry Mercado would like to have a referral to GI for on going abdominal pain and bloating. He reports it is not as bad as before but it still flares up from time to time. Please advise.

## 2019-03-28 ENCOUNTER — Other Ambulatory Visit: Payer: Self-pay

## 2019-03-28 ENCOUNTER — Encounter: Payer: Self-pay | Admitting: Gastroenterology

## 2019-03-28 ENCOUNTER — Telehealth: Payer: Self-pay | Admitting: Gastroenterology

## 2019-03-28 ENCOUNTER — Telehealth (INDEPENDENT_AMBULATORY_CARE_PROVIDER_SITE_OTHER): Payer: BLUE CROSS/BLUE SHIELD | Admitting: Gastroenterology

## 2019-03-28 VITALS — Ht 71.75 in | Wt 155.0 lb

## 2019-03-28 DIAGNOSIS — R1084 Generalized abdominal pain: Secondary | ICD-10-CM

## 2019-03-28 DIAGNOSIS — K589 Irritable bowel syndrome without diarrhea: Secondary | ICD-10-CM | POA: Diagnosis not present

## 2019-03-28 DIAGNOSIS — K219 Gastro-esophageal reflux disease without esophagitis: Secondary | ICD-10-CM

## 2019-03-28 DIAGNOSIS — K561 Intussusception: Secondary | ICD-10-CM | POA: Diagnosis not present

## 2019-03-28 NOTE — Progress Notes (Signed)
Chief Complaint: Abdominal pain, bloating, jejunal intussusception   Referring Provider:     Sunnie Nielsen, DO   HPI:    Due to current restrictions/limitations of in-office visits due to the COVID-19 pandemic, this scheduled clinical appointment was converted to a telehealth virtual consultation using Doximity.  -Time of medical discussion: 35 minutes -The patient did consent to this virtual visit and is aware of possible charges through their insurance for this visit.  -Names of all parties present: Larry Mercado (patient), Doristine Locks, DO, [facility] (physician) -Patient location: Home -Physician location: Office  Larry Mercado is a 38 y.o. male with a history of allergic rhinitis, ADD, chronic back pain, hyperlipidemia, IBS, GERD, referred to the [facility] for evaluation of abdominal pain and abdominal bloating along with recently diagnosed jejunal intussusception.  He states pain started in 01/2019 described as intermittent right sided discomfort, and was seen by his PCM for this on 02/19/2019.  CT that day notable for focal intussusception in the proximal jejunum measuring 3 cm in length with otherwise normal-appearing abdomen.  No bowel wall thickening.  Otherwise normal CBC, CMP, lipase, UA.  Treated with liquid diet and slowly advanced back to baseline PO intake (avoiding fried foods). Sxs abated, but has recurred in last couple of days. Same sxs as last month.  Now can be right or left sided pain.  Pain started 4 days ago, and essentially lasts throughout the day, as a lingering discomfort.  Worsens 10-20 mins post PO, independent of food type. Sxs continue to occur despite bland and liquid diet.  No nocturnal pain. No changes in bowel habits, hematochezia, melena, current jelly stools. No previous abdominal surgery.  No known personal or family history of IBD, Celiac disease, GI malignancy, CRC, liver or pancreaticobiliary disease.  For his GERD, he  endorses rare HB and regurgitation. Controlled with dietary modifications and occasional prn OTC antacids. No dysphagia.   For his history of IBS, describes years of abdominal discomfort and bloating.  Occasional changes in bowel habits (loose stools).  Back pain is different from current symptoms. Has had EGD, colonoscopy, and abdominal US at outside facilities in the past, without any findings.  No records for review in EMR. Done in approx 2012.   Past medical history, past surgical history, social history, family history, medications, and allergies reviewed in the chart and with patient.    Past Medical History:  Diagnosis Date  . Allergic rhinitis   . Chronic back pain   . Dyslipidemia   . Dyspepsia   . GERD (gastroesophageal reflux disease)   . Groin pain, right lower quadrant   . Hemorrhoids    internal, per patient  . IBS (irritable bowel syndrome)   . Sebaceous cyst   . Vertigo      Past Surgical History:  Procedure Laterality Date  . COLONOSCOPY  2012   In [place] he believes but don't know where. Was told that he had internal hemorrhoids  . ESOPHAGOGASTRODUODENOSCOPY  2012   believes it was done same time with colonoscopy  . HYDROCELE EXCISION / REPAIR    . MOUTH SURGERY    . SPERMATIC VEIN LIGATION     Family History  Problem Relation Age of Onset  . Cancer Father        brain  . Heart disease Paternal Grandfather   . Cancer Maternal Grandmother        mesothelioma  . Colon cancer Neg  Hx   . Esophageal cancer Neg Hx    Social History   Tobacco Use  . Smoking status: Never Smoker  . Smokeless tobacco: Never Used  Substance Use Topics  . Alcohol use: Not Currently    Comment: rarely   . Drug use: No   Current Outpatient Medications  Medication Sig Dispense Refill  . amphetamine-dextroamphetamine (ADDERALL XR) 10 MG 24 hr capsule Take 1 capsule (10 mg total) by mouth every morning. 30 capsule 0  . ketoconazole (NIZORAL) 2 % cream as needed.     .  SOOLANTRA 1 % CREA as needed.     . dicyclomine (BENTYL) 20 MG tablet Take 1 tablet (20 mg total) by mouth 3 (three) times daily as needed for spasms (abdominal pain). (Patient not taking: Reported on 03/28/2019) 30 tablet 3   No current facility-administered medications for this visit.    Allergies  Allergen Reactions  . Abilify [Aripiprazole]     Dizziness Patient said that he said he is not allergic to that medication      Review of Systems: All systems reviewed and negative except where noted in HPI.     Physical Exam:    Physical exam not completed due to the nature of this telehealth communication.  Patient was otherwise alert and oriented and well communicative.   ASSESSMENT AND PLAN;   1) Intussusception 2) generalized abdominal pain: CT with proximal jejunal intussusception, 3 cm segment, without obvious lead point.  Interestingly, no surrounding inflammatory changes.  Discussed the broad DDX for intussusception, to include malignant and benign etiologies, along with no clear etiology and a good portion of cases.  Did discuss that these can also sometimes be incidentally noted, but given his active symptoms, feel this is likely contributing clinically.  - Discussed further diagnostic work-up with VCE versus MR enterography at length and he prefers the latter to start - Ordered MR enterography.  If this is unrevealing for etiology/lead point, plan for VCE for intraluminal evaluation - Further intervention (i.e. single balloon enteroscopy versus laparoscopy) pending studies -To follow-up with me after studies complete -Check celiac panel  3) IBS: History of previously diagnosed IBS. -Can trial course of Bentyl as needed - Continue dietary modifications as has been employed in the past  4) GERD: Rare, intermittent symptoms.  Controlled with dietary modifications and rare use of OTC antacids.  No change at this time.    Shellia CleverlyVito V Kaloni Bisaillon, DO, FACG  03/28/2019, 1:49 PM    Sunnie NielsenAlexander, Natalie, DO

## 2019-03-28 NOTE — Telephone Encounter (Signed)
Pt called and stated that he was advised by the doctor that he will be sending in some pain medication for his stomach. He was not sure what the name was of the medication but was following up to see when it would be sent in. Please call and advise thanks.

## 2019-03-28 NOTE — Patient Instructions (Signed)
If you are age 36 or older, your body mass index should be between 23-30. Your Body mass index is 21.17 kg/m. If this is out of the aforementioned range listed, please consider follow up with your Primary Care Provider.  If you are age 23 or younger, your body mass index should be between 19-25. Your Body mass index is 21.17 kg/m. If this is out of the aformentioned range listed, please consider follow up with your Primary Care Provider.   You have been scheduled for an MRI at [facility] on 04/08/19. Your appointment time is 3pm. Please arrive 2 hours prior to your appointment (1pm)  time for registration purposes. Please make certain not to have anything to eat or drink 6 hours prior to your test. In addition, if you have any metal in your body, have a pacemaker or defibrillator, please be sure to let your ordering physician know. Should you need to reschedule, please call [phone] to do so.  Your provider has requested that you go to the basement level for lab work at [facility] ([address] [place]). Press "B" on the elevator. The lab is located at the first door on the left as you exit the elevator.  It was a pleasure to see you today!  Vito Cirigliano, D.O.

## 2019-03-29 NOTE — Telephone Encounter (Signed)
Please advise 

## 2019-03-29 NOTE — Telephone Encounter (Signed)
Please place Rx for Levsin 0.125 mg PO prn Q6 hours. #60, RF3.  Thanks!

## 2019-04-01 ENCOUNTER — Other Ambulatory Visit: Payer: Self-pay

## 2019-04-01 MED ORDER — HYOSCYAMINE SULFATE 0.125 MG SL SUBL: 0.1250 mg | SUBLINGUAL_TABLET | Freq: Four times a day (QID) | SUBLINGUAL | 3 refills | Status: DC | PRN

## 2019-04-01 NOTE — Telephone Encounter (Signed)
Sent medication to pharmacy and notified patient of new orders. Patient verbalized understanding of new medication and how to take.

## 2019-04-01 NOTE — Progress Notes (Signed)
Notified patient of orders, patient verbalized understanding.

## 2019-04-03 ENCOUNTER — Telehealth: Payer: Self-pay

## 2019-04-03 NOTE — Telephone Encounter (Signed)
Pt left a vm msg stating that the low dose of adderall still gives him palpitations. Requesting an alternative or non stimulant rx be sent to [facility]. Pls advise, thanks.

## 2019-04-04 ENCOUNTER — Other Ambulatory Visit: Payer: Self-pay

## 2019-04-04 ENCOUNTER — Other Ambulatory Visit: Payer: Self-pay | Admitting: Gastroenterology

## 2019-04-04 DIAGNOSIS — K561 Intussusception: Secondary | ICD-10-CM

## 2019-04-04 DIAGNOSIS — R1084 Generalized abdominal pain: Secondary | ICD-10-CM

## 2019-04-04 NOTE — Telephone Encounter (Signed)
Let's schedule a virtual visit on Friday to go over options.  We will have patient contact his insurance to see if Wilhemena Durie is on their formulary for ADHD.

## 2019-04-04 NOTE — Progress Notes (Signed)
Added MR Entero Abdomen to the previous order for Pelvis w/ wo contrast.

## 2019-04-04 NOTE — Telephone Encounter (Signed)
Appointment has been scheduled. No further questions at this time.

## 2019-04-05 ENCOUNTER — Encounter: Payer: Self-pay | Admitting: Osteopathic Medicine

## 2019-04-05 ENCOUNTER — Ambulatory Visit (INDEPENDENT_AMBULATORY_CARE_PROVIDER_SITE_OTHER): Payer: BC Managed Care – PPO | Admitting: Osteopathic Medicine

## 2019-04-05 VITALS — Wt 144.0 lb

## 2019-04-05 DIAGNOSIS — F988 Other specified behavioral and emotional disorders with onset usually occurring in childhood and adolescence: Secondary | ICD-10-CM | POA: Diagnosis not present

## 2019-04-05 MED ORDER — ATOMOXETINE HCL 40 MG PO CAPS: 40.0000 mg | ORAL_CAPSULE | Freq: Every day | ORAL | 1 refills | Status: DC

## 2019-04-05 NOTE — Progress Notes (Signed)
Virtual Visit via Video (App used: Doximity) Note  I connected with      Larry Mercado on 04/05/19 at 11:20 by a telemedicine application and verified that I am speaking with the correct person using two identifiers.  Patient is at home I am working from home    I discussed the limitations of evaluation and management by telemedicine and the availability of in person appointments. The patient expressed understanding and agreed to proceed.  History of Present Illness: Larry FinlayJeremy Venturella is a 38 y.o. male who would like to discuss  Chief Complaint  Patient presents with  . Medication Problem    Palpitations with Adderall. Requesting an alternative or non stimulant rx.    Ongoing palpitations with stimulant medications.  He thinks he has been on several stimulants in the past, when he was being treated by ADHD specialist in [place] he remembers them switching medications a lot even though his symptoms were fairly well controlled.  At this point, he like to try something non-stimulant.   Observations/Objective: Wt 144 lb (65.3 kg)   BMI 19.67 kg/m  BP Readings from Last 3 Encounters:  02/19/19 124/69  11/07/18 115/70  06/24/18 124/78   Exam: Normal Speech.  Chief Complaint  Patient presents with  . Medication Problem    Palpitations with Adderall. Requesting an alternative or non stimulant rx.   Chest pounding     Lab and Radiology Results No results found for this or any previous visit (from the past 72 hour(s)). No results found.     Assessment and Plan: 38 y.o. male with The encounter diagnosis was Attention deficit disorder (ADD) in adult.  Will trial Strattera, patient advised to let me know prohibitively expensive or causing any other problems.   PDMP not reviewed this encounter. No orders of the defined types were placed in this encounter.  Meds ordered this encounter  Medications  . atomoxetine (STRATTERA) 40 MG capsule    Sig: Take 1 capsule (40 mg  total) by mouth daily. Can increase to 2 capsule (80 mg total) daily after 2 weeks.    Dispense:  60 capsule    Refill:  1     Instructions sent via MyChart. If MyChart not available, pt was given option for info via personal e-mail w/ no guarantee of protected health info over unsecured e-mail communication, and MyChart sign-up instructions were included.   Follow Up Instructions: Return in about 4 weeks (around 05/03/2019) for Follow-up ADHD medication change.    I discussed the assessment and treatment plan with the patient. The patient was provided an opportunity to ask questions and all were answered. The patient agreed with the plan and demonstrated an understanding of the instructions.   The patient was advised to call back or seek an in-person evaluation if any new concerns, if symptoms worsen or if the condition fails to improve as anticipated.  25 minutes of non-face-to-face time was provided during this encounter.                      Historical information moved to improve visibility of documentation.  Past Medical History:  Diagnosis Date  . Allergic rhinitis   . Chronic back pain   . Dyslipidemia   . Dyspepsia   . GERD (gastroesophageal reflux disease)   . Groin pain, right lower quadrant   . Hemorrhoids    internal, per patient  . IBS (irritable bowel syndrome)   . Sebaceous cyst   .  Vertigo    Past Surgical History:  Procedure Laterality Date  . COLONOSCOPY  2012   In [place] he believes but don't know where. Was told that he had internal hemorrhoids  . ESOPHAGOGASTRODUODENOSCOPY  2012   believes it was done same time with colonoscopy  . HYDROCELE EXCISION / REPAIR    . MOUTH SURGERY    . SPERMATIC VEIN LIGATION     Social History   Tobacco Use  . Smoking status: Never Smoker  . Smokeless tobacco: Never Used  Substance Use Topics  . Alcohol use: Not Currently    Comment: rarely    family history includes Cancer in his father and  maternal grandmother; Heart disease in his paternal grandfather.  Medications: Current Outpatient Medications  Medication Sig Dispense Refill  . ketoconazole (NIZORAL) 2 % cream as needed.     . SOOLANTRA 1 % CREA as needed.     Marland Kitchen atomoxetine (STRATTERA) 40 MG capsule Take 1 capsule (40 mg total) by mouth daily. Can increase to 2 capsule (80 mg total) daily after 2 weeks. 60 capsule 1   No current facility-administered medications for this visit.    Allergies  Allergen Reactions  . Abilify [Aripiprazole]     Dizziness Patient said that he said he is not allergic to that medication     PDMP not reviewed this encounter. No orders of the defined types were placed in this encounter.  Meds ordered this encounter  Medications  . atomoxetine (STRATTERA) 40 MG capsule    Sig: Take 1 capsule (40 mg total) by mouth daily. Can increase to 2 capsule (80 mg total) daily after 2 weeks.    Dispense:  60 capsule    Refill:  1

## 2019-04-08 ENCOUNTER — Ambulatory Visit (HOSPITAL_COMMUNITY)
Admission: RE | Admit: 2019-04-08 | Discharge: 2019-04-08 | Disposition: A | Discharge status: Home / Self Care | Payer: BC Managed Care – PPO | Source: Ambulatory Visit | Attending: Gastroenterology | Admitting: Gastroenterology

## 2019-04-08 ENCOUNTER — Other Ambulatory Visit: Payer: Self-pay

## 2019-04-08 DIAGNOSIS — R1084 Generalized abdominal pain: Secondary | ICD-10-CM | POA: Diagnosis not present

## 2019-04-08 DIAGNOSIS — K561 Intussusception: Secondary | ICD-10-CM | POA: Insufficient documentation

## 2019-04-08 DIAGNOSIS — R109 Unspecified abdominal pain: Secondary | ICD-10-CM | POA: Diagnosis not present

## 2019-04-08 MED ORDER — GADOBUTROL 1 MMOL/ML IV SOLN
6.0000 mL | Freq: Once | INTRAVENOUS | Status: AC | PRN
  Administered 2019-04-08: 6 mL via INTRAVENOUS

## 2019-04-08 MED ORDER — BARIUM SULFATE 0.1 % PO SUSP
ORAL | Status: AC
  Filled 2019-04-08: qty 3

## 2019-04-09 ENCOUNTER — Ambulatory Visit: Payer: BC Managed Care – PPO | Admitting: Osteopathic Medicine

## 2019-05-07 ENCOUNTER — Ambulatory Visit (INDEPENDENT_AMBULATORY_CARE_PROVIDER_SITE_OTHER): Payer: BC Managed Care – PPO | Admitting: Osteopathic Medicine

## 2019-05-07 ENCOUNTER — Encounter: Payer: Self-pay | Admitting: Osteopathic Medicine

## 2019-05-07 VITALS — Wt 153.0 lb

## 2019-05-07 DIAGNOSIS — F988 Other specified behavioral and emotional disorders with onset usually occurring in childhood and adolescence: Secondary | ICD-10-CM | POA: Diagnosis not present

## 2019-05-07 MED ORDER — ADZENYS XR-ODT 3.1 MG PO TBED: 3.1000 mg | EXTENDED_RELEASE_TABLET | Freq: Every day | ORAL | 0 refills | Status: DC

## 2019-05-07 NOTE — Progress Notes (Signed)
Virtual Visit via  Video (App used: Doximity) Note  I connected with      Larry Mercado on 05/07/19 at 10:30 by a telemedicine application and verified that I am speaking with the correct person using two identifiers.  Patient is at home I am in office   I discussed the limitations of evaluation and management by telemedicine and the availability of in person appointments. The patient expressed understanding and agreed to proceed.  History of Present Illness: Larry Mercado is a 38 y.o. male who would like to discuss ADHD   Last visit we switched him to non-stimulant Strattera 40 mg to start increasing to 80 mg after 2 weeks. Stopped this d/t insomnia, dizziness. He'd like to try low dose of a medication his son is taking and doing well on.      Observations/Objective: Wt 153 lb (69.4 kg)   BMI 20.90 kg/m  BP Readings from Last 3 Encounters:  02/19/19 124/69  11/07/18 115/70  06/24/18 124/78   Exam: Normal Speech.  NAD  Lab and Radiology Results No results found for this or any previous visit (from the past 72 hour(s)). No results found.     Assessment and Plan: 38 y.o. male with The encounter diagnosis was Attention deficit disorder (ADD) in adult.   PDMP not reviewed this encounter. No orders of the defined types were placed in this encounter.  Meds ordered this encounter  Medications  . Amphetamine ER (ADZENYS XR-ODT) 3.1 MG TBED    Sig: Take 3.1-6.2 mg by mouth daily.    Dispense:  60 tablet    Refill:  0      Follow Up Instructions: Return in about 4 weeks (around 06/04/2019), or recheck ADHD on new medications.    I discussed the assessment and treatment plan with the patient. The patient was provided an opportunity to ask questions and all were answered. The patient agreed with the plan and demonstrated an understanding of the instructions.   The patient was advised to call back or seek an in-person evaluation if any new concerns, if symptoms  worsen or if the condition fails to improve as anticipated.  25 minutes of non-face-to-face time was provided during this encounter.                      Historical information moved to improve visibility of documentation.  Past Medical History:  Diagnosis Date  . Allergic rhinitis   . Chronic back pain   . Dyslipidemia   . Dyspepsia   . GERD (gastroesophageal reflux disease)   . Groin pain, right lower quadrant   . Hemorrhoids    internal, per patient  . IBS (irritable bowel syndrome)   . Sebaceous cyst   . Vertigo    Past Surgical History:  Procedure Laterality Date  . COLONOSCOPY  2012   In [place] he believes but don't know where. Was told that he had internal hemorrhoids  . ESOPHAGOGASTRODUODENOSCOPY  2012   believes it was done same time with colonoscopy  . HYDROCELE EXCISION / REPAIR    . MOUTH SURGERY    . SPERMATIC VEIN LIGATION     Social History   Tobacco Use  . Smoking status: Never Smoker  . Smokeless tobacco: Never Used  Substance Use Topics  . Alcohol use: Not Currently    Comment: rarely    family history includes Cancer in his father and maternal grandmother; Heart disease in his paternal grandfather.  Medications: Current  Outpatient Medications  Medication Sig Dispense Refill  . Amphetamine ER (ADZENYS XR-ODT) 3.1 MG TBED Take 3.1-6.2 mg by mouth daily. 60 tablet 0  . atomoxetine (STRATTERA) 40 MG capsule Take 1 capsule (40 mg total) by mouth daily. Can increase to 2 capsule (80 mg total) daily after 2 weeks. 60 capsule 1  . ketoconazole (NIZORAL) 2 % cream as needed.     . SOOLANTRA 1 % CREA as needed.      No current facility-administered medications for this visit.    Allergies  Allergen Reactions  . Abilify [Aripiprazole]     Dizziness Patient said that he said he is not allergic to that medication     PDMP not reviewed this encounter. No orders of the defined types were placed in this encounter.  Meds ordered  this encounter  Medications  . Amphetamine ER (ADZENYS XR-ODT) 3.1 MG TBED    Sig: Take 3.1-6.2 mg by mouth daily.    Dispense:  60 tablet    Refill:  0

## 2019-06-04 ENCOUNTER — Encounter: Payer: Self-pay | Admitting: Osteopathic Medicine

## 2019-06-04 ENCOUNTER — Ambulatory Visit (INDEPENDENT_AMBULATORY_CARE_PROVIDER_SITE_OTHER): Payer: BC Managed Care – PPO | Admitting: Osteopathic Medicine

## 2019-06-04 DIAGNOSIS — F988 Other specified behavioral and emotional disorders with onset usually occurring in childhood and adolescence: Secondary | ICD-10-CM | POA: Diagnosis not present

## 2019-06-04 MED ORDER — AMPHETAMINE ER 3.1 MG PO TBED: 3.1000 mg | EXTENDED_RELEASE_TABLET | Freq: Every day | ORAL | 0 refills | Status: DC

## 2019-06-04 NOTE — Progress Notes (Signed)
Virtual Visit via Video (App used: Doximity) Note  I connected with      Larry Mercado on 06/04/19 at 2:00 PM by a telemedicine application and verified that I am speaking with the correct person using two identifiers.  Patient is at home I am in office    I discussed the limitations of evaluation and management by telemedicine and the availability of in person appointments. The patient expressed understanding and agreed to proceed.  History of Present Illness: Larry Mercado is a 38 y.o. male who would like to discuss ADHD meds   Working well but was expensive.  Very occasional palpitations but minimal, worth the benefit of the medciation in patient's opinion     Observations/Objective: There were no vitals taken for this visit. BP Readings from Last 3 Encounters:  02/19/19 124/69  11/07/18 115/70  06/24/18 124/78   Exam: Normal Speech.  NAD  Lab and Radiology Results No results found for this or any previous visit (from the past 72 hour(s)). No results found.     Assessment and Plan: 38 y.o. male with The encounter diagnosis was Attention deficit disorder (ADD) in adult.  Trial alternative Amphetamine ER Pt advsised if still $$$ have pharmacist run [employer], call insurance for formulary   PDMP not reviewed this encounter. No orders of the defined types were placed in this encounter.  Meds ordered this encounter  Medications  . Amphetamine ER 3.1 MG TBED    Sig: Take 3.1 mg by mouth daily.    Dispense:  30 tablet    Refill:  0   There are no Patient Instructions on file for this visit.    Follow Up Instructions: Return in about 3 months (around 09/04/2019) for ADHD follow-up (virtual ok), sooner if needed .    I discussed the assessment and treatment plan with the patient. The patient was provided an opportunity to ask questions and all were answered. The patient agreed with the plan and demonstrated an understanding of the instructions.   The patient  was advised to call back or seek an in-person evaluation if any new concerns, if symptoms worsen or if the condition fails to improve as anticipated.  15 minutes of non-face-to-face time was provided during this encounter.                      Historical information moved to improve visibility of documentation.  Past Medical History:  Diagnosis Date  . Allergic rhinitis   . Chronic back pain   . Dyslipidemia   . Dyspepsia   . GERD (gastroesophageal reflux disease)   . Groin pain, right lower quadrant   . Hemorrhoids    internal, per patient  . IBS (irritable bowel syndrome)   . Sebaceous cyst   . Vertigo    Past Surgical History:  Procedure Laterality Date  . COLONOSCOPY  2012   In [place] he believes but don't know where. Was told that he had internal hemorrhoids  . ESOPHAGOGASTRODUODENOSCOPY  2012   believes it was done same time with colonoscopy  . HYDROCELE EXCISION / REPAIR    . MOUTH SURGERY    . SPERMATIC VEIN LIGATION     Social History   Tobacco Use  . Smoking status: Never Smoker  . Smokeless tobacco: Never Used  Substance Use Topics  . Alcohol use: Not Currently    Comment: rarely    family history includes Cancer in his father and maternal grandmother; Heart disease in  his paternal grandfather.  Medications: Current Outpatient Medications  Medication Sig Dispense Refill  . Amphetamine ER (ADZENYS XR-ODT) 3.1 MG TBED Take 3.1-6.2 mg by mouth daily. 60 tablet 0  . ketoconazole (NIZORAL) 2 % cream as needed.     . SOOLANTRA 1 % CREA as needed.     Marland Kitchen. atomoxetine (STRATTERA) 40 MG capsule Take 1 capsule (40 mg total) by mouth daily. Can increase to 2 capsule (80 mg total) daily after 2 weeks. (Patient not taking: Reported on 06/04/2019) 60 capsule 1   No current facility-administered medications for this visit.    Allergies  Allergen Reactions  . Abilify [Aripiprazole]     Dizziness Patient said that he said he is not allergic to that  medication     PDMP not reviewed this encounter. No orders of the defined types were placed in this encounter.  No orders of the defined types were placed in this encounter.

## 2019-06-26 ENCOUNTER — Ambulatory Visit: Payer: BC Managed Care – PPO | Admitting: Osteopathic Medicine

## 2019-06-26 ENCOUNTER — Other Ambulatory Visit: Payer: Self-pay

## 2019-06-26 ENCOUNTER — Encounter: Payer: Self-pay | Admitting: Osteopathic Medicine

## 2019-06-26 VITALS — BP 104/66 | HR 101 | Temp 98.1°F | Wt 150.7 lb

## 2019-06-26 DIAGNOSIS — H9202 Otalgia, left ear: Secondary | ICD-10-CM | POA: Diagnosis not present

## 2019-06-26 MED ORDER — NAPROXEN 500 MG PO TABS: 500.0000 mg | ORAL_TABLET | Freq: Two times a day (BID) | ORAL | 1 refills | Status: DC

## 2019-06-26 MED ORDER — PREDNISONE 20 MG PO TABS: 20.0000 mg | ORAL_TABLET | Freq: Two times a day (BID) | ORAL | 0 refills | Status: DC

## 2019-06-26 NOTE — Patient Instructions (Signed)
Plan:  There does not really appear to be anything consistent with a true infection.  Antibiotics definitely should have worked for ear infection, sinus infection, dental infection, or pretty much any infection like that in the upper respiratory tract.  I am more suspicious of an unusual presentation of temporomandibular joint pain aka TMJ syndrome.  Let us try to get this to calm down with some prescription strength anti-inflammatories and a burst of steroids.  If symptoms change or get worse despite treatment, or if it does not seem to be getting better, please let me know and we will send you to an ENT specialist!

## 2019-06-26 NOTE — Progress Notes (Signed)
HPI: Larry Mercado is a 38 y.o. male who  has a past medical history of Allergic rhinitis, Chronic back pain, Dyslipidemia, Dyspepsia, GERD (gastroesophageal reflux disease), Groin pain, right lower quadrant, Hemorrhoids, IBS (irritable bowel syndrome), Sebaceous cyst, and Vertigo.  he presents to [facility] today, 06/26/19,  for chief complaint of:  Ear problem  Amoxicillin 7-day Rx given via e-visit for pain in L ear, no records available. L ear pain persists despite finishing course abx. Worse in AM and PM. Points to TMJ on L side as area where pain is worse, describes as sharp      At today's visit 06/26/19 ... PMH, PSH, FH reviewed and updated as needed.  Current medication list and allergy/intolerance hx reviewed and updated as needed. (See remainder of HPI, ROS, Phys Exam below)   No results found.  No results found for this or any previous visit (from the past 72 hour(s)).        ASSESSMENT/PLAN: The encounter diagnosis was Left ear pain.   Meds ordered this encounter  Medications  . predniSONE (DELTASONE) 20 MG tablet    Sig: Take 1 tablet (20 mg total) by mouth 2 (two) times daily with a meal.    Dispense:  10 tablet    Refill:  0  . DISCONTD: naproxen (NAPROSYN) 500 MG tablet    Sig: Take 1 tablet (500 mg total) by mouth 2 (two) times daily with a meal. Take every day for one week, then use as needed after that for aches/pains    Dispense:  60 tablet    Refill:  1  . naproxen (NAPROSYN) 500 MG tablet    Sig: Take 1 tablet (500 mg total) by mouth 2 (two) times daily with a meal. Take every day for one week, then use as needed for aches/pains    Dispense:  60 tablet    Refill:  1    Patient Instructions  Plan:  There does not really appear to be anything consistent with a true infection.  Antibiotics definitely should have worked for ear infection, sinus infection, dental infection, or pretty much any infection like that  in the upper respiratory tract.  I am more suspicious of an unusual presentation of temporomandibular joint pain aka TMJ syndrome.  Let us try to get this to calm down with some prescription strength anti-inflammatories and a burst of steroids.  If symptoms change or get worse despite treatment, or if it does not seem to be getting better, please let me know and we will send you to an ENT specialist!        Follow-up plan: Return if symptoms worsen or fail to improve.                                                 ################################################# ################################################# ################################################# #################################################    No outpatient medications have been marked as taking for the 06/26/19 encounter (Appointment) with Sunnie NielsenAlexander, Lathaniel Legate, DO.    Allergies  Allergen Reactions  . Abilify [Aripiprazole]     Dizziness Patient said that he said he is not allergic to that medication        Review of Systems:  Constitutional: No recent illness  HEENT: No  headache, no vision change, no sinus pressure or sore throat, see HPI  Musculoskeletal: No new myalgia/arthralgia  Skin: No  Rash  Neurologic: No  weakness, No  Dizziness   Exam:  BP 104/66 (BP Location: Left Arm, Patient Position: Sitting, Cuff Size: Normal)   Pulse (!) 101   Temp 98.1 F (36.7 C) (Oral)   Wt 150 lb 11.2 oz (68.4 kg)   BMI 20.58 kg/m   Constitutional: VS see above. General Appearance: alert, well-developed, well-nourished, NAD  Eyes: Normal lids and conjunctive, non-icteric sclera  Ears, Nose, Mouth, Throat: MMM, Normal external inspection ears/nares/mouth/lips/gums. TM normal bilaterally  Neck: No masses, trachea midline.   Respiratory: Normal respiratory effort.  Musculoskeletal: Gait normal. No tenderness to L TMJ, normal mandibular ROM    Neurological: Normal balance/coordination. No tremor.  Skin: warm, dry, intact.   Psychiatric: Normal judgment/insight. Normal mood and affect. Oriented x3.       Visit summary with medication list and pertinent instructions was printed for patient to review, patient was advised to alert [place] if any updates are needed. All questions at time of visit were answered - patient instructed to contact office with any additional concerns. ER/RTC precautions were reviewed with the patient and understanding verbalized.    Please note: voice recognition software was used to produce this document, and typos may escape review. Please contact Dr. Sheppard Coil for any needed clarifications.    Follow up plan: Return if symptoms worsen or fail to improve.

## 2019-07-09 ENCOUNTER — Telehealth: Payer: Self-pay

## 2019-07-09 MED ORDER — ADZENYS XR-ODT 3.1 MG PO TBED: 3.1000 mg | EXTENDED_RELEASE_TABLET | Freq: Every day | ORAL | 0 refills | Status: DC

## 2019-07-09 NOTE — Telephone Encounter (Signed)
As per pt, requesting med refill for adzenys. Pt states that there is no big difference in pricing. Pls send to [facility].

## 2019-07-09 NOTE — Telephone Encounter (Signed)
I don't see Xanax / alprazolam on his list ever, can we confirm this is a refill or does he need to discuss new rx / anxiety?

## 2019-07-09 NOTE — Telephone Encounter (Signed)
Pt called requesting med refill for xanax. Pls send rx to [facility].

## 2019-09-23 ENCOUNTER — Telehealth: Payer: Self-pay

## 2019-09-23 DIAGNOSIS — R000 Tachycardia, unspecified: Secondary | ICD-10-CM

## 2019-09-23 NOTE — Telephone Encounter (Signed)
Pt left a vm msg stating he has not heard from Cardiologist referral. As per pt, he was to be set up with a heart monitor. Pt is requesting office contact number to follow up. Pls advise, thanks.

## 2019-09-23 NOTE — Telephone Encounter (Signed)
Cardiology referral 12/2018

## 2019-09-23 NOTE — Telephone Encounter (Signed)
Cindy,  Can you please follow up on this and let pt/provider know an update?  Thanks!

## 2019-09-24 NOTE — Telephone Encounter (Signed)
Referral closed due to expiration and I don't see an order for a heart monitor. - CF

## 2019-09-25 NOTE — Addendum Note (Signed)
Addended by: Maryla Morrow on: 09/25/2019 02:33 PM   Modules accepted: Orders

## 2019-10-08 ENCOUNTER — Ambulatory Visit (INDEPENDENT_AMBULATORY_CARE_PROVIDER_SITE_OTHER): Payer: BC Managed Care – PPO | Admitting: Cardiology

## 2019-10-08 ENCOUNTER — Other Ambulatory Visit: Payer: Self-pay

## 2019-10-08 ENCOUNTER — Ambulatory Visit (INDEPENDENT_AMBULATORY_CARE_PROVIDER_SITE_OTHER): Payer: BC Managed Care – PPO

## 2019-10-08 ENCOUNTER — Encounter: Payer: Self-pay | Admitting: Cardiology

## 2019-10-08 VITALS — BP 120/72 | HR 94 | Ht 71.75 in | Wt 154.0 lb

## 2019-10-08 DIAGNOSIS — R0602 Shortness of breath: Secondary | ICD-10-CM | POA: Diagnosis not present

## 2019-10-08 DIAGNOSIS — R002 Palpitations: Secondary | ICD-10-CM

## 2019-10-08 NOTE — Patient Instructions (Addendum)
Medication Instructions:  Your physician recommends that you continue on your current medications as directed. Please refer to the Current Medication list given to you today.  *If you need a refill on your cardiac medications before your next appointment, please call your pharmacy*  Lab Work: NOne If you have labs (blood work) drawn today and your tests are completely normal, you will receive your results only by: Marland Kitchen MyChart Message (if you have MyChart) OR . A paper copy in the mail If you have any lab test that is abnormal or we need to change your treatment, we will call you to review the results.  Testing/Procedures: Your physician has requested that you have an echocardiogram. Echocardiography is a painless test that uses sound waves to create images of your heart. It provides your doctor with information about the size and shape of your heart and how well your heart's chambers and valves are working. This procedure takes approximately one hour. There are no restrictions for this procedure.  A zio monitor was placed today. It will remain on for 14 days. You will then return monitor and event diary in provided box. It takes 1-2 weeks for report to be downloaded and returned to [place]. We will call you with the results. If monitor falls off or has orange flashing light, please call Zio for further instructions.     Follow-Up: At [facility], you and your health needs are our priority.  As part of our continuing mission to provide you with exceptional heart care, we have created designated Provider Care Teams.  These Care Teams include your primary Cardiologist (physician) and Advanced Practice Providers (APPs -  Physician Assistants and Nurse Practitioners) who all work together to provide you with the care you need, when you need it.  Your next appointment:   3 month(s)  The format for your next appointment:   In Person  Provider:   Berniece Salines, DO  Other Instructions

## 2019-10-08 NOTE — Progress Notes (Signed)
Cardiology Office Note:    Date:  10/08/2019   ID:  Larry Mercado, DOB [birth year], MRN [id number]  PCP:  Sunnie Nielsen, DO  Cardiologist:  Thomasene Ripple, DO  Electrophysiologist:  None   Referring MD: Sunnie Nielsen, DO   Chief Complaint  Patient presents with   Shortness of Breath   Tachycardia   History of Present Illness:    Larry Mercado is a 38 y.o. male with a hx of hyperlipidemia, GERD, adult ADHD presents today to be evaluated for worsening shortness of breath and palpitations.  Patient tells me that over the last several months he has been having increasing palpitations.  He notes that some days are better than others.  He tells me that at times he has this happen onset of fast heartbeat that lasts for minutes to seconds which is associated with a chest tightness, dizziness, and lightheadedness.  He says that nothing bothers him more than shortness of breath that he gets after his palpitations.  They said the shortness of breath this is most bothersome symptoms and this is worsening over time.     Past Medical History:  Diagnosis Date   Allergic rhinitis    Attention deficit    Chronic back pain    Dyslipidemia    Dyspepsia    GERD (gastroesophageal reflux disease)    Groin pain, right lower quadrant    Hemorrhoids    internal, per patient   IBS (irritable bowel syndrome)    Sebaceous cyst    Vertigo     Past Surgical History:  Procedure Laterality Date   COLONOSCOPY  2012   In [place] he believes but don't know where. Was told that he had internal hemorrhoids   ESOPHAGOGASTRODUODENOSCOPY  2012   believes it was done same time with colonoscopy   HYDROCELE EXCISION / REPAIR     MOUTH SURGERY     SPERMATIC VEIN LIGATION      Current Medications: Current Meds  Medication Sig   ketoconazole (NIZORAL) 2 % cream as needed.    naproxen (NAPROSYN) 500 MG tablet Take 1 tablet (500 mg total) by mouth 2 (two) times daily with a meal.  Take every day for one week, then use as needed for aches/pains   SOOLANTRA 1 % CREA as needed.      Allergies:   Abilify [aripiprazole]   Social History   Socioeconomic History   Marital status: Married    Spouse name: Not on file   Number of children: Not on file   Years of education: Not on file   Highest education level: Not on file  Occupational History   Not on file  Social Needs   Financial resource strain: Not on file   Food insecurity    Worry: Not on file    Inability: Not on file   Transportation needs    Medical: Not on file    Non-medical: Not on file  Tobacco Use   Smoking status: Never Smoker   Smokeless tobacco: Never Used  Substance and Sexual Activity   Alcohol use: Not Currently    Comment: rarely    Drug use: No   Sexual activity: Yes  Lifestyle   Physical activity    Days per week: Not on file    Minutes per session: Not on file   Stress: Not on file  Relationships   Social connections    Talks on phone: Not on file    Gets together: Not on file  Attends religious service: Not on file    Active member of club or organization: Not on file    Attends meetings of clubs or organizations: Not on file    Relationship status: Not on file  Other Topics Concern   Not on file  Social History Narrative   Not on file     Family History: The patient's family history includes Alzheimer's disease in his maternal grandmother; Cancer in his father; Heart disease in his paternal grandfather; Mesothelioma in his paternal grandmother; Mitral valve prolapse in his father; Prostate cancer in his maternal grandfather. There is no history of Colon cancer or Esophageal cancer.  ROS:   Review of Systems  Constitution: Negative for decreased appetite, fever and weight gain.  HENT: Negative for congestion, ear discharge, hoarse voice and sore throat.   Eyes: Negative for discharge, redness, vision loss in right eye and visual halos.    Cardiovascular: Negative for chest pain, dyspnea on exertion, leg swelling, orthopnea and palpitations.  Respiratory: Negative for cough, hemoptysis, shortness of breath and snoring.   Endocrine: Negative for heat intolerance and polyphagia.  Hematologic/Lymphatic: Negative for bleeding problem. Does not bruise/bleed easily.  Skin: Negative for flushing, nail changes, rash and suspicious lesions.  Musculoskeletal: Negative for arthritis, joint pain, muscle cramps, myalgias, neck pain and stiffness.  Gastrointestinal: Negative for abdominal pain, bowel incontinence, diarrhea and excessive appetite.  Genitourinary: Negative for decreased libido, genital sores and incomplete emptying.  Neurological: Negative for brief paralysis, focal weakness, headaches and loss of balance.  Psychiatric/Behavioral: Negative for altered mental status, depression and suicidal ideas.  Allergic/Immunologic: Negative for HIV exposure and persistent infections.    EKGs/Labs/Other Studies Reviewed:    The following studies were reviewed today:   EKG:  The ekg ordered today demonstrates sinus rhythm, heart rate 80 bpm.  Holter monitor performed 72 hours on January 18, 2019: Reported unremarkable with a normal limits.  There were no evidence arrhythmias.  Recent Labs: 11/07/2018: Magnesium 1.7; TSH 0.73 02/19/2019: ALT 17; BUN 12; Creat 1.03; Hemoglobin 16.7; Platelets 238; Potassium 4.2; Sodium 140  Recent Lipid Panel    Component Value Date/Time   CHOL 154 11/07/2018 1511   TRIG 101 11/07/2018 1511   HDL 40 (L) 11/07/2018 1511   CHOLHDL 3.9 11/07/2018 1511   VLDL 12 01/02/2017 1651   LDLCALC 95 11/07/2018 1511    Physical Exam:    VS:  BP 120/72 (BP Location: Left Arm, Patient Position: Sitting, Cuff Size: Normal)    Pulse 94    Ht 5' 11.75" (1.822 m)    Wt 154 lb (69.9 kg)    SpO2 99%    BMI 21.03 kg/m     Wt Readings from Last 3 Encounters:  10/08/19 154 lb (69.9 kg)  06/26/19 150 lb 11.2 oz (68.4  kg)  05/07/19 153 lb (69.4 kg)     GEN: Well nourished, well developed in no acute distress HEENT: Normal NECK: No JVD; No carotid bruits LYMPHATICS: No lymphadenopathy CARDIAC: S1S2 noted,RRR, no murmurs, rubs, gallops RESPIRATORY:  Clear to auscultation without rales, wheezing or rhonchi  ABDOMEN: Soft, non-tender, non-distended, +bowel sounds, no guarding. EXTREMITIES: No edema, No cyanosis, no clubbing MUSCULOSKELETAL:  No edema; No deformity  SKIN: Warm and dry NEUROLOGIC:  Alert and oriented x 3, non-focal PSYCHIATRIC:  Normal affect, good insight  ASSESSMENT:    1. Palpitations   2. Shortness of breath    PLAN:    1.  I would like to rule out a cardiovascular  etiology of this palpitation, therefore at this time I would like to placed a zio patch for   14 days. In additon a transthoracic echocardiogram will be ordered to assess LV/RV function and any structural abnormalities. Once these testing have been performed amd reviewed further reccomendations will be made. For now, I do reccomend that the patient goes to the nearest ED if  symptoms recur.  The patient is in agreement with the above plan. The patient left the office in stable condition.  The patient will follow up in 3 months or sooner if needed   Medication Adjustments/Labs and Tests Ordered: Current medicines are reviewed at length with the patient today.  Concerns regarding medicines are outlined above.  Orders Placed This Encounter  Procedures   LONG TERM MONITOR (3-14 DAYS)   EKG 12-Lead   ECHOCARDIOGRAM COMPLETE   No orders of the defined types were placed in this encounter.   Patient Instructions  Medication Instructions:  Your physician recommends that you continue on your current medications as directed. Please refer to the Current Medication list given to you today.  *If you need a refill on your cardiac medications before your next appointment, please call your pharmacy*  Lab Work: NOne If  you have labs (blood work) drawn today and your tests are completely normal, you will receive your results only by:  MyChart Message (if you have MyChart) OR  A paper copy in the mail If you have any lab test that is abnormal or we need to change your treatment, we will call you to review the results.  Testing/Procedures: Your physician has requested that you have an echocardiogram. Echocardiography is a painless test that uses sound waves to create images of your heart. It provides your doctor with information about the size and shape of your heart and how well your hearts chambers and valves are working. This procedure takes approximately one hour. There are no restrictions for this procedure.  A zio monitor was placed today. It will remain on for 14 days. You will then return monitor and event diary in provided box. It takes 1-2 weeks for report to be downloaded and returned to [place]. We will call you with the results. If monitor falls off or has orange flashing light, please call Zio for further instructions.     Follow-Up: At [facility], you and your health needs are our priority.  As part of our continuing mission to provide you with exceptional heart care, we have created designated Provider Care Teams.  These Care Teams include your primary Cardiologist (physician) and Advanced Practice Providers (APPs -  Physician Assistants and Nurse Practitioners) who all work together to provide you with the care you need, when you need it.  Your next appointment:   3 month(s)  The format for your next appointment:   In Person  Provider:   Thomasene Ripple, DO  Other Instructions       Adopting a Healthy Lifestyle.  Know what a healthy weight is for you (roughly BMI <25) and aim to maintain this   Aim for 7+ servings of fruits and vegetables daily   65-80+ fluid ounces of water or unsweet tea for healthy kidneys   Limit to max 1 drink of alcohol per day; avoid smoking/tobacco     Limit animal fats in diet for cholesterol and heart health - choose grass fed whenever available   Avoid highly processed foods, and foods high in saturated/trans fats   Aim for low stress -  take time to unwind and care for your mental health   Aim for 150 min of moderate intensity exercise weekly for heart health, and weights twice weekly for bone health   Aim for 7-9 hours of sleep daily   When it comes to diets, agreement about the perfect plan isnt easy to find, even among the experts. Experts at the [employer] developed an idea known as the Healthy Eating Plate. Just imagine a plate divided into logical, healthy portions.   The emphasis is on diet quality:   Load up on vegetables and fruits - one-half of your plate: Aim for color and variety, and remember that potatoes dont count.   Go for whole grains - one-quarter of your plate: Whole wheat, barley, wheat berries, quinoa, oats, brown rice, and foods made with them. If you want pasta, go with whole wheat pasta.   Protein power - one-quarter of your plate: Fish, chicken, beans, and nuts are all healthy, versatile protein sources. Limit red meat.   The diet, however, does go beyond the plate, offering a few other suggestions.   Use healthy plant oils, such as olive, canola, soy, corn, sunflower and peanut. Check the labels, and avoid partially hydrogenated oil, which have unhealthy trans fats.   If youre thirsty, drink water. Coffee and tea are good in moderation, but skip sugary drinks and limit milk and dairy products to one or two daily servings.   The type of carbohydrate in the diet is more important than the amount. Some sources of carbohydrates, such as vegetables, fruits, whole grains, and beans-are healthier than others.   Finally, stay active  Signed, Berniece Salines, DO  10/08/2019 4:17 PM     [facility]

## 2019-10-11 ENCOUNTER — Other Ambulatory Visit: Payer: Self-pay

## 2019-10-11 ENCOUNTER — Ambulatory Visit (HOSPITAL_BASED_OUTPATIENT_CLINIC_OR_DEPARTMENT_OTHER)
Admission: RE | Admit: 2019-10-11 | Discharge: 2019-10-11 | Disposition: A | Discharge status: Home / Self Care | Payer: BC Managed Care – PPO | Source: Ambulatory Visit | Attending: Cardiology | Admitting: Cardiology

## 2019-10-11 DIAGNOSIS — R002 Palpitations: Secondary | ICD-10-CM

## 2019-10-11 NOTE — Progress Notes (Signed)
  Echocardiogram 2D Echocardiogram has been performed.  Larry Mercado 10/11/2019, 2:50 PM

## 2019-10-14 ENCOUNTER — Telehealth: Payer: Self-pay | Admitting: *Deleted

## 2019-10-14 ENCOUNTER — Encounter: Payer: Self-pay | Admitting: *Deleted

## 2019-10-14 NOTE — Telephone Encounter (Signed)
Telephone call to patient. Left message that echo results were normal and to call with any questions. 

## 2019-10-14 NOTE — Telephone Encounter (Signed)
-----   Message from Berniece Salines, DO sent at 10/12/2019  9:50 PM EST ----- Normal echo. Please forward copy to pcp.

## 2019-12-26 ENCOUNTER — Encounter: Payer: Self-pay | Admitting: Nurse Practitioner

## 2019-12-26 ENCOUNTER — Telehealth (INDEPENDENT_AMBULATORY_CARE_PROVIDER_SITE_OTHER): Payer: BC Managed Care – PPO | Admitting: Nurse Practitioner

## 2019-12-26 VITALS — HR 65 | Temp 98.3°F

## 2019-12-26 DIAGNOSIS — G44209 Tension-type headache, unspecified, not intractable: Secondary | ICD-10-CM | POA: Diagnosis not present

## 2019-12-26 NOTE — Progress Notes (Signed)
Virtual Visit via MyChart Note  I connected with  Larry Mercado on 12/26/19 at  9:30 AM EST by the video enabled telemedicine application, MyChart, and verified that I am speaking with the correct person using two identifiers.   I introduced myself as a Publishing rights manager with the practice. We discussed the limitations of evaluation and management by telemedicine and the availability of in person appointments. The patient expressed understanding and agreed to proceed.  The patient is: At home I am: In the office  Subjective:     CC: Headache  HPI: Larry Mercado is a 39 y.o. y/o male presenting via MyChart today for recurrent headaches that have started approximately 3 weeks ago.  He reports the location of the headache varies, occasionally they will be located on the left side of the head, right side of the head, top of the head, and back of the head.  He states the headaches come and go without any consistency.  He reports that he occasionally wakes up with a headache and sometimes they start during the day.  He describes them as a dull ache with occasional tenderness to the scalp and "itching".  He does not know of anything that makes them worse.  He reports he has tried Tylenol and ibuprofen but he feels like for the most part the headaches go away on their own.  He reports them to be approximately 2/10 on the pain scale.  He reports he gets approximately 6 to 7 hours of sleep every night.  He does have increased stress in the household working from home and 3 children who are home schooled, but he does not feel the stress level is any more than usual.  He has had headaches in the past and a CT in 2013 which was negative for any acute changes.  He does report some allergy and sinus issues but typically not this time of year.  He does have glasses for astigmatism and reports he does not wear them consistently, however, he denies vision problems or straining his eyes.  He does work on a Scientific laboratory technician  day.  He also reports a history of TMJ and noticing that his jaw has felt a little sore lately.   Past medical history, Surgical history, Family history not pertinant except as noted below, Social history, Allergies, and medications have been entered into the medical record, reviewed, and corrections made.   Review of Systems:  General: No fevers fatigue, night sweats, weight loss.   Neuro: No numbness, paresthesias, or change in mental status  HEENT: Occasional sinus pain/pressure.  Skin: No rash or lesions noted to the neck or scalp. He denies sensitivity to light, movement, sound.  Denies nausea, vomiting.  Denies changes in his diet or new medications.  Objective:    General: Speaking clearly in complete sentences without any shortness of breath.  Alert and oriented x3.  Normal judgment. No apparent acute distress.   Impression and Recommendations:    1. Tension-type headache, not intractable, unspecified chronicity pattern Symptoms and presentation most correlate with a tension type headache with neuralgia (nerve irritation) presumably stemming from tension in the upper back, shoulders, and neck.  Symptoms potentially exacerbated by chronic computer work.  Visual straining may also be a component that we cannot rule out at this time. Do not feel that imaging is necessary at this time.  Discussed with patient that if symptoms persist we can consider this route.  Would also consider a trial of low-dose gabapentin. Discussed  with the patient ways to help relieve tension by performing gentle neck stretching exercises, consciously relax the muscles in his back shoulders and neck, consciously relax his jaw, and utilize ice and heat on the neck and shoulders.  He may also use Tylenol and ibuprofen as directed on the packaging for pain. If the symptoms worsen, do not go away, or become more bothersome instructed the patient to contact the office for further discussion.    I discussed the  assessment and treatment plan with the patient. The patient was provided an opportunity to ask questions and all were answered. The patient agreed with the plan and demonstrated an understanding of the instructions.   The patient was advised to call back or seek an in-person evaluation if the symptoms worsen or if the condition fails to improve as anticipated.  I provided 30 minutes of non-face-to-face interaction with this [place] visit.   Return if symptoms worsen or fail to improve.  Orma Render, NP

## 2019-12-26 NOTE — Patient Instructions (Signed)
Tension Headache, Adult A tension headache is pain, pressure, or aching in your head. Tension headaches can last from 30 minutes to several days. Follow these instructions at home: Managing pain  Take over-the-counter and prescription medicines only as told by your doctor.  When you have a headache, lie down in a dark, quiet room.  If told, put ice on your head and neck: ? Put ice in a plastic bag. ? Place a towel between your skin and the bag. ? Leave the ice on for 20 minutes, 2-3 times a day.  If told, put heat on the back of your neck. Do this as often as your doctor tells you to. Use the kind of heat that your doctor recommends, such as a moist heat pack or a heating pad. ? Place a towel between your skin and the heat. ? Leave the heat on for 20-30 minutes. ? Remove the heat if your skin turns bright red. Eating and drinking  Eat meals on a regular schedule.  Watch how much alcohol you drink: ? If you are a woman and are not pregnant, do not drink more than 1 drink a day. ? If you are a man, do not drink more than 2 drinks a day.  Drink enough fluid to keep your pee (urine) pale yellow.  Do not use a lot of caffeine, or stop using caffeine. Lifestyle  Get enough sleep. Get 7-9 hours of sleep each night. Or get the amount of sleep that your doctor tells you to.  At bedtime, remove all electronic devices from your room. Examples of electronic devices are computers, phones, and tablets.  Find ways to lessen your stress. Some things that can lessen stress are: ? Exercise. ? Deep breathing. ? Yoga. ? Music. ? Positive thoughts.  Sit up straight. Do not tighten (tense) your muscles.  Do not use any products that have nicotine or tobacco in them, such as cigarettes and e-cigarettes. If you need help quitting, ask your doctor. General instructions   Keep all follow-up visits as told by your doctor. This is important.  Avoid things that can bring on headaches. Keep a  journal to find out if certain things bring on headaches. For example, write down: ? What you eat and drink. ? How much sleep you get. ? Any change to your diet or medicines. Contact a doctor if:  Your headache does not get better.  Your headache comes back.  You have a headache and sounds, light, or smells bother you.  You feel sick to your stomach (nauseous) or you throw up (vomit).  Your stomach hurts. Get help right away if:  You suddenly get a very bad headache along with any of these: ? A stiff neck. ? Feeling sick to your stomach. ? Throwing up. ? Feeling weak. ? Trouble seeing. ? Feeling short of breath. ? A rash. ? Feeling unusually sleepy. ? Trouble speaking. ? Pain in your eye or ear. ? Trouble walking or balancing. ? Feeling like you will pass out (faint). ? Passing out. Summary  A tension headache is pain, pressure, or aching in your head.  Tension headaches can last from 30 minutes to several days.  Lifestyle changes and medicines may help relieve pain. This information is not intended to replace advice given to you by your health care provider. Make sure you discuss any questions you have with your health care provider. Document Revised: 08/14/2019 Document Reviewed: 01/27/2017 Elsevier Patient Education  [employer].  Occipital Neuralgia  Occipital neuralgia is a type of headache that causes brief episodes of very bad pain in the back of your head. Pain from occipital neuralgia may spread (radiate) to other parts of your head. These headaches may be caused by irritation of the nerves that leave your spinal cord high up in your neck, just below the base of your skull (occipital nerves). Your occipital nerves transmit sensations from the back of your head, the top of your head, and the areas behind your ears. What are the causes? This condition can occur without any known cause (primary headache syndrome). In other cases, this condition is caused  by pressure on or irritation of one of the two occipital nerves. Pressure and irritation may be due to: Muscle spasm in the neck. Neck injury. Wear and tear of the vertebrae in the neck (osteoarthritis). Disease of the disks that separate the vertebrae. Swollen blood vessels that put pressure on the occipital nerves. Infections. Tumors. Diabetes. What are the signs or symptoms? This condition causes brief burning, stabbing, electric, shocking, or shooting pain which can radiate to the top of the head. It can happen on one side or both sides of the head. It can also cause: Pain behind the eye. Pain triggered by neck movement or hair brushing. Scalp tenderness. Aching in the back of the head between episodes of very bad pain. Pain gets worse with exposure to bright lights. How is this diagnosed? There is no test that diagnoses this condition. Your health care provider may diagnose this condition based on a physical exam and your symptoms. Other tests may be done, such as: Imaging studies of the brain and neck (cervical spine), such as an MRI or CT scan. These look for causes of pinched nerves. Applying pressure to the nerves in the neck to try to re-create the pain. Injection of numbing medicine into the occipital nerve areas to see if pain goes away (diagnostic nerve block). How is this treated? Treatment for this condition may begin with simple measures, such as: Rest. Massage. Applying heat or cold on the area. Over-the-counter pain relievers. If these measures do not work, you may need other treatments, including: Medicines, such as: Prescription-strength anti-inflammatory medicines. Muscle relaxants. Anti-seizure medicines, which can relieve pain. Antidepressants, which can relieve pain. Injected medicines, such as medicines that numb the area (local anesthetic) and steroids. Pulsed radiofrequency ablation. This is when wires are implanted to deliver electrical impulses that  block pain signals from the occipital nerve. Surgery to relieve nerve pressure. Physical therapy. Follow these instructions at home: Pain management     Avoid any activities that cause pain. Rest when you have an attack of pain. Try gentle massage to relieve pain. Try a different pillow or sleeping position. If directed, apply heat to the affected area as told by your health care provider. Use the heat source that your health care provider recommends, such as a moist heat pack or a heating pad. Place a towel between your skin and the heat source. Leave the heat on for 20-30 minutes. Remove the heat if your skin turns bright red. This is especially important if you are unable to feel pain, heat, or cold. You may have a greater risk of getting burned. If directed, apply ice to the back of the head and neck area as told by your health care provider. Put ice in a plastic bag. Place a towel between your skin and the bag. Leave the ice on for 20 minutes, 2-3  times per day. General instructions Take over-the-counter and prescription medicines only as told by your health care provider. Avoid things that make your symptoms worse, such as bright lights. Try to stay active. Get regular exercise that does not cause pain. Ask your health care provider to suggest safe exercises for you. Work with a physical therapist to learn stretching exercises you can do at home. Practice good posture. Keep all follow-up visits as told by your health care provider. This is important. Contact a health care provider if: Your medicine is not working. You have new or worsening symptoms. Get help right away if: You have very bad head pain that does not go away. You have a sudden change in vision, balance, or speech. Summary Occipital neuralgia is a type of headache that causes brief episodes of very bad pain in the back of your head. Pain from occipital neuralgia may spread (radiate) to other parts of your  head. Treatment for this condition includes rest, massage, and medicines. This information is not intended to replace advice given to you by your health care provider. Make sure you discuss any questions you have with your health care provider. Document Revised: 10/03/2017 Document Reviewed: 12/22/2016 Elsevier Patient Education  [place].

## 2020-01-23 ENCOUNTER — Other Ambulatory Visit: Payer: Self-pay

## 2020-01-23 ENCOUNTER — Encounter: Payer: Self-pay | Admitting: Cardiology

## 2020-01-23 ENCOUNTER — Ambulatory Visit: Payer: BC Managed Care – PPO | Admitting: Cardiology

## 2020-01-23 VITALS — BP 110/80 | HR 77 | Ht 71.75 in | Wt 155.0 lb

## 2020-01-23 DIAGNOSIS — R002 Palpitations: Secondary | ICD-10-CM | POA: Diagnosis not present

## 2020-01-23 DIAGNOSIS — R0602 Shortness of breath: Secondary | ICD-10-CM | POA: Diagnosis not present

## 2020-01-23 NOTE — Progress Notes (Signed)
Cardiology Office Note:    Date:  01/23/2020   ID:  Larry Mercado, DOB Oct 04, 1981, MRN [id number]  PCP:  Emeterio Reeve, DO  Cardiologist:  Berniece Salines, DO  Electrophysiologist:  None   Referring MD: Emeterio Reeve, DO   Follow-up visit  History of Present Illness:    Larry Mercado is a 39 y.o. male with a hx of hypertension, hyperlipidemia presented on October 08, 2019 to be evaluated for palpitations.  At that time he also complained about chest tightness.  He did wear ZIO monitor and the results have been reported to the patient previously with short symptomatic paroxysmal atrial tachycardia.  His echocardiogram was also done and the results have been reported to him previously which was normal.  He is here today for follow-up visit he tells me that the palpitation has improved but he still is experiencing some of breath on exertion.  Past Medical History:  Diagnosis Date  . Allergic rhinitis   . Attention deficit   . Chronic back pain   . Dyslipidemia   . Dyspepsia   . GERD (gastroesophageal reflux disease)   . Groin pain, right lower quadrant   . Hemorrhoids    internal, per patient  . IBS (irritable bowel syndrome)   . Sebaceous cyst   . Vertigo     Past Surgical History:  Procedure Laterality Date  . COLONOSCOPY  2012   In  he believes but don't know where. Was told that he had internal hemorrhoids  . ESOPHAGOGASTRODUODENOSCOPY  2012   believes it was done same time with colonoscopy  . HYDROCELE EXCISION / REPAIR    . MOUTH SURGERY    . SPERMATIC VEIN LIGATION      Current Medications: Current Meds  Medication Sig  . ketoconazole (NIZORAL) 2 % cream as needed.   . naproxen (NAPROSYN) 500 MG tablet Take 1 tablet (500 mg total) by mouth 2 (two) times daily with a meal. Take every day for one week, then use as needed for aches/pains  . SOOLANTRA 1 % CREA as needed.      Allergies:   Abilify [aripiprazole]   Social History    Socioeconomic History  . Marital status: Married    Spouse name: Not on file  . Number of children: Not on file  . Years of education: Not on file  . Highest education level: Not on file  Occupational History  . Not on file  Tobacco Use  . Smoking status: Never Smoker  . Smokeless tobacco: Never Used  Substance and Sexual Activity  . Alcohol use: Not Currently    Comment: rarely   . Drug use: No  . Sexual activity: Yes  Other Topics Concern  . Not on file  Social History Narrative  . Not on file   Social Determinants of Health   Financial Resource Strain:   . Difficulty of Paying Living Expenses:   Food Insecurity:   . Worried About Charity fundraiser in the Last Year:   . Arboriculturist in the Last Year:   Transportation Needs:   . Film/video editor (Medical):   Marland Kitchen Lack of Transportation (Non-Medical):   Physical Activity:   . Days of Exercise per Week:   . Minutes of Exercise per Session:   Stress:   . Feeling of Stress :   Social Connections:   . Frequency of Communication with Friends and Family:   . Frequency of Social Gatherings with Friends and Family:   .  Attends Religious Services:   . Active Member of Clubs or Organizations:   . Attends Banker Meetings:   Marland Kitchen Marital Status:      Family History: The patient's family history includes Alzheimer's disease in his maternal grandmother; Cancer in his father; Heart disease in his paternal grandfather; Mesothelioma in his paternal grandmother; Mitral valve prolapse in his father; Prostate cancer in his maternal grandfather. There is no history of Colon cancer or Esophageal cancer.  ROS:   Review of Systems  Constitution: Negative for decreased appetite, fever and weight gain.  HENT: Negative for congestion, ear discharge, hoarse voice and sore throat.   Eyes: Negative for discharge, redness, vision loss in right eye and visual halos.  Cardiovascular: Negative for chest pain, dyspnea on  exertion, leg swelling, orthopnea and palpitations.  Respiratory: Negative for cough, hemoptysis, shortness of breath and snoring.   Endocrine: Negative for heat intolerance and polyphagia.  Hematologic/Lymphatic: Negative for bleeding problem. Does not bruise/bleed easily.  Skin: Negative for flushing, nail changes, rash and suspicious lesions.  Musculoskeletal: Negative for arthritis, joint pain, muscle cramps, myalgias, neck pain and stiffness.  Gastrointestinal: Negative for abdominal pain, bowel incontinence, diarrhea and excessive appetite.  Genitourinary: Negative for decreased libido, genital sores and incomplete emptying.  Neurological: Negative for brief paralysis, focal weakness, headaches and loss of balance.  Psychiatric/Behavioral: Negative for altered mental status, depression and suicidal ideas.  Allergic/Immunologic: Negative for HIV exposure and persistent infections.    EKGs/Labs/Other Studies Reviewed:    The following studies were reviewed today:   EKG: None today  TTE impression 10/11/2019 1. Left ventricular ejection fraction, by visual estimation, is 60 to  65%. The left ventricle has normal function. Left ventricular septal wall  thickness was normal. Normal left ventricular posterior wall thickness.  There is no left ventricular  hypertrophy.  2. The left ventricle has no regional wall motion abnormalities.  3. Global right ventricle has normal systolic function.The right  ventricular size is normal. No increase in right ventricular wall  thickness.  4. Left atrial size was normal.  5. Right atrial size was normal.  6. The mitral valve is normal in structure. No evidence of mitral valve  regurgitation. No evidence of mitral stenosis.  7. The tricuspid valve is normal in structure. Tricuspid valve  regurgitation is not demonstrated.  8. The aortic valve is normal in structure. Aortic valve regurgitation is  not visualized. No evidence of aortic  valve sclerosis or stenosis.  9. The pulmonic valve was normal in structure. Pulmonic valve  regurgitation is not visualized.  10. Unable to determine Pulmonary artery systolic pressure, No TR jet  spectral display.   zio monitor  The patient wore the monitor for 14 days 0 hours starting 10/08/2019. Indication: Palpitations The minimum heart rate was 47 bpm, maximum heart rate was 207 bpm, and average heart rate was 71 bpm. Predominant underlying rhythm was Sinus Rhythm.  2 Supraventricular Tachycardia runs occurred, the run with the fastest interval lasting 14 beats with a maximum heart rate of 207 bpm (average 159 bpm); the run with the fastest interval was also the longest.   Premature atrial complexes were rare (<1.0%). Premature Ventricular complexes were rare (<1.0%).  No ventricular tachycardia, no pauses, No AV block and no atrial fibrillation present.  22 patient triggered events and 4 diary notes all associated with sinus rhythm.   Conclusion: This study is remarkable for asymptomatic Paroxysmal atrial tachycardia.  Recent Labs: 02/19/2019: ALT 17; BUN 12; Creat  1.03; Hemoglobin 16.7; Platelets 238; Potassium 4.2; Sodium 140  Recent Lipid Panel    Component Value Date/Time   CHOL 154 11/07/2018 1511   TRIG 101 11/07/2018 1511   HDL 40 (L) 11/07/2018 1511   CHOLHDL 3.9 11/07/2018 1511   VLDL 12 01/02/2017 1651   LDLCALC 95 11/07/2018 1511    Physical Exam:    VS:  BP 110/80 (BP Location: Right Arm, Patient Position: Sitting, Cuff Size: Normal)   Pulse 77   Ht 5' 11.75" (1.822 m)   Wt 155 lb (70.3 kg)   SpO2 98%   BMI 21.17 kg/m     Wt Readings from Last 3 Encounters:  01/23/20 155 lb (70.3 kg)  10/08/19 154 lb (69.9 kg)  06/26/19 150 lb 11.2 oz (68.4 kg)     GEN: Well nourished, well developed in no acute distress HEENT: Normal NECK: No JVD; No carotid bruits LYMPHATICS: No lymphadenopathy CARDIAC: S1S2 noted,RRR, no murmurs, rubs,  gallops RESPIRATORY:  Clear to auscultation without rales, wheezing or rhonchi  ABDOMEN: Soft, non-tender, non-distended, +bowel sounds, no guarding. EXTREMITIES: No edema, No cyanosis, no clubbing MUSCULOSKELETAL:  No deformity  SKIN: Warm and dry NEUROLOGIC:  Alert and oriented x 3, non-focal PSYCHIATRIC:  Normal affect, good insight  ASSESSMENT:    1. Palpitations   2. Shortness of breath    PLAN:    I was able to review his testing results with him today in the office.  He still is experiencing shortness of breath on exertion.  I do believe that may be a pulmonary component to this therefore I am going to refer the patient to pulmonary.  I do not believe we need to do any further cardiovascular work-up at this time.  The patient is in agreement with the above plan. The patient left the office in stable condition.  The patient will follow up in as needed.   Medication Adjustments/Labs and Tests Ordered: Current medicines are reviewed at length with the patient today.  Concerns regarding medicines are outlined above.  No orders of the defined types were placed in this encounter.  No orders of the defined types were placed in this encounter.   There are no Patient Instructions on file for this visit.   Adopting a Healthy Lifestyle.  Know what a healthy weight is for you (roughly BMI <25) and aim to maintain this   Aim for 7+ servings of fruits and vegetables daily   65-80+ fluid ounces of water or unsweet tea for healthy kidneys   Limit to max 1 drink of alcohol per day; avoid smoking/tobacco   Limit animal fats in diet for cholesterol and heart health - choose grass fed whenever available   Avoid highly processed foods, and foods high in saturated/trans fats   Aim for low stress - take time to unwind and care for your mental health   Aim for 150 min of moderate intensity exercise weekly for heart health, and weights twice weekly for bone health   Aim for 7-9 hours of  sleep daily   When it comes to diets, agreement about the perfect plan isnt easy to find, even among the experts. Experts at the [facility] [employer] developed an idea known as the Healthy Eating Plate. Just imagine a plate divided into logical, healthy portions.   The emphasis is on diet quality:   Load up on vegetables and fruits - one-half of your plate: Aim for color and variety, and remember that potatoes dont  count.   Go for whole grains - one-quarter of your plate: Whole wheat, barley, wheat berries, quinoa, oats, brown rice, and foods made with them. If you want pasta, go with whole wheat pasta.   Protein power - one-quarter of your plate: Fish, chicken, beans, and nuts are all healthy, versatile protein sources. Limit red meat.   The diet, however, does go beyond the plate, offering a few other suggestions.   Use healthy plant oils, such as olive, canola, soy, corn, sunflower and peanut. Check the labels, and avoid partially hydrogenated oil, which have unhealthy trans fats.   If youre thirsty, drink water. Coffee and tea are good in moderation, but skip sugary drinks and limit milk and dairy products to one or two daily servings.   The type of carbohydrate in the diet is more important than the amount. Some sources of carbohydrates, such as vegetables, fruits, whole grains, and beans-are healthier than others.   Finally, stay active  Signed, Thomasene Ripple, DO  01/23/2020 4:32 PM    [facility]

## 2020-01-23 NOTE — Patient Instructions (Addendum)
Medication Instructions:  Your physician recommends that you continue on your current medications as directed. Please refer to the Current Medication list given to you today.  *If you need a refill on your cardiac medications before your next appointment, please call your pharmacy*   Lab Work: NONE If you have labs (blood work) drawn today and your tests are completely normal, you will receive your results only by: Marland Kitchen MyChart Message (if you have MyChart) OR . A paper copy in the mail If you have any lab test that is abnormal or we need to change your treatment, we will call you to review the results.   Testing/Procedures: NONE  You have been referred to [employer] [facility]. They will contact you for appointment  Follow-Up: At [facility], you and your health needs are our priority.  As part of our continuing mission to provide you with exceptional heart care, we have created designated Provider Care Teams.  These Care Teams include your primary Cardiologist (physician) and Advanced Practice Providers (APPs -  Physician Assistants and Nurse Practitioners) who all work together to provide you with the care you need, when you need it.  We recommend signing up for the patient portal called "MyChart".  Sign up information is provided on this After Visit Summary.  MyChart is used to connect with patients for Virtual Visits (Telemedicine).  Patients are able to view lab/test results, encounter notes, upcoming appointments, etc.  Non-urgent messages can be sent to your provider as well.   To learn more about what you can do with MyChart, go to [email or web address].    Your next appointment:   As Needed

## 2020-03-10 ENCOUNTER — Institutional Professional Consult (permissible substitution): Payer: BC Managed Care – PPO | Admitting: Internal Medicine

## 2020-03-12 ENCOUNTER — Ambulatory Visit: Payer: BC Managed Care – PPO | Admitting: Internal Medicine

## 2020-03-12 ENCOUNTER — Encounter: Payer: Self-pay | Admitting: Internal Medicine

## 2020-03-12 ENCOUNTER — Ambulatory Visit (INDEPENDENT_AMBULATORY_CARE_PROVIDER_SITE_OTHER): Payer: BC Managed Care – PPO

## 2020-03-12 ENCOUNTER — Other Ambulatory Visit: Payer: Self-pay

## 2020-03-12 DIAGNOSIS — R0609 Other forms of dyspnea: Secondary | ICD-10-CM

## 2020-03-12 DIAGNOSIS — R0600 Dyspnea, unspecified: Secondary | ICD-10-CM | POA: Diagnosis not present

## 2020-03-12 DIAGNOSIS — R0602 Shortness of breath: Secondary | ICD-10-CM | POA: Diagnosis not present

## 2020-03-12 NOTE — Progress Notes (Signed)
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 p moved into older house in [place] shortest episode one minute longest 12 hours once while in doctor's office nl sats and nl exam > rec cards eval c/w  asymptomatic PAT" (perstudy from 11/08/19)   and doe  so referred to pulmonary clinic 03/12/2020 by Dr  Thomasene Ripple     History of Present Illness  03/12/2020  Pulmonary/ 1st office eval/[name]  Chief Complaint  Patient presents with  . Follow-up    pt complains of sob comes all the time  Dyspnea:  Not really aerobic but def does not have reproducible or proportionate doe or chest tightness  - wife has treadmill but he never uses / sob can occur at rest (but never asleep) just as often as with activities random pattern  typically assoc with sense of gen chest tight that worsens with deep breath anteriorly s localization or assoc n or v or dyspepsia/ relation to meals or time of day/ activity. Cough: none Sleep: never wakes him  SABA use: none   No obvious patterns in day to day or daytime variability to sob  or assoc excess/ purulent sputum or mucus plugs or hemoptysis  or chest tightness, subjective wheeze or overt sinus or hb symptoms.   Sleeping  without nocturnal  or early am exacerbation  of respiratory  c/o's or need for noct saba. Also denies any obvious fluctuation of symptoms with weather or environmental changes or other aggravating or alleviating factors except as outlined above   No unusual exposure hx or h/o childhood pna/ asthma or knowledge of premature birth.  Current Allergies, Complete Past Medical History, Past Surgical History, Family History, and Social History were reviewed in [employer] record.  ROS  The following are not active complaints unless bolded Hoarseness, sore throat, dysphagia, dental  problems, itching, sneezing,  nasal congestion or discharge of excess mucus or purulent secretions, ear ache,   fever, chills, sweats, unintended wt loss or wt gain, classically pleuritic or exertional cp,  orthopnea pnd or arm/hand swelling  or leg swelling, presyncope, palpitations, abdominal pain, anorexia, nausea, vomiting, diarrhea  or change in bowel habits or change in bladder habits, change in stools or change in urine, dysuria, hematuria,  rash, arthralgias, visual complaints, headache, numbness, weakness or ataxia or problems with walking or coordination,  change in mood or  memory.           Past Medical History:  Diagnosis Date  . Allergic rhinitis   . Attention deficit   . Chronic back pain   . Dyslipidemia   . Dyspepsia   . GERD (gastroesophageal reflux disease)   . Groin pain, right lower quadrant   . Hemorrhoids    internal, per patient  . IBS (irritable bowel syndrome)   . Sebaceous cyst   . Vertigo     Outpatient Medications Prior to Visit  Medication Sig Dispense Refill  . ketoconazole (NIZORAL) 2 % cream as needed.     . naproxen (NAPROSYN) 500 MG tablet Take 1 tablet (500 mg total) by mouth 2 (two) times daily with a meal. Take every day for one week, then use as needed for aches/pains 60 tablet 1  . SOOLANTRA 1 % CREA as needed.  No facility-administered medications prior to visit.     Objective:     BP 120/70 (BP Location: Left Arm, Cuff Size: Normal)   Pulse 88   Temp 98.5 F (36.9 C) (Temporal)   Ht 5\' 11"  (1.803 m)   Wt 156 lb 3.2 oz (70.9 kg)   SpO2 97% Comment: room air  BMI 21.79 kg/m   SpO2: 97 %(room air)   amb pleasant wm nad    HEENT : pt wearing mask not removed for exam due to covid -19 concerns.    NECK :  without JVD/Nodes/TM/ nl carotid upstrokes bilaterally   LUNGS: no acc muscle use,  Nl contour chest which is clear to A and P bilaterally without cough on insp or exp maneuvers   CV:  RRR  no s3 or murmur or increase  in P2, and no edema   ABD:  soft and nontender with nl inspiratory excursion in the supine position. No bruits or organomegaly appreciated, bowel sounds nl  MS:  Nl gait/ ext warm without deformities, calf tenderness, cyanosis or clubbing No obvious joint restrictions   SKIN: warm and dry without lesions    NEURO:  alert, approp, nl sensorium with  no motor or cerebellar deficits apparent.     CXR PA and Lateral:   03/12/2020 :    I personally reviewed images and  impression as follows:   Somewhat vertical chest s significant cardiopulmonary findings/ radiology read as mild bronchitic changes only         Assessment   Dyspnea on exertion Onset 2014 occurs at rest as well and not proportionate to activity - Echo 10/11/2019  wnl  - Cards w/u c/w asymptomatic PAT per Dr 14/09/2019 on monitor from 11/08/19   Symptoms are markedly disproportionate to objective findings and not clear to what extent this is actually a pulmonary  problem but pt does appear to have difficult to sort out respiratory symptoms of unknown origin for which  DDX  = almost all start with A and  include Adherence, Ace Inhibitors, Acid Reflux, Active Sinus Disease, Alpha 1 Antitripsin deficiency, Anxiety masquerading as Airways dz,  ABPA,  Allergy(esp in young), Aspiration (esp in elderly), Adverse effects of meds,  Active smoking or Vaping, A bunch of PE's/clot burden (a few small clots can't cause this syndrome unless there is already severe underlying pulm or vascular dz with poor reserve),  Anemia or thyroid disorder, plus two Bs  = Bronchiectasis and Beta blocker use..and one C= CHF    ? Allergy/ asthma > suggested perhaps by onset of symptoms when moved to new house  ? Anxiety/deconditioning  > usually at the bottom of this list of usual suspects but should be   higher on this pt's based on H and P and  may interfere with adherence and also interpretation of response or lack thereof to symptom management which can be quite  subjective.  - anecdotally this problem seems to be much more common in family members of health care providers, and both this parents meet this criteria.  Anemia/ thyroid dz >   excluded by PCP   02/19/19   ? Cardiac issue > will need to clarify Dr 02/21/19 thoughts re symptomatic or asymptomatic PAT as being a concern.    rec reconditioning ex then proceed to CPST with spirometry before and after.          Each maintenance medication was reviewed in detail including emphasizing most importantly the difference between maintenance and  prns and under what circumstances the prns are to be triggered using an action plan format where appropriate.  Total time for H and P, chart review, counseling,   and generating customized AVS unique to this office visit / charting = 60 min           Christinia Gully, MD 03/12/2020

## 2020-03-12 NOTE — Patient Instructions (Addendum)
To get the most out of exercise, you need to be continuously aware that you are short of breath, but never out of breath, for 30 minutes daily. As you improve, it will actually be easier for you to do the same amount of exercise  in  30 minutes so always push to the level where you are short of breath.       We will sign you up for a CPST  Mid June 2021

## 2020-03-13 ENCOUNTER — Encounter: Payer: Self-pay | Admitting: Internal Medicine

## 2020-03-13 NOTE — Progress Notes (Signed)
Left detailed msg on machine ok per DPR

## 2020-03-13 NOTE — Assessment & Plan Note (Addendum)
Onset 2014 occurs at rest as well and not proportionate to activity - Echo 10/11/2019  wnl  - Cards w/u c/w asymptomatic PAT per Dr Servando Salina on monitor from 11/08/19   Symptoms are markedly disproportionate to objective findings and not clear to what extent this is actually a pulmonary  problem but pt does appear to have difficult to sort out respiratory symptoms of unknown origin for which  DDX  = almost all start with A and  include Adherence, Ace Inhibitors, Acid Reflux, Active Sinus Disease, Alpha 1 Antitripsin deficiency, Anxiety masquerading as Airways dz,  ABPA,  Allergy(esp in young), Aspiration (esp in elderly), Adverse effects of meds,  Active smoking or Vaping, A bunch of PE's/clot burden (a few small clots can't cause this syndrome unless there is already severe underlying pulm or vascular dz with poor reserve),  Anemia or thyroid disorder, plus two Bs  = Bronchiectasis and Beta blocker use..and one C= CHF    ? Allergy/ asthma > suggested perhaps by onset of symptoms when moved to new house  ? Anxiety/deconditioning  > usually at the bottom of this list of usual suspects but should be   higher on this pt's based on H and P and  may interfere with adherence and also interpretation of response or lack thereof to symptom management which can be quite subjective.  - anecdotally this problem seems to be much more common in family members of health care providers, and both this parents meet this criteria.  Anemia/ thyroid dz >   excluded by PCP   02/19/19   ? Cardiac issue > will need to clarify Dr Mallory Shirk thoughts re symptomatic or asymptomatic PAT as being a concern.    rec reconditioning ex then proceed to CPST with spirometry before and after.          Each maintenance medication was reviewed in detail including emphasizing most importantly the difference between maintenance and prns and under what circumstances the prns are to be triggered using an action plan format where  appropriate.  Total time for H and P, chart review, counseling,   and generating customized AVS unique to this office visit / charting = 60 min

## 2020-06-25 ENCOUNTER — Telehealth: Payer: Self-pay | Admitting: Internal Medicine

## 2020-06-25 NOTE — Telephone Encounter (Signed)
ATC patient letting him know that Dr. Sherene Sires is ok with him switching to Dr. Isaiah Serge. Left detailed message per DPR that as of right now he does not have any openings for this patient to see him and his schedule for October is not open yet. Informed patient of information and told him that he can call the office to see if anyone has canceled but unfortunately we do not have a cancellation list. Nothing further needed at this time.

## 2020-06-25 NOTE — Telephone Encounter (Signed)
Dr Sherene Sires- please advise if okay to switch to Dr Isaiah Serge, thanks

## 2020-06-25 NOTE — Telephone Encounter (Signed)
Fine with me, it looks like I rec cpst next if not improved but it was never done so Dr Isaiah Serge is good choice

## 2020-07-02 ENCOUNTER — Other Ambulatory Visit: Payer: Self-pay

## 2020-07-02 ENCOUNTER — Encounter: Payer: Self-pay | Admitting: Pulmonary Disease

## 2020-07-02 ENCOUNTER — Ambulatory Visit: Payer: BC Managed Care – PPO | Admitting: Pulmonary Disease

## 2020-07-02 VITALS — BP 104/72 | HR 84 | Temp 97.7°F | Ht 71.0 in | Wt 158.0 lb

## 2020-07-02 DIAGNOSIS — R0600 Dyspnea, unspecified: Secondary | ICD-10-CM

## 2020-07-02 DIAGNOSIS — R0609 Other forms of dyspnea: Secondary | ICD-10-CM

## 2020-07-02 MED ORDER — BUDESONIDE-FORMOTEROL FUMARATE 160-4.5 MCG/ACT IN AERO: 2.0000 | INHALATION_SPRAY | Freq: Two times a day (BID) | RESPIRATORY_TRACT | 6 refills | Status: DC

## 2020-07-02 NOTE — Patient Instructions (Signed)
We will check some labs today including CBC with differential, respiratory allergy profile Schedule pulmonary function test Start Symbicort 160.  Use 2 puffs twice daily  Follow-up in 1 to 2 months.

## 2020-07-02 NOTE — Progress Notes (Signed)
Larry Mercado    [id number]    1981-10-17  Primary Care Physician:Alexander, Dorene Grebe, DO  Referring Physician: Sunnie Nielsen, DO [address] [place],  [place] [address]  Chief complaint: Consult for dyspnea  HPI: 39 year old with no significant past medical history except for mild GERD, acne rosacea. Complains of dyspnea for the past several years.  Symptoms are episodic with no clear aggravating factor.  They can occur at rest and with exertion. Describes feelings of chest tightness, inability to take deep breath.  Resolves spontaneously over time.  He has been evaluated bu cardiology for symptoms of palpitations with normal zio patch and echocardiogram.  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  Relevant family history: No significant family history of lung disease.   Outpatient Encounter Medications as of 07/02/2020  Medication Sig  . ketoconazole (NIZORAL) 2 % cream as needed.   . naproxen (NAPROSYN) 500 MG tablet Take 1 tablet (500 mg total) by mouth 2 (two) times daily with a meal. Take every day for one week, then use as needed for aches/pains  . SOOLANTRA 1 % CREA as needed.    No facility-administered encounter medications on file as of 07/02/2020.    Allergies as of 07/02/2020 - Review Complete 07/02/2020  Allergen Reaction Noted  . Abilify [aripiprazole]  02/25/2015    Past Medical History:  Diagnosis Date  . Allergic rhinitis   . Attention deficit   . Chronic back pain   . Dyslipidemia   . Dyspepsia   . GERD (gastroesophageal reflux disease)   . Groin pain, right lower quadrant   . Hemorrhoids    internal, per patient  . IBS (irritable bowel syndrome)   . Sebaceous cyst   . Vertigo     Past Surgical History:  Procedure  Laterality Date  . COLONOSCOPY  2012   In [place] he believes but don't know where. Was told that he had internal hemorrhoids  . ESOPHAGOGASTRODUODENOSCOPY  2012   believes it was done same time with colonoscopy  . HYDROCELE EXCISION / REPAIR    . MOUTH SURGERY    . SPERMATIC VEIN LIGATION      Family History  Problem Relation Age of Onset  . Cancer Father        brain  . Mitral valve prolapse Father   . Heart disease Paternal Grandfather   . Alzheimer's disease Maternal Grandmother   . Mesothelioma Paternal Grandmother   . Prostate cancer Maternal Grandfather   . Colon cancer Neg Hx   . Esophageal cancer Neg Hx     Social History   Socioeconomic History  . Marital status: Married    Spouse name: Not on file  . Number of children: Not on file  . Years of education: Not on file  . Highest education level: Not on file  Occupational History  . Not on file  Tobacco Use  . Smoking status: Never Smoker  . Smokeless tobacco: Never Used  Vaping Use  . Vaping Use: Never used  Substance and Sexual Activity  . Alcohol use: Not Currently    Comment: rarely   . Drug use: No  . Sexual activity: Yes  Other Topics Concern  . Not on file  Social History Narrative  . Not on file  Social Determinants of Health   Financial Resource Strain:   . Difficulty of Paying Living Expenses: Not on file  Food Insecurity:   . Worried About Programme researcher, broadcasting/film/video in the Last Year: Not on file  . Ran Out of Food in the Last Year: Not on file  Transportation Needs:   . Lack of Transportation (Medical): Not on file  . Lack of Transportation (Non-Medical): Not on file  Physical Activity:   . Days of Exercise per Week: Not on file  . Minutes of Exercise per Session: Not on file  Stress:   . Feeling of Stress : Not on file  Social Connections:   . Frequency of Communication with Friends and Family: Not on file  . Frequency of Social Gatherings with Friends and Family: Not on file  .  Attends Religious Services: Not on file  . Active Member of Clubs or Organizations: Not on file  . Attends Banker Meetings: Not on file  . Marital Status: Not on file  Intimate Partner Violence:   . Fear of Current or Ex-Partner: Not on file  . Emotionally Abused: Not on file  . Physically Abused: Not on file  . Sexually Abused: Not on file    Review of systems: Review of Systems  Constitutional: Negative for fever and chills.  HENT: Negative.   Eyes: Negative for blurred vision.  Respiratory: as per HPI  Cardiovascular: Negative for chest pain and palpitations.  Gastrointestinal: Negative for vomiting, diarrhea, blood per rectum. Genitourinary: Negative for dysuria, urgency, frequency and hematuria.  Musculoskeletal: Negative for myalgias, back pain and joint pain.  Skin: Negative for itching and rash.  Neurological: Negative for dizziness, tremors, focal weakness, seizures and loss of consciousness.  Endo/Heme/Allergies: Negative for environmental allergies.  Psychiatric/Behavioral: Negative for depression, suicidal ideas and hallucinations.  All other systems reviewed and are negative.  Physical Exam: Blood pressure 104/72, pulse 84, temperature 97.7 F (36.5 C), temperature source Temporal, height 5\' 11"  (1.803 m), weight 158 lb (71.7 kg), SpO2 98 %. Gen:      No acute distress HEENT:  EOMI, sclera anicteric Neck:     No masses; no thyromegaly Lungs:    Clear to auscultation bilaterally; normal respiratory effort CV:         Regular rate and rhythm; no murmurs Abd:      + bowel sounds; soft, non-tender; no palpable masses, no distension Ext:    No edema; adequate peripheral perfusion Skin:      Warm and dry; no rash Neuro: alert and oriented x 3 Psych: normal mood and affect  Data Reviewed: Imaging: Chest x ray 03/12/20- Mild peribronchial thickening. I have reviewed the images personally   PFTs:  Labs:  Cardiac: Echo 10/11/19 LVEF 60-65%, No LVH.  Normal RVSP. Unable to determine PASP as no TR jet  Assessment:  Dyspnea Unclear etiology of symptoms as they appear atypical. Cadiac work is negative as noted above Possibly reactive airway disease given symptoms of mild allergies.and changes of bronchial airway thickening on CXR.   Will get baseline labs including CBC with diff and IgE.  Schedule PFTs  Trial of symbicort  Consider CPST or HRCT is symptoms are still unexplained or PFTs show restriction, diffusion defect.  Plan/Recommendations: CBC, IgE PFTs Symbicort  14/11/20 MD [facility] 07/02/2020, 3:40 PM  CC: 09/01/2020, DO

## 2020-07-03 ENCOUNTER — Encounter: Payer: Self-pay | Admitting: Pulmonary Disease

## 2020-07-03 LAB — CBC WITH DIFFERENTIAL/PLATELET
Basophils Absolute: 0 10*3/uL (ref 0.0–0.1)
Basophils Relative: 0.6 % (ref 0.0–3.0)
Eosinophils Absolute: 0.2 10*3/uL (ref 0.0–0.7)
Eosinophils Relative: 2.8 % (ref 0.0–5.0)
HCT: 47.5 % (ref 39.0–52.0)
Hemoglobin: 16.2 g/dL (ref 13.0–17.0)
Lymphocytes Relative: 18.1 % (ref 12.0–46.0)
Lymphs Abs: 1.4 10*3/uL (ref 0.7–4.0)
MCHC: 34.2 g/dL (ref 30.0–36.0)
MCV: 89 fl (ref 78.0–100.0)
Monocytes Absolute: 0.6 10*3/uL (ref 0.1–1.0)
Monocytes Relative: 7.4 % (ref 3.0–12.0)
Neutro Abs: 5.3 10*3/uL (ref 1.4–7.7)
Neutrophils Relative %: 71.1 % (ref 43.0–77.0)
Platelets: 215 10*3/uL (ref 150.0–400.0)
RBC: 5.33 Mil/uL (ref 4.22–5.81)
RDW: 13.1 % (ref 11.5–15.5)
WBC: 7.5 10*3/uL (ref 4.0–10.5)

## 2020-07-04 LAB — ALLERGEN PANEL (27) + IGE
Alternaria Alternata IgE: <0.1 kU/L
Aspergillus Fumigatus IgE: <0.1 kU/L
Bahia Grass IgE: <0.1 kU/L
Bermuda Grass IgE: <0.1 kU/L
Cat Dander IgE: <0.1 kU/L
Cedar, Mountain IgE: <0.1 kU/L
Cladosporium Herbarum IgE: <0.1 kU/L
Cocklebur IgE: <0.1 kU/L
Cockroach, American IgE: <0.1 kU/L
Common Silver Birch IgE: <0.1 kU/L
D Farinae IgE: <0.1 kU/L
D Pteronyssinus IgE: <0.1 kU/L
Dog Dander IgE: <0.1 kU/L
Elm, American IgE: <0.1 kU/L
Hickory, White IgE: <0.1 kU/L
IgE (Immunoglobulin E), Serum: 9 IU/mL (ref 6–495)
Johnson Grass IgE: <0.1 kU/L
Kentucky Bluegrass IgE: <0.1 kU/L
Maple/Box Elder IgE: <0.1 kU/L
Mucor Racemosus IgE: <0.1 kU/L
Oak, White IgE: <0.1 kU/L
Penicillium Chrysogen IgE: <0.1 kU/L
Pigweed, Rough IgE: <0.1 kU/L
Plantain, English IgE: <0.1 kU/L
Ragweed, Short IgE: <0.1 kU/L
Setomelanomma Rostrat: <0.1 kU/L
Timothy Grass IgE: <0.1 kU/L
White Mulberry IgE: <0.1 kU/L

## 2020-07-07 ENCOUNTER — Encounter: Payer: Self-pay | Admitting: *Deleted

## 2020-08-21 ENCOUNTER — Ambulatory Visit: Payer: BC Managed Care – PPO | Admitting: Medical-Surgical

## 2020-08-21 ENCOUNTER — Ambulatory Visit (INDEPENDENT_AMBULATORY_CARE_PROVIDER_SITE_OTHER): Payer: BC Managed Care – PPO

## 2020-08-21 ENCOUNTER — Other Ambulatory Visit: Payer: Self-pay

## 2020-08-21 ENCOUNTER — Encounter: Payer: Self-pay | Admitting: Medical-Surgical

## 2020-08-21 VITALS — BP 115/79 | HR 82 | Temp 98.2°F | Ht 71.0 in | Wt 159.1 lb

## 2020-08-21 DIAGNOSIS — M25551 Pain in right hip: Secondary | ICD-10-CM

## 2020-08-21 DIAGNOSIS — S79911A Unspecified injury of right hip, initial encounter: Secondary | ICD-10-CM | POA: Diagnosis not present

## 2020-08-21 DIAGNOSIS — Q7649 Other congenital malformations of spine, not associated with scoliosis: Secondary | ICD-10-CM | POA: Diagnosis not present

## 2020-08-21 DIAGNOSIS — M25851 Other specified joint disorders, right hip: Secondary | ICD-10-CM | POA: Diagnosis not present

## 2020-08-21 DIAGNOSIS — M533 Sacrococcygeal disorders, not elsewhere classified: Secondary | ICD-10-CM | POA: Diagnosis not present

## 2020-08-21 NOTE — Progress Notes (Signed)
Subjective:    CC: right hip pain  HPI: Pleasant 39 year old male presenting with reports of right anterior hip pain after landing hard on the right leg when he misjudged the height of a step. After stepping down, he noted the hip was hurting in the front and towards the groin. The pain is worse when sitting and better when standing or lying down. He has a history of right hip pain that usually causes discomfort on the outside of the hip but never in the front. Denies muscle weakness, gait disturbance, and numbness/tingling. Has not identified any specific movements of the hip joint that cause worsening of the pain. Has tried one dose of ibuprofen but did not notice much relief. Tried heat with a heating pad but this seemed to make the pain worse.   I reviewed the past medical history, family history, social history, surgical history, and allergies today and no changes were needed.  Please see the problem list section below in epic for further details.  Past Medical History: Past Medical History:  Diagnosis Date  . Allergic rhinitis   . Attention deficit   . Chronic back pain   . Dyslipidemia   . Dyspepsia   . GERD (gastroesophageal reflux disease)   . Groin pain, right lower quadrant   . Hemorrhoids    internal, per patient  . IBS (irritable bowel syndrome)   . Sebaceous cyst   . Vertigo    Past Surgical History: Past Surgical History:  Procedure Laterality Date  . COLONOSCOPY  2012   In [place] he believes but don't know where. Was told that he had internal hemorrhoids  . ESOPHAGOGASTRODUODENOSCOPY  2012   believes it was done same time with colonoscopy  . HYDROCELE EXCISION / REPAIR    . MOUTH SURGERY    . SPERMATIC VEIN LIGATION     Social History: Social History   Socioeconomic History  . Marital status: Married    Spouse name: Not on file  . Number of children: Not on file  . Years of education: Not on file  . Highest education level: Not on file  Occupational  History  . Not on file  Tobacco Use  . Smoking status: Never Smoker  . Smokeless tobacco: Never Used  Vaping Use  . Vaping Use: Never used  Substance and Sexual Activity  . Alcohol use: Not Currently    Comment: rarely   . Drug use: No  . Sexual activity: Yes  Other Topics Concern  . Not on file  Social History Narrative  . Not on file   Social Determinants of Health   Financial Resource Strain:   . Difficulty of Paying Living Expenses: Not on file  Food Insecurity:   . Worried About Programme researcher, broadcasting/film/video in the Last Year: Not on file  . Ran Out of Food in the Last Year: Not on file  Transportation Needs:   . Lack of Transportation (Medical): Not on file  . Lack of Transportation (Non-Medical): Not on file  Physical Activity:   . Days of Exercise per Week: Not on file  . Minutes of Exercise per Session: Not on file  Stress:   . Feeling of Stress : Not on file  Social Connections:   . Frequency of Communication with Friends and Family: Not on file  . Frequency of Social Gatherings with Friends and Family: Not on file  . Attends Religious Services: Not on file  . Active Member of Clubs or Organizations: Not on  file  . Attends Banker Meetings: Not on file  . Marital Status: Not on file   Family History: Family History  Problem Relation Age of Onset  . Cancer Father        brain  . Mitral valve prolapse Father   . Heart disease Paternal Grandfather   . Alzheimer's disease Maternal Grandmother   . Mesothelioma Paternal Grandmother   . Prostate cancer Maternal Grandfather   . Colon cancer Neg Hx   . Esophageal cancer Neg Hx    Allergies: Allergies  Allergen Reactions  . Abilify [Aripiprazole]     Dizziness Patient said that he said he is not allergic to that medication    Medications: See med rec.  Review of Systems: See HPI for pertinent positives and negatives.   Objective:    General: Well Developed, well nourished, and in no acute distress.   Neuro: Alert and oriented x3.  HEENT: Normocephalic, atraumatic.  Skin: Warm and dry. Cardiac: Regular rate and rhythm, no murmurs rubs or gallops, no lower extremity edema.  Respiratory: Clear to auscultation bilaterally. Not using accessory muscles, speaking in full sentences. Right leg: Muscle strength 5/5 with intact sensation. Full ROM with flexion and extension. Normal external rotation and abduction. Pain reproduced with internal rotation and adduction of the hip.   Impression and Recommendations:    1. Right hip pain Consider possible muscle tear, osteoarthritis, or bursitis. Right hip x-rays today. Recommend Ibuprofen 600mg  every 6 hours scheduled for 1 week and then as needed after. Hip pain exercises handout provided. Consider using cold compresses several times daily to see if this is helpful. If no improvement in 2 weeks, would benefit from evaluation with Dr. .   Return if symptoms worsen or fail to improve. ___________________________________________ Karie Schwalbe, DNP, APRN, FNP-BC Primary Care and Sports Medicine [facility]

## 2020-08-21 NOTE — Patient Instructions (Addendum)
Cold compresses for 15 minutes four times daily.   Ibuprofen 600mg  every 6 hours for 7 days then every 6 hours as needed.   Hip exercises handout!!!  Follow up with Dr. if no improvement in 2 weeks

## 2020-08-24 ENCOUNTER — Telehealth: Payer: Self-pay

## 2020-08-24 NOTE — Telephone Encounter (Signed)
Pt called requesting a med refill for ketoconazole cream 2 %. Written by historical provider.

## 2020-08-25 MED ORDER — KETOCONAZOLE 2 % EX CREA: TOPICAL_CREAM | Freq: Two times a day (BID) | CUTANEOUS | 1 refills | Status: DC | PRN

## 2020-08-31 ENCOUNTER — Encounter: Payer: Self-pay | Admitting: Pharmacist

## 2020-08-31 ENCOUNTER — Other Ambulatory Visit: Payer: Self-pay | Admitting: Pharmacist

## 2020-08-31 ENCOUNTER — Other Ambulatory Visit: Payer: Self-pay

## 2020-08-31 ENCOUNTER — Encounter: Payer: Self-pay | Admitting: Pulmonary Disease

## 2020-08-31 ENCOUNTER — Ambulatory Visit: Payer: BC Managed Care – PPO | Admitting: Pulmonary Disease

## 2020-08-31 VITALS — BP 120/70 | HR 80 | Temp 98.5°F | Ht 71.5 in | Wt 162.0 lb

## 2020-08-31 DIAGNOSIS — J849 Interstitial pulmonary disease, unspecified: Secondary | ICD-10-CM | POA: Diagnosis not present

## 2020-08-31 DIAGNOSIS — Z23 Encounter for immunization: Secondary | ICD-10-CM | POA: Diagnosis not present

## 2020-08-31 MED ORDER — ALBUTEROL SULFATE HFA 108 (90 BASE) MCG/ACT IN AERS: 2.0000 | INHALATION_SPRAY | Freq: Four times a day (QID) | RESPIRATORY_TRACT | 6 refills | Status: DC | PRN

## 2020-08-31 MED ORDER — BUDESONIDE-FORMOTEROL FUMARATE 160-4.5 MCG/ACT IN AERO: 2.0000 | INHALATION_SPRAY | Freq: Two times a day (BID) | RESPIRATORY_TRACT | 6 refills | Status: DC

## 2020-08-31 NOTE — Telephone Encounter (Cosign Needed)
Called patient's pharmacy ([facility]) after visit with Dr. Isaiah Serge today. Patient stated insurance was no longer covering. Looks like insurance is requiring 90 day supply (3 inhalers). Will send a new prescription to pharmacy and MyChart message to Mr. Wilhide.  Chesley Mires, PharmD, MPH Clinical Pharmacist (Rheumatology and Pulmonology)

## 2020-08-31 NOTE — Patient Instructions (Signed)
I am glad you are doing better with your breathing We will need to check with your insurance company about the covered inhalers if Symbicort is denied We will reschedule PFTs  Follow-up in 3 months.

## 2020-08-31 NOTE — Progress Notes (Signed)
         Larry Mercado    [id number]    07-07-1981  Primary Care Physician:Alexander, Dorene Grebe, DO  Referring Physician: Sunnie Nielsen, DO [address] [place],  [place] [address]  Chief complaint: Follow-up for asthma  HPI: 39 year old with no significant past medical history except for mild GERD, acne rosacea. Complains of dyspnea for the past several years.  Symptoms are episodic with no clear aggravating factor.  They can occur at rest and with exertion. Describes feelings of chest tightness, inability to take deep breath.  Resolves spontaneously over time.  He has been evaluated bu cardiology for symptoms of palpitations with normal zio patch and echocardiogram.  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  Relevant family history: No significant family history of lung disease.   Interim history: Started on Symbicort at last visit.  States that he is doing well with this with 80 to 90% improvement in symptoms.  Outpatient Encounter Medications as of 08/31/2020  Medication Sig  . budesonide-formoterol (SYMBICORT) 160-4.5 MCG/ACT inhaler Inhale 2 puffs into the lungs in the morning and at bedtime.  Marland Kitchen ketoconazole (NIZORAL) 2 % cream Apply topically 2 (two) times daily as needed. To affected area   No facility-administered encounter medications on file as of 08/31/2020.    Allergies as of 08/31/2020 - Review Complete 08/31/2020  Allergen Reaction Noted  . Abilify [aripiprazole]  02/25/2015     Physical Exam: Blood pressure 120/70, pulse 80, temperature 98.5 F (36.9 C), temperature source Temporal, height 5' 11.5" (1.816 m), weight 162 lb (73.5 kg), SpO2 100 %. Gen:      No acute distress HEENT:  EOMI, sclera anicteric Neck:     No masses; no  thyromegaly Lungs:    Clear to auscultation bilaterally; normal respiratory effort CV:         Regular rate and rhythm; no murmurs Abd:      + bowel sounds; soft, non-tender; no palpable masses, no distension Ext:    No edema; adequate peripheral perfusion Skin:      Warm and dry; no rash Neuro: alert and oriented x 3 Psych: normal mood and affect  Data Reviewed: Imaging: Chest x ray 03/12/20- Mild peribronchial thickening. I have reviewed the images personally   PFTs:  Labs: CBC 07/02/2020-WBC 7.5, eos 2.8%, absolute eosinophil count 210 RAST panel 07/02/2020-IgE 9, no allergies  Cardiac: Echo 10/11/19 LVEF 60-65%, No LVH. Normal RVSP. Unable to determine PASP as no TR jet  Assessment:  Asthma Cadiac work is negative as noted above Likely has asthma, reactive airway disease given symptoms of mild allergies.and changes of bronchial airway thickening on CXR.  And had good response to Symbicort  Need to check with insurance company about formulary as apparently is not covered PFTs were ordered at last visit but not scheduled yet.  We will need to reorder them.  Plan/Recommendations: Continue Symbicort, check insurance formulary Schedule PFTs  Chilton Greathouse MD [facility] 08/31/2020, 9:43 AM  CC: Sunnie Nielsen, DO

## 2020-09-11 ENCOUNTER — Telehealth: Payer: Self-pay | Admitting: Pulmonary Disease

## 2020-09-11 NOTE — Telephone Encounter (Signed)
PA request received from [facility]  Drug requested: Symbicort 160 CMM Key: [id number] Tried/failed: on file Covered alternatives: none provided on [facility] PA request has been sent to plan, and a determination is expected within 3-5 days.   Routing to [facility] for follow-up.

## 2020-09-15 ENCOUNTER — Telehealth (INDEPENDENT_AMBULATORY_CARE_PROVIDER_SITE_OTHER): Payer: BC Managed Care – PPO | Admitting: Nurse Practitioner

## 2020-09-15 ENCOUNTER — Encounter: Payer: Self-pay | Admitting: Nurse Practitioner

## 2020-09-15 DIAGNOSIS — M5481 Occipital neuralgia: Secondary | ICD-10-CM | POA: Diagnosis not present

## 2020-09-15 MED ORDER — GABAPENTIN 100 MG PO CAPS: 300.0000 mg | ORAL_CAPSULE | Freq: Three times a day (TID) | ORAL | 3 refills | Status: DC

## 2020-09-15 NOTE — Progress Notes (Signed)
Virtual Video Visit via MyChart Note  I connected with  Larry Mercado on 09/15/20 at  1:10 PM EST by the video enabled telemedicine application for , MyChart, and verified that I am speaking with the correct person using two identifiers.   I introduced myself as a Publishing rights manager with the practice. We discussed the limitations of evaluation and management by telemedicine and the availability of in person appointments. The patient expressed understanding and agreed to proceed.  The patient is: at home I am: in the office  Subjective:    CC:  Chief Complaint  Patient presents with  . Headache   No vitals available for today's visit.  HPI: Larry Mercado is a pleasant 39 y.o. y/o male presenting via MyChart today to discuss his ongoing headaches. He was last seen for this by myself in February of this year. At that time he reported onset of headaches for about 3 weeks with very mild symptoms and pain. He reported there was no consistency of the headache location in his head, time of day of occurrence, or associated contributing factors to the headache symptoms. He reported 2/10 on the pain scale with a dull aching sensation, "itching" type sensation, and tenderness to the scalp in the location of the headache. He reported 6-7 hours of sleep and some increased stress from working from home and his 3 children home-schooled. He had a history of intermittent headaches and a negative head CT in 2013. He reported wearing glasses, but no vision changes or problems with vision. He also reported a history of TMJ with mild tenderness on the jaw at that time.   He reports since our last visit the headaches have continued as stated above. They do not occur every day and they are still random in their timing and location. He does report that he has noticed more recently that when the headaches occur in the right temple area he also experiences pressure in his ear and a sensation of pressure under his eye. He  tells me the area around the eye will feel swollen and he does have some increased lacrimation on that side. He states that it feels like his sinus on the one side may be affected, but the pressure sensation is not relieved by sinus medications and there are no other associated symptoms of sinus issues.   He does endorse frequent computer work and a history of a slipped disc in L5/S1 but no other spinal abnormalities that he is aware of. He denies injury to the head or neck. He does endorse a history of vertigo and does experience mild symptoms from time to time, but reports that these episodes are independent of the headache.   He denies fevers, chills, vision changes, weakness, numbness, tingling, difficulty speaking, difficulty swallowing, coordination difficulties, memory changes, "thunderclap" style headaches, aura, neck pain, difficulty moving the neck, increased pain with neck movement, rash, and the headaches do not seem to be intensifying in pain level.   He does report that his father did have a brain tumor, which does make him a little more aware of the headaches and raises a level of concern in the back of his mind that he could have something like this. He tells me his father's symptoms were completely different from what he is experiencing.   He has tried neck exercises and ibuprofen, but he is not sure the ibuprofen helps with the symptoms or the pain resolves on it's own.     Past medical history, Surgical  history, Family history not pertinant except as noted below, Social history, Allergies, and medications have been entered into the medical record, reviewed, and corrections made.   Review of Systems:  See HPI for pertinent positive and negatives  Objective:    General: Speaking clearly in complete sentences.  No swallowing abnormalities noted.  No shortness of breath.   No nystagmus noted horizontally or vertically.  Eyes are aligned with normal pupillary response.  Eye  movement is smooth and purposeful.  No lid lag noted.  No edema to the face.  No cranial nerve deficit with mouth, tongue, face, eye, or forehead movement.  No sensation changes. Alert and oriented x3.   Normal judgment.  No apparent acute distress.  Impression and Recommendations:    1. Bilateral occipital neuralgia Symptoms and presentation consistent with neuropathic type pain of the cranium. No neurological deficits noted on video exam today.  Given the lack of severe pain and consistency of vertigo and ear symptoms with headache, vestibular migraine is low on the differential list.  Pt advised that this type of pain could be from skeletal problem or muscular tension and tightness in the neck and treatment aims to help stop the offending cause and relieve the pain.  Recommend a trial of gabapentin 300 mg nightly for at least 2 weeks with the option to take up to an additional 300 mg twice during the day as needed for pain and headache.  Recommend neck exercises three times a day with heat and ice applied to the neck for 20 minutes at least twice a day.  Discussed the option of imaging with the patient given his father's history. As he is not currently having any associated symptoms, a joint decision was made for conservative treatment measures.  If treatment is unsuccessful, will consider further imaging of the head and neck to rule out abnormality or structural etiology. Follow-up in 2 weeks to let me know how symptoms are responding to medication or sooner, if needed.  - gabapentin (NEURONTIN) 100 MG capsule; Take 3 capsules (300 mg total) by mouth 3 (three) times daily.  Dispense: 68 capsule; Refill: 3      I discussed the assessment and treatment plan with the patient. The patient was provided an opportunity to ask questions and all were answered. The patient agreed with the plan and demonstrated an understanding of the instructions.   The patient was advised to call back or seek  an in-person evaluation if the symptoms worsen or if the condition fails to improve as anticipated.  I provided 30 minutes of non-face-to-face interaction with this MYCHART visit including intake, same-day documentation, and chart review.   Tollie Eth, NP

## 2020-09-15 NOTE — Patient Instructions (Signed)
I will email you the neck exercises that may help. I also recommend heat and ice to the neck at least twice a day for about 20 minutes. You can do this more if it is helpful with symptoms.    Occipital Neuralgia  Occipital neuralgia is a type of headache that causes brief episodes of very bad pain in the back of your head. Pain from occipital neuralgia may spread (radiate) to other parts of your head. These headaches may be caused by irritation of the nerves that leave your spinal cord high up in your neck, just below the base of your skull (occipital nerves). Your occipital nerves transmit sensations from the back of your head, the top of your head, and the areas behind your ears. What are the causes? This condition can occur without any known cause (primary headache syndrome). In other cases, this condition is caused by pressure on or irritation of one of the two occipital nerves. Pressure and irritation may be due to:  Muscle spasm in the neck.  Neck injury.  Wear and tear of the vertebrae in the neck (osteoarthritis).  Disease of the disks that separate the vertebrae.  Swollen blood vessels that put pressure on the occipital nerves.  Infections.  Tumors.  Diabetes. What are the signs or symptoms? This condition causes brief burning, stabbing, electric, shocking, or shooting pain which can radiate to the top of the head. It can happen on one side or both sides of the head. It can also cause:  Pain behind the eye.  Pain triggered by neck movement or hair brushing.  Scalp tenderness.  Aching in the back of the head between episodes of very bad pain.  Pain gets worse with exposure to bright lights. How is this diagnosed? There is no test that diagnoses this condition. Your health care provider may diagnose this condition based on a physical exam and your symptoms. Other tests may be done, such as:  Imaging studies of the brain and neck (cervical spine), such as an MRI or CT  scan. These look for causes of pinched nerves.  Applying pressure to the nerves in the neck to try to re-create the pain.  Injection of numbing medicine into the occipital nerve areas to see if pain goes away (diagnostic nerve block). How is this treated? Treatment for this condition may begin with simple measures, such as:  Rest.  Massage.  Applying heat or cold on the area.  Over-the-counter pain relievers. If these measures do not work, you may need other treatments, including:  Medicines, such as: ? Prescription-strength anti-inflammatory medicines. ? Muscle relaxants. ? Anti-seizure medicines, which can relieve pain. ? Antidepressants, which can relieve pain. ? Injected medicines, such as medicines that numb the area (local anesthetic) and steroids.  Pulsed radiofrequency ablation. This is when wires are implanted to deliver electrical impulses that block pain signals from the occipital nerve.  Surgery to relieve nerve pressure.  Physical therapy. Follow these instructions at home: Pain management      Avoid any activities that cause pain.  Rest when you have an attack of pain.  Try gentle massage to relieve pain.  Try a different pillow or sleeping position.  If directed, apply heat to the affected area as told by your health care provider. Use the heat source that your health care provider recommends, such as a moist heat pack or a heating pad. ? Place a towel between your skin and the heat source. ? Leave the heat on for  20-30 minutes. ? Remove the heat if your skin turns bright red. This is especially important if you are unable to feel pain, heat, or cold. You may have a greater risk of getting burned.  If directed, apply ice to the back of the head and neck area as told by your health care provider. ? Put ice in a plastic bag. ? Place a towel between your skin and the bag. ? Leave the ice on for 20 minutes, 2-3 times per day. General instructions  Take  over-the-counter and prescription medicines only as told by your health care provider.  Avoid things that make your symptoms worse, such as bright lights.  Try to stay active. Get regular exercise that does not cause pain. Ask your health care provider to suggest safe exercises for you.  Work with a physical therapist to learn stretching exercises you can do at home.  Practice good posture.  Keep all follow-up visits as told by your health care provider. This is important. Contact a health care provider if:  Your medicine is not working.  You have new or worsening symptoms. Get help right away if:  You have very bad head pain that does not go away.  You have a sudden change in vision, balance, or speech. Summary  Occipital neuralgia is a type of headache that causes brief episodes of very bad pain in the back of your head.  Pain from occipital neuralgia may spread (radiate) to other parts of your head.  Treatment for this condition includes rest, massage, and medicines. This information is not intended to replace advice given to you by your health care provider. Make sure you discuss any questions you have with your health care provider. Document Revised: 10/03/2017 Document Reviewed: 12/22/2016 Elsevier Patient Education  2020 [employer].

## 2020-09-16 NOTE — Telephone Encounter (Signed)
Per [facility]-  Information regarding your request  Your PA has been resolved, no additional PA is required. For further inquiries please contact the number on the back of the member prescription card. (Message [phone])

## 2020-09-30 ENCOUNTER — Encounter: Payer: Self-pay | Admitting: Nurse Practitioner

## 2020-10-01 ENCOUNTER — Other Ambulatory Visit: Payer: Self-pay | Admitting: Nurse Practitioner

## 2020-10-01 DIAGNOSIS — M5481 Occipital neuralgia: Secondary | ICD-10-CM

## 2020-10-07 ENCOUNTER — Encounter: Payer: Self-pay | Admitting: Medical-Surgical

## 2020-10-07 ENCOUNTER — Telehealth (INDEPENDENT_AMBULATORY_CARE_PROVIDER_SITE_OTHER): Payer: BC Managed Care – PPO | Admitting: Medical-Surgical

## 2020-10-07 ENCOUNTER — Encounter (HOSPITAL_BASED_OUTPATIENT_CLINIC_OR_DEPARTMENT_OTHER): Payer: Self-pay

## 2020-10-07 ENCOUNTER — Other Ambulatory Visit: Payer: Self-pay

## 2020-10-07 ENCOUNTER — Ambulatory Visit (HOSPITAL_BASED_OUTPATIENT_CLINIC_OR_DEPARTMENT_OTHER)
Admission: RE | Admit: 2020-10-07 | Discharge: 2020-10-07 | Disposition: A | Discharge status: Home / Self Care | Payer: BC Managed Care – PPO | Source: Ambulatory Visit | Attending: Nurse Practitioner | Admitting: Nurse Practitioner

## 2020-10-07 ENCOUNTER — Ambulatory Visit (INDEPENDENT_AMBULATORY_CARE_PROVIDER_SITE_OTHER): Payer: BC Managed Care – PPO

## 2020-10-07 VITALS — Temp 98.2°F

## 2020-10-07 DIAGNOSIS — M5481 Occipital neuralgia: Secondary | ICD-10-CM

## 2020-10-07 DIAGNOSIS — R0602 Shortness of breath: Secondary | ICD-10-CM

## 2020-10-07 DIAGNOSIS — R079 Chest pain, unspecified: Secondary | ICD-10-CM | POA: Diagnosis not present

## 2020-10-07 DIAGNOSIS — R071 Chest pain on breathing: Secondary | ICD-10-CM

## 2020-10-07 DIAGNOSIS — R918 Other nonspecific abnormal finding of lung field: Secondary | ICD-10-CM | POA: Diagnosis not present

## 2020-10-07 DIAGNOSIS — R059 Cough, unspecified: Secondary | ICD-10-CM | POA: Diagnosis not present

## 2020-10-07 NOTE — Progress Notes (Signed)
Virtual Visit via Video Note  I connected with Larry Mercado on 10/07/20 at  2:00 PM EST by a video enabled telemedicine application and verified that I am speaking with the correct person using two identifiers.   I discussed the limitations of evaluation and management by telemedicine and the availability of in person appointments. The patient expressed understanding and agreed to proceed.  Patient location: home Provider locations: office  Subjective:    CC: chest pain, respiratory symptoms  HPI: Larry Mercado 39 year old male presenting via MyChart video visit for reports of chest pain, shortness of breath, nonproductive cough, and chills. He was seen virtually by a [facility] 7 days ago for new onset sinus congestion, PND, and burning in his chest. He was prescribed a burst of prednisone as well as Azithrymycin. Notes the [facility] told him to wait a week to start the Azithromycin to see if his symptoms improved with prednisone alone. He completed the prednisone yesterday but feels that it did not help any. He started the Azithromycin today.  On Sunday, his left chest began to hurt in what felt like lung pain. Today, his right side is also hurting. He has been monitoring his O2 at home and has not seen any readings below 90% on RA. He had a COVID test done on Monday and is still awaiting results. Has been using Albuterol inhaler as prescribed with some relief of his work of breathing. Has been taking Dayquil but this has not helped much. Denies fever, nausea, vomiting, and diarrhea but has noted an increase in belching/burping over the past week.   Past medical history, Surgical history, Family history not pertinant except as noted below, Social history, Allergies, and medications have been entered into the medical record, reviewed, and corrections made.   Review of Systems: See HPI for pertinent positives and negatives.   Objective:    General: Speaking clearly in complete sentences without any  shortness of breath.  Alert and oriented x3.  Normal judgment. No apparent acute distress.  Impression and Recommendations:    1. Chest pain on breathing/shortness of breath COVID results pending. Advised patient to notify our office if he receives a positive test. With his worsening chest pain/DOE, getting a chest x-ray today. Checking CBC w/diff. Continue Azithromycin as prescribed. Okay to continue Dayquil or other OTC cold/flu/cough medication. Continue prescribed inhalers.  - DG Chest 2 View; Future - CBC with Differential  I discussed the assessment and treatment plan with the patient. The patient was provided an opportunity to ask questions and all were answered. The patient agreed with the plan and demonstrated an understanding of the instructions.   The patient was advised to call back or seek an in-person evaluation if the symptoms worsen or if the condition fails to improve as anticipated.  20 minutes of non-face-to-face time was provided during this encounter.  Return if symptoms worsen or fail to improve.  Thayer Ohm, DNP, APRN, FNP-BC [facility]

## 2020-10-08 LAB — CBC WITH DIFFERENTIAL/PLATELET
Absolute Monocytes: 799 cells/uL (ref 200–950)
Basophils Absolute: 34 cells/uL (ref 0–200)
Basophils Relative: 0.4 %
Eosinophils Absolute: 136 cells/uL (ref 15–500)
Eosinophils Relative: 1.6 %
HCT: 46.4 % (ref 38.5–50.0)
Hemoglobin: 16.1 g/dL (ref 13.2–17.1)
Lymphs Abs: 2491 cells/uL (ref 850–3900)
MCH: 30.3 pg (ref 27.0–33.0)
MCHC: 34.7 g/dL (ref 32.0–36.0)
MCV: 87.4 fL (ref 80.0–100.0)
MPV: 10.8 fL (ref 7.5–12.5)
Monocytes Relative: 9.4 %
Neutro Abs: 5041 cells/uL (ref 1500–7800)
Neutrophils Relative %: 59.3 %
Platelets: 250 10*3/uL (ref 140–400)
RBC: 5.31 10*6/uL (ref 4.20–5.80)
RDW: 12.4 % (ref 11.0–15.0)
Total Lymphocyte: 29.3 %
WBC: 8.5 10*3/uL (ref 3.8–10.8)

## 2020-10-09 NOTE — Progress Notes (Signed)
Cervical x-ray shows suspected muscle spasms in the neck which can cause change in the natural curve of the neck. There does not seem to be any bony abnormalities. The change in the curve of the neck can certainly cause the headaches.   I recommend following up with Dr. Karie Schwalbe for further evaluation and treatment plan. You may also benefit from physical therapy to help restore the normal curve in the neck, which should help reduce your symptoms.

## 2020-10-22 ENCOUNTER — Other Ambulatory Visit: Payer: Self-pay | Admitting: Nurse Practitioner

## 2020-10-22 ENCOUNTER — Telehealth: Payer: Self-pay | Admitting: Nurse Practitioner

## 2020-10-22 DIAGNOSIS — R519 Headache, unspecified: Secondary | ICD-10-CM

## 2020-10-22 DIAGNOSIS — M5481 Occipital neuralgia: Secondary | ICD-10-CM

## 2020-10-22 DIAGNOSIS — G4486 Cervicogenic headache: Secondary | ICD-10-CM

## 2020-10-22 DIAGNOSIS — G8929 Other chronic pain: Secondary | ICD-10-CM

## 2020-10-22 NOTE — Telephone Encounter (Signed)
I have placed an order for c-spine and brain MRI for this patient. Is there a way we can determine if any of the sites the MRI travels has openings next week so we can get this done before the end of the year?

## 2020-10-22 NOTE — Telephone Encounter (Signed)
Encounter opened in error

## 2020-10-22 NOTE — Progress Notes (Signed)
MRI of brain w/wo contrast and C-spine wo contrast ordered for increased frequency and intensity of headaches despite conservative treatments with neck exercises, NSAIDs, muscle relaxants, and gabapentin. Previous x-ray reveals reversal of cervical lordosis with questionable muscle spasm present and cervical flexion to the right. Patient reports headaches are becoming more intense and frequent and are waking him in the night. He does have a family history (father) of brain tumor. Given the worsening symptoms despite conservative treatment and family history, I feel that our next best step is detailed imaging.

## 2020-11-02 ENCOUNTER — Telehealth (INDEPENDENT_AMBULATORY_CARE_PROVIDER_SITE_OTHER): Payer: BC Managed Care – PPO | Admitting: Nurse Practitioner

## 2020-11-02 ENCOUNTER — Encounter: Payer: Self-pay | Admitting: Nurse Practitioner

## 2020-11-02 ENCOUNTER — Ambulatory Visit (INDEPENDENT_AMBULATORY_CARE_PROVIDER_SITE_OTHER): Payer: BC Managed Care – PPO

## 2020-11-02 ENCOUNTER — Other Ambulatory Visit: Payer: Self-pay

## 2020-11-02 DIAGNOSIS — R21 Rash and other nonspecific skin eruption: Secondary | ICD-10-CM | POA: Diagnosis not present

## 2020-11-02 DIAGNOSIS — R0789 Other chest pain: Secondary | ICD-10-CM

## 2020-11-02 DIAGNOSIS — R079 Chest pain, unspecified: Secondary | ICD-10-CM

## 2020-11-02 MED ORDER — IBUPROFEN 800 MG PO TABS: 800.0000 mg | ORAL_TABLET | Freq: Three times a day (TID) | ORAL | 2 refills | Status: DC | PRN

## 2020-11-02 NOTE — Patient Instructions (Signed)
I suspect costochondritis, most specifically a form of costochondritis called Tietze syndrome in which inflammation of the muscles around the lower rib cage is present. I have included information on costochondritis below as well as some stretching exercises that may be helpful.   I do recommend Ice and/or heat application for 20 minutes at a time at least 3-4 times a day to help with the pain and inflammation.   I will let you know what the x-ray says and if we need to make changes based on the x-ray results.   Stretches for patients with a confirmed diagnosis of costochondritis Stretch Description  Doorway stretch Stand facing an open doorway. Raise your arms to the sides and bend your elbows to a 90 degree angle. Rest your forearm against the wall and with your elbow at shoulder height, and lean forward through the open doorway to stretch your chest muscles. Hold the stretch for 30 to 60 seconds before relaxing. Repeat 10 times. Variation: Raise your hands higher on the doorframe while stretching.  Foam roller or rolled towel stretch Lie down with a foam roller or rolled towel under your midback, knees bent comfortably, with arms out to the sides and elbows bent gently (to approximately 20 degrees). Hold this pose for 20 seconds then roll off of (or remove) the towel or roller. There should be no tension in the upper back during this rest period. Repeat 10 times. Variation: Move your arms slowly along the floor in an arc (as if you are making snow angels).  Stability ball stretch Sitting on a stability ball, roll down until your upper back is on the ball and your legs form a bridge. Relax arms to the sides; they should drop below your body. Hold pose for 60 seconds then relax. Repeat 10 times.  Sphinx pose Lie on your stomach while supporting yourself on your elbows. Then open your chest, stretching up and backwards, arching the back. Hold this pose for 10 seconds. Relax to a prone (face-down)  position for 20 seconds. Repeat 10 times.  Lateral flexion Sit with your right arm raised above your head; use your left arm for support. Gently lean to the left and hold for 20 seconds. Repeat the same stretch towards the opposite side. Repeat cycle 10 times. Variations: Perform the lateral flexion exercise while standing. In addition, after straightening, you can flex forward, bending at the waist.  Patients should perform all of the stretches for optimal results. Stretching routines should be done once daily for 6 weeks then 3 times weekly for 6 additional weeks. Apply heat to the costochondral area for at least 5 minutes immediately before and after stretching (using a hot water bottle, a heating pad on low heat, or a warmed, moist cloth) to increase blood flow and relax the muscles. After stretching, cold may also be applied to the affected area (using an ice pack covered in a towel); 10 minutes on, then 10 minutes off, for 3 repetitions if the patient finds this helpful. If any of these exercises increase pain, stop immediately and rest to avoid injury. Avoid any additional exercises that exacerbate the symptoms.   Costochondritis  Costochondritis is swelling and irritation (inflammation) of the tissue (cartilage) that connects your ribs to your breastbone (sternum). This causes pain in the front of your chest. The pain usually starts gradually and involves more than one rib. What are the causes? The exact cause of this condition is not always known. It results from stress on the  cartilage where your ribs attach to your sternum. The cause of this stress could be:  Chest injury (trauma).  Exercise or activity, such as lifting.  Severe coughing. What increases the risk? You may be at higher risk for this condition if you:  Are male.  Are 32?38 years old.  Recently started a new exercise or work activity.  Have low levels of vitamin D.  Have a condition that makes you cough  frequently. What are the signs or symptoms? The main symptom of this condition is chest pain. The pain:  Usually starts gradually and can be sharp or dull.  Gets worse with deep breathing, coughing, or exercise.  Gets better with rest.  May be worse when you press on the sternum-rib connection (tenderness). How is this diagnosed? This condition is diagnosed based on your symptoms, medical history, and a physical exam. Your health care provider will check for tenderness when pressing on your sternum. This is the most important finding. You may also have tests to rule out other causes of chest pain. These may include:  A chest X-ray to check for lung problems.  An electrocardiogram (ECG) to see if you have a heart problem that could be causing the pain.  An imaging scan to rule out a chest or rib fracture. How is this treated? This condition usually goes away on its own over time. Your health care provider may prescribe an NSAID to reduce pain and inflammation. Your health care provider may also suggest that you:  Rest and avoid activities that make pain worse.  Apply heat or cold to the area to reduce pain and inflammation.  Do exercises to stretch your chest muscles. If these treatments do not help, your health care provider may inject a numbing medicine at the sternum-rib connection to help relieve the pain. Follow these instructions at home:  Avoid activities that make pain worse. This includes any activities that use chest, abdominal, and side muscles.  If directed, put ice on the painful area: ? Put ice in a plastic bag. ? Place a towel between your skin and the bag. ? Leave the ice on for 20 minutes, 2-3 times a day.  If directed, apply heat to the affected area as often as told by your health care provider. Use the heat source that your health care provider recommends, such as a moist heat pack or a heating pad. ? Place a towel between your skin and the heat  source. ? Leave the heat on for 20-30 minutes. ? Remove the heat if your skin turns bright red. This is especially important if you are unable to feel pain, heat, or cold. You may have a greater risk of getting burned.  Take over-the-counter and prescription medicines only as told by your health care provider.  Return to your normal activities as told by your health care provider. Ask your health care provider what activities are safe for you.  Keep all follow-up visits as told by your health care provider. This is important. Contact a health care provider if:  You have chills or a fever.  Your pain does not go away or it gets worse.  You have a cough that does not go away (is persistent). Get help right away if:  You have shortness of breath. This information is not intended to replace advice given to you by your health care provider. Make sure you discuss any questions you have with your health care provider. Document Revised: 11/01/2017 Document Reviewed: 02/10/2016  Elsevier Patient Education  [employer].

## 2020-11-02 NOTE — Progress Notes (Signed)
Virtual Video Visit via MyChart Note  I connected with  Larry Mercado on 11/02/20 at  4:10 PM EST by the video enabled telemedicine application for , MyChart, and verified that I am speaking with the correct person using two identifiers.   I introduced myself as a Publishing rights manager with the practice. We discussed the limitations of evaluation and management by telemedicine and the availability of in person appointments. The patient expressed understanding and agreed to proceed.  Participating parties in this visit include: The patient and the nurse practitioner listed.  The patient is: At home I am: In the office  Subjective:    CC:  Chief Complaint  Patient presents with  . Soreness in Chest    HPI: [place]re Harbor is a 40 y.o. year old male presenting today via MyChart today for anterior chest wall pain located primarily in the area of the lung bases. He reports that he was diagnosed with bronchitis in mid December. He was placed on a short steroid burst and subsequently started on antibiotics for prolonged course of symptoms. He did have a chest x-ray, which was negative for pneumonia at that time. He reports about 10 days after the start of the bronchitis he started to experience soreness in his lower ribs. He tells me the pain seemed to move around a bit and often different spots in the lower portion of his ribs were sore to the touch. He tells me that last night the pain intensified and seemed to run laterally across the lower part of his ribs at the area of the diaphragm and most distal ribs. He tells me it was painful to lay on and tender to touch. This concerned him, because initially this seemed to be improving somewhat. He does tell me that this weekend he worked outside pulling weeds a little and he is unsure if that could have exacerbated the symptoms. He tells me that it will initially feel a little better with deep breaths, but then hurts worse after this. He also tells me the pain  has woke him up in the night almost nightly.   He denies fever, chills, chronic cough, productive cough, palpitations, dizziness, upper chest pain, radiation into the neck or upper extremities, swelling in his lower extremities, shortness of breath at rest or with exertion, increased fatigue, night sweats, ripping or tearing sensation, or reflux.   Past medical history, Surgical history, Family history not pertinant except as noted below, Social history, Allergies, and medications have been entered into the medical record, reviewed, and corrections made.   Review of Systems:  All review of systems negative except what is listed in the HPI   Objective:    General:  Speaking clearly in complete sentences. Absent shortness of breath noted.   Alert and oriented x3.   Normal judgment.  Absent acute distress.   Impression and Recommendations:    1. Anterior chest wall pain Symptoms and presentation of distal anterior rib pain, with tenderness to the touch, following recent illness with bronchitis.  Discussed with patient that symptoms are consistent with costochondritis. Differentials also include Tietze's syndrome, as he does endorse a feeling of inflammation in the area.  He does have concern that there is possible damage to the ribs or the lungs and would like to have a chest x-ray to rule this out. I do not feel this is unreasonable given the length of time he has been having symptoms, although, it is less likely given the broadened area of pain that  he describes more recently. We will obtain CXR today to rule out bony or pleural etiology.  At this time, recommend begin treatment for costochondritis with ibuprofen 800mg  every 8 hours for the next 2 weeks with intermittent use of ice and/or heat (which ever provides relief). He would like to avoid steroid burst, if possible, to avoid interruption with his sleep.  Will notify patient of results of x-ray and follow-up as necessary.  Patient  encouraged to notify in event that symptoms worsen or change.  - DG Chest 2 View - ibuprofen (ADVIL) 800 MG tablet; Take 1 tablet (800 mg total) by mouth every 8 (eight) hours as needed.  Dispense: 60 tablet; Refill: 2   Follow-up if symptoms worsen or fail to improve.    I discussed the assessment and treatment plan with the patient. The patient was provided an opportunity to ask questions and all were answered. The patient agreed with the plan and demonstrated an understanding of the instructions.   The patient was advised to call back or seek an in-person evaluation if the symptoms worsen or if the condition fails to improve as anticipated.  I provided 26 minutes of non-face-to-face interaction with this MYCHART visit including intake, same-day documentation, and chart review.   [place], NP

## 2020-11-03 NOTE — Progress Notes (Signed)
Larry Mercado,   Your chest x-ray shows the lungs are clear with no signs of infection. The ribs and other bones are normal. Your symptoms are likely related to the inflammation we discussed. Continue with the plan we discussed yesterday with ibuprofen, ice, and/or heat. It may take a few weeks for this to completely resolve.

## 2020-11-10 ENCOUNTER — Encounter: Payer: Self-pay | Admitting: Medical-Surgical

## 2020-11-13 ENCOUNTER — Telehealth (INDEPENDENT_AMBULATORY_CARE_PROVIDER_SITE_OTHER): Payer: BC Managed Care – PPO | Admitting: Medical-Surgical

## 2020-11-13 DIAGNOSIS — Z20822 Contact with and (suspected) exposure to covid-19: Secondary | ICD-10-CM

## 2020-11-13 MED ORDER — GUAIFENESIN-CODEINE 100-10 MG/5ML PO SYRP: 5.0000 mL | ORAL_SOLUTION | Freq: Three times a day (TID) | ORAL | 0 refills | Status: DC | PRN

## 2020-11-13 MED ORDER — BENZONATATE 200 MG PO CAPS: 200.0000 mg | ORAL_CAPSULE | Freq: Three times a day (TID) | ORAL | 0 refills | Status: DC | PRN

## 2020-11-13 NOTE — Progress Notes (Signed)
Virtual Visit via Telephone   I connected with  Larry Mercado  on 11/13/20 by telephone/telehealth and verified that I am speaking with the correct person using two identifiers.   I discussed the limitations, risks, security and privacy concerns of performing an evaluation and management service by telephone, including the higher likelihood of inaccurate diagnosis and treatment, and the availability of in person appointments.  We also discussed the likely need of an additional face to face encounter for complete and high quality delivery of care.  I also discussed with the patient that there may be a patient responsible charge related to this service. The patient expressed understanding and wishes to proceed.  Provider location is in medical facility. Patient location is at their home, different from provider location. People involved in care of the patient during this telehealth encounter were myself, my nurse/medical assistant, and my front office/scheduling team member.  CC: viral symptoms  HPI: Pleasant 40 year old male presenting via telephone after being unable to connect via MyChart video visit. Reports fever T-max 101, chills, nonproductive cough, muscle aches, and chest discomfort that started yesterday morning. Notes that his MSK chest pain was improving finally but it has now gotten worse. His daughter (5y) tested positive for COVID. [employer] tested yesterday with a negative result. Has been taking Tylenol and Ibuprofen alternating for chest discomfort and fever. Gaylyn Rong also tried heat, ice, [employer]. Has gabapentin at home but has not taken this recently. Has Lidocaine patches but has not used these yet.   Review of Systems: See HPI for pertinent positives and negatives.   Objective Findings:    General: Speaking full sentences, no audible heavy breathing.  Sounds alert and appropriately interactive.    Impression and Recommendations:    1. Suspected COVID-19 virus infection Given close  exposure to his COVID positive daughter, symptoms likely related to COVID 19 infection. Likely tested too early. Recommend retesting in 24-48 hours. Discussed symptomatic care with OTC cold/flu medications. Discussed management of discomfort from costochondritis in the setting of illness. Continue Ibuprofen/Tylenol, heat, ice, Icy Hot as needed. Consider trialing Gabapentin as well as Lidocaine patches to see if these are helpful. Ultimately, aiming for control of cough. Sending in Tessalon perles and Robitussin AC. Recommend using Robitussin AC only at night to help with sleep due to drowsy side effect. Reviewed symptoms to seek emergency care for. Patient verbalized understanding and is agreeable to the plan.   I discussed the above assessment and treatment plan with the patient. The patient was provided an opportunity to ask questions and all were answered. The patient agreed with the plan and demonstrated an understanding of the instructions.   The patient was advised to call back or seek an in-person evaluation if the symptoms worsen or if the condition fails to improve as anticipated.  20 minutes of non-face-to-face time was provided during this encounter.  Return if symptoms worsen or fail to improve. ___________________________________________ Christen Butter, DNP, APRN, FNP-BC Primary Care and Sports Medicine [facility] [place]

## 2020-11-16 ENCOUNTER — Other Ambulatory Visit: Payer: BC Managed Care – PPO

## 2020-11-30 ENCOUNTER — Ambulatory Visit (INDEPENDENT_AMBULATORY_CARE_PROVIDER_SITE_OTHER): Payer: BC Managed Care – PPO

## 2020-11-30 ENCOUNTER — Other Ambulatory Visit: Payer: Self-pay

## 2020-11-30 DIAGNOSIS — R519 Headache, unspecified: Secondary | ICD-10-CM

## 2020-11-30 DIAGNOSIS — G4486 Cervicogenic headache: Secondary | ICD-10-CM

## 2020-11-30 DIAGNOSIS — M5481 Occipital neuralgia: Secondary | ICD-10-CM

## 2020-11-30 DIAGNOSIS — M50221 Other cervical disc displacement at C4-C5 level: Secondary | ICD-10-CM

## 2020-11-30 DIAGNOSIS — M542 Cervicalgia: Secondary | ICD-10-CM | POA: Diagnosis not present

## 2020-11-30 DIAGNOSIS — J341 Cyst and mucocele of nose and nasal sinus: Secondary | ICD-10-CM | POA: Diagnosis not present

## 2020-11-30 DIAGNOSIS — G8929 Other chronic pain: Secondary | ICD-10-CM | POA: Diagnosis not present

## 2020-11-30 MED ORDER — GADOBUTROL 1 MMOL/ML IV SOLN
7.0000 mL | Freq: Once | INTRAVENOUS | Status: AC | PRN
  Administered 2020-11-30: 7.5 mL via INTRAVENOUS

## 2020-12-10 ENCOUNTER — Telehealth: Payer: Self-pay | Admitting: Nurse Practitioner

## 2020-12-10 ENCOUNTER — Other Ambulatory Visit: Payer: Self-pay

## 2020-12-10 ENCOUNTER — Other Ambulatory Visit: Payer: Self-pay | Admitting: Nurse Practitioner

## 2020-12-10 DIAGNOSIS — G9589 Other specified diseases of spinal cord: Secondary | ICD-10-CM | POA: Insufficient documentation

## 2020-12-10 DIAGNOSIS — G4486 Cervicogenic headache: Secondary | ICD-10-CM

## 2020-12-10 DIAGNOSIS — M5020 Other cervical disc displacement, unspecified cervical region: Secondary | ICD-10-CM | POA: Insufficient documentation

## 2020-12-10 DIAGNOSIS — M79642 Pain in left hand: Secondary | ICD-10-CM

## 2020-12-10 DIAGNOSIS — G8929 Other chronic pain: Secondary | ICD-10-CM | POA: Insufficient documentation

## 2020-12-10 DIAGNOSIS — M79641 Pain in right hand: Secondary | ICD-10-CM

## 2020-12-10 DIAGNOSIS — R519 Headache, unspecified: Secondary | ICD-10-CM | POA: Insufficient documentation

## 2020-12-10 NOTE — Progress Notes (Signed)
CT shows changes in the normal curvature of the neck (reversal of cervical lordosis), this is usually caused by changes in the neck muscles from the way we hold our heads and can lead to headaches and other issues. There is also a small area that appears to show a possible compression on the spinal cord, possibly from previous injury. There is also some bulging of the disc in the same area (C4-5). Recommend evaluation with neurology for further evaluation given the consistent headaches and failed conservative interventions.

## 2020-12-10 NOTE — Telephone Encounter (Signed)
-----   Message from Thermon Leyland, [place] sent at 12/10/2020  9:16 AM EST ----- Patient aware of results and recommendations. Pt agreeable to Neurology referral. No further questions or concerns at this time. Referral order has been tee'd up below and ready for review and approval/denial.

## 2020-12-10 NOTE — Progress Notes (Signed)
MRI of brain negative. Please see C-spine MRI results for further impression and recommendations.

## 2020-12-21 ENCOUNTER — Ambulatory Visit: Payer: BC Managed Care – PPO | Admitting: Medical-Surgical

## 2020-12-21 ENCOUNTER — Other Ambulatory Visit (HOSPITAL_COMMUNITY)
Admission: RE | Admit: 2020-12-21 | Discharge: 2020-12-21 | Disposition: A | Discharge status: Home / Self Care | Payer: BC Managed Care – PPO | Source: Ambulatory Visit | Attending: Pulmonary Disease | Admitting: Pulmonary Disease

## 2020-12-21 DIAGNOSIS — Z01812 Encounter for preprocedural laboratory examination: Secondary | ICD-10-CM | POA: Insufficient documentation

## 2020-12-21 DIAGNOSIS — Z20822 Contact with and (suspected) exposure to covid-19: Secondary | ICD-10-CM | POA: Diagnosis not present

## 2020-12-21 LAB — SARS CORONAVIRUS 2 (TAT 6-24 HRS): SARS Coronavirus 2: NEGATIVE

## 2020-12-22 ENCOUNTER — Encounter: Payer: Self-pay | Admitting: Pulmonary Disease

## 2020-12-22 ENCOUNTER — Ambulatory Visit: Payer: BC Managed Care – PPO | Admitting: Pulmonary Disease

## 2020-12-22 ENCOUNTER — Ambulatory Visit (INDEPENDENT_AMBULATORY_CARE_PROVIDER_SITE_OTHER): Payer: BC Managed Care – PPO | Admitting: Pulmonary Disease

## 2020-12-22 ENCOUNTER — Other Ambulatory Visit: Payer: Self-pay

## 2020-12-22 VITALS — BP 130/66 | HR 73 | Temp 97.7°F | Ht 73.0 in | Wt 159.8 lb

## 2020-12-22 DIAGNOSIS — R0600 Dyspnea, unspecified: Secondary | ICD-10-CM

## 2020-12-22 DIAGNOSIS — J4530 Mild persistent asthma, uncomplicated: Secondary | ICD-10-CM | POA: Diagnosis not present

## 2020-12-22 DIAGNOSIS — R0609 Other forms of dyspnea: Secondary | ICD-10-CM

## 2020-12-22 LAB — PULMONARY FUNCTION TEST
DL/VA % pred: 99 %
DL/VA: 4.62 ml/min/mmHg/L
DLCO cor % pred: 90 %
DLCO cor: 31.17 ml/min/mmHg
DLCO unc % pred: 90 %
DLCO unc: 31.17 ml/min/mmHg
FEF 25-75 Post: 3.71 L/sec
FEF 25-75 Pre: 4.22 L/sec
FEF2575-%Change-Post: -12 %
FEF2575-%Pred-Post: 84 %
FEF2575-%Pred-Pre: 95 %
FEV1-%Change-Post: 0 %
FEV1-%Pred-Post: 89 %
FEV1-%Pred-Pre: 90 %
FEV1-Post: 4.19 L
FEV1-Pre: 4.22 L
FEV1FVC-%Change-Post: 2 %
FEV1FVC-%Pred-Pre: 98 %
FEV6-%Change-Post: 0 %
FEV6-%Pred-Post: 88 %
FEV6-%Pred-Pre: 88 %
FEV6-Post: 5.1 L
FEV6-Pre: 5.1 L
FEV6FVC-%Pred-Post: 102 %
FEV6FVC-%Pred-Pre: 102 %
FVC-%Change-Post: -3 %
FVC-%Pred-Post: 87 %
FVC-%Pred-Pre: 90 %
FVC-Post: 5.15 L
FVC-Pre: 5.34 L
Post FEV1/FVC ratio: 81 %
Post FEV6/FVC ratio: 100 %
Pre FEV1/FVC ratio: 79 %
Pre FEV6/FVC Ratio: 100 %
RV % pred: 118 %
RV: 2.29 L
TLC % pred: 102 %
TLC: 7.68 L

## 2020-12-22 NOTE — Progress Notes (Signed)
PFT done today. 

## 2020-12-22 NOTE — Patient Instructions (Addendum)
I have reviewed your lung function test which shows normal lung capacity Continue the Symbicort as prescribed You can follow-up with your primary care for management of mild asthma and come back to the pulmonary clinic as needed.

## 2020-12-22 NOTE — Progress Notes (Signed)
Minor Iden    [id number]    Jul 26, 1981  Primary Care Physician:Jessup, Ander Slade, NP  Referring Physician: Sunnie Nielsen, DO [address] [place],  [place] [address]  Chief complaint: Follow-up for asthma  HPI: 40 year old with no significant past medical history except for mild GERD, acne rosacea. Complains of dyspnea for the past several years.  Symptoms are episodic with no clear aggravating factor.  They can occur at rest and with exertion. Describes feelings of chest tightness, inability to take deep breath.  Resolves spontaneously over time.  He has been evaluated bu cardiology for symptoms of palpitations with normal zio patch and echocardiogram.  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  Relevant family history: No significant family history of lung disease.   Interim history: Continues on Symbicort.  She states that this has overall help with his breathing He occasionally gets episodic dyspnea. Hardly needs to use his rescue inhaler.  Outpatient Encounter Medications as of 12/22/2020  Medication Sig  . albuterol (VENTOLIN HFA) 108 (90 Base) MCG/ACT inhaler Inhale 2 puffs into the lungs every 6 (six) hours as needed for wheezing or shortness of breath.  . budesonide-formoterol (SYMBICORT) 160-4.5 MCG/ACT inhaler Inhale 2 puffs into the lungs in the morning and at bedtime.  . gabapentin (NEURONTIN) 100 MG capsule Take 3 capsules (300 mg total) by mouth 3 (three) times daily.  Marland Kitchen ibuprofen (ADVIL) 800 MG tablet Take 1 tablet (800 mg total) by mouth every 8 (eight) hours as needed.  Marland Kitchen ketoconazole (NIZORAL) 2 % cream Apply topically 2 (two) times daily as needed. To affected area  . [DISCONTINUED] benzonatate (TESSALON) 200 MG capsule Take 1  capsule (200 mg total) by mouth 3 (three) times daily as needed for cough.  . [DISCONTINUED] guaiFENesin-codeine (ROBITUSSIN AC) 100-10 MG/5ML syrup Take 5-10 mLs by mouth 3 (three) times daily as needed for cough.   No facility-administered encounter medications on file as of 12/22/2020.    Allergies as of 12/22/2020 - Review Complete 12/22/2020  Allergen Reaction Noted  . Abilify [aripiprazole]  02/25/2015     Physical Exam: Blood pressure 130/66, pulse 73, temperature 97.7 F (36.5 C), temperature source Temporal, height 6\' 1"  (1.854 m), weight 159 lb 12.8 oz (72.5 kg), SpO2 97 %. Gen:      No acute distress HEENT:  EOMI, sclera anicteric Neck:     No masses; no thyromegaly Lungs:    Clear to auscultation bilaterally; normal respiratory effort CV:         Regular rate and rhythm; no murmurs Abd:      + bowel sounds; soft, non-tender; no palpable masses, no distension Ext:    No edema; adequate peripheral perfusion Skin:      Warm and dry; no rash Neuro: alert and oriented x 3 Psych: normal mood and affect  Data Reviewed: Imaging: Chest x ray 03/12/20- Mild peribronchial thickening. I have reviewed the images personally   PFTs: 12/22/2020 FVC 5.15 [87%], FEV1 4.19 [89%], F/F 81, TLC 7.68 [102%], DLCO 31.17 [90%] Normal test  Labs: CBC 07/02/2020-WBC 7.5, eos 2.8%, absolute eosinophil count 210 RAST panel 07/02/2020-IgE 9, no allergies  Cardiac: Echo 10/11/19 LVEF 60-65%, No LVH. Normal RVSP. Unable to determine PASP as no TR jet  Assessment:  Mild asthma Cadiac  work is negative as noted above Likely has asthma, reactive airway disease given symptoms of mild allergies.and changes of bronchial airway thickening on CXR.  And he had good response to Symbicort  PFTs reviewed with normal lung function Since he is doing well he can follow-up with his primary care and come to pulmonary clinic as needed  Plan/Recommendations: Continue Symbicort Follow-up in pulmonary clinic as  needed  Chilton Greathouse MD [facility] 12/22/2020, 11:59 AM  CC: Sunnie Nielsen, DO

## 2020-12-24 ENCOUNTER — Other Ambulatory Visit: Payer: Self-pay

## 2020-12-24 ENCOUNTER — Encounter: Payer: Self-pay | Admitting: Medical-Surgical

## 2020-12-24 ENCOUNTER — Ambulatory Visit (INDEPENDENT_AMBULATORY_CARE_PROVIDER_SITE_OTHER): Payer: BC Managed Care – PPO | Admitting: Medical-Surgical

## 2020-12-24 VITALS — BP 100/63 | HR 67 | Temp 98.9°F | Ht 73.0 in | Wt 161.3 lb

## 2020-12-24 DIAGNOSIS — Z87438 Personal history of other diseases of male genital organs: Secondary | ICD-10-CM

## 2020-12-24 DIAGNOSIS — G4486 Cervicogenic headache: Secondary | ICD-10-CM | POA: Diagnosis not present

## 2020-12-24 DIAGNOSIS — Z0000 Encounter for general adult medical examination without abnormal findings: Secondary | ICD-10-CM

## 2020-12-24 DIAGNOSIS — Z8679 Personal history of other diseases of the circulatory system: Secondary | ICD-10-CM

## 2020-12-24 DIAGNOSIS — N50819 Testicular pain, unspecified: Secondary | ICD-10-CM

## 2020-12-24 LAB — CBC
HCT: 46.6 % (ref 38.5–50.0)
Hemoglobin: 16 g/dL (ref 13.2–17.1)
MCH: 30.8 pg (ref 27.0–33.0)
MCHC: 34.3 g/dL (ref 32.0–36.0)
MCV: 89.8 fL (ref 80.0–100.0)
MPV: 11 fL (ref 7.5–12.5)
Platelets: 192 10*3/uL (ref 140–400)
RBC: 5.19 10*6/uL (ref 4.20–5.80)
RDW: 12.2 % (ref 11.0–15.0)
WBC: 5.6 10*3/uL (ref 3.8–10.8)

## 2020-12-24 LAB — LIPID PANEL
Cholesterol: 146 mg/dL (ref <200)
HDL: 43 mg/dL (ref >=40)
LDL Cholesterol (Calc): 87 mg/dL (calc)
Non-HDL Cholesterol (Calc): 103 mg/dL (calc) (ref <130)
Total CHOL/HDL Ratio: 3.4 (calc) (ref <5.0)
Triglycerides: 75 mg/dL (ref <150)

## 2020-12-24 LAB — COMPLETE METABOLIC PANEL WITH GFR
AG Ratio: 1.6 (calc) (ref 1.0–2.5)
ALT: 26 U/L (ref 9–46)
AST: 21 U/L (ref 10–40)
Albumin: 4.4 g/dL (ref 3.6–5.1)
Alkaline phosphatase (APISO): 78 U/L (ref 36–130)
BUN: 14 mg/dL (ref 7–25)
CO2: 31 mmol/L (ref 20–32)
Calcium: 9.5 mg/dL (ref 8.6–10.3)
Chloride: 103 mmol/L (ref 98–110)
Creat: 1 mg/dL (ref 0.60–1.35)
GFR, Est African American: 111 mL/min/{1.73_m2} (ref >=60)
GFR, Est Non African American: 96 mL/min/{1.73_m2} (ref >=60)
Globulin: 2.7 g/dL (calc) (ref 1.9–3.7)
Glucose, Bld: 91 mg/dL (ref 65–99)
Potassium: 4.4 mmol/L (ref 3.5–5.3)
Sodium: 140 mmol/L (ref 135–146)
Total Bilirubin: 0.6 mg/dL (ref 0.2–1.2)
Total Protein: 7.1 g/dL (ref 6.1–8.1)

## 2020-12-24 MED ORDER — AMITRIPTYLINE HCL 25 MG PO TABS: ORAL_TABLET | ORAL | 1 refills | Status: DC

## 2020-12-24 NOTE — Progress Notes (Signed)
HPI: Larry Mercado is a 40 y.o. male who  has a past medical history of Allergic rhinitis, Attention deficit, Chronic back pain, Dyslipidemia, Dyspepsia, GERD (gastroesophageal reflux disease), Groin pain, right lower quadrant, Hemorrhoids, IBS (irritable bowel syndrome), Sebaceous cyst, and Vertigo.  he presents to [facility] today, 12/24/20,  for chief complaint of: Annual physical exam  Dentist: UTD, no concerns Eye exam: several years ago, due for recheck Exercise: none Diet: eats all food groups Colon cancer screening: Not started Prostate cancer screening: Not started COVID vaccine: UTD  Concerns:   Testicular pain-history of hydrocele, varicocele, and spermatic vein ligation.  Notes that he does periodically have intermittent testicular pain that most often affects the right testicle.  No swelling or redness.  His most recent episode started last week immediately after intercourse and is described as a mild discomfort that is almost unnoticeable if he is busy and distracted.  Wearing supportive briefs worsened his discomfort so he has returned to wearing loose boxers.  Cervicogenic headache-was started on gabapentin by Di Kindle Early for cervicogenic headache.  He takes 600 mg nightly and 300 mg in the morning.  Has not had any further headaches but notes that he does have some side effects from the gabapentin including poor coordination and brain fog in the mornings and frequent night wakings.  Would like to wean off of gabapentin and try different medication that will hopefully not have similar side effects.  Past medical, surgical, social and family history reviewed:  Patient Active Problem List   Diagnosis Date Noted  . Cervicogenic headache 12/10/2020  . Chronic headache with new features 12/10/2020  . Protrusion of cervical intervertebral disc 12/10/2020  . Myelomalacia of cervical cord (HCC) 12/10/2020  . Lumbar degenerative disc  disease 04/14/2017  . Bilateral hand pain 01/03/2017  . Attention deficit disorder (ADD) in adult 01/03/2017  . Tachycardia 01/03/2017  . Attention deficit hyperactivity disorder (ADHD) 12/14/2015  . Injury of left foot 10/14/2015  . Dyspnea on exertion 02/25/2015  . Anxiety and depression 02/05/2015  . Low serum HDL 02/05/2015  . Rosacea 02/05/2015    Past Surgical History:  Procedure Laterality Date  . COLONOSCOPY  2012   In [place] he believes but don't know where. Was told that he had internal hemorrhoids  . ESOPHAGOGASTRODUODENOSCOPY  2012   believes it was done same time with colonoscopy  . HYDROCELE EXCISION / REPAIR    . MOUTH SURGERY    . SPERMATIC VEIN LIGATION      Social History   Tobacco Use  . Smoking status: Never Smoker  . Smokeless tobacco: Never Used  Substance Use Topics  . Alcohol use: Not Currently    Comment: rarely     Family History  Problem Relation Age of Onset  . Cancer Father        brain  . Mitral valve prolapse Father   . Heart disease Paternal Grandfather   . Alzheimer's disease Maternal Grandmother   . Mesothelioma Paternal Grandmother   . Prostate cancer Maternal Grandfather   . Colon cancer Neg Hx   . Esophageal cancer Neg Hx      Current medication list and allergy/intolerance information reviewed:    Current Outpatient Medications  Medication Sig Dispense Refill  . albuterol (VENTOLIN HFA) 108 (90 Base) MCG/ACT inhaler Inhale 2 puffs into the lungs every 6 (six) hours as needed for wheezing or shortness of breath. 8 g 6  . amitriptyline (ELAVIL) 25 MG tablet Take  0.5 tablets (12.5 mg total) by mouth at bedtime for 7 days, THEN 1 tablet (25 mg total) at bedtime for 23 days. 30 tablet 1  . budesonide-formoterol (SYMBICORT) 160-4.5 MCG/ACT inhaler Inhale 2 puffs into the lungs in the morning and at bedtime. 3 each 6  . gabapentin (NEURONTIN) 100 MG capsule Take 3 capsules (300 mg total) by mouth 3 (three) times daily. 68 capsule  3  . ibuprofen (ADVIL) 800 MG tablet Take 1 tablet (800 mg total) by mouth every 8 (eight) hours as needed. 60 tablet 2  . ketoconazole (NIZORAL) 2 % cream Apply topically 2 (two) times daily as needed. To affected area 30 g 1  . pantoprazole (PROTONIX) 20 MG tablet Take 20 mg by mouth daily.     No current facility-administered medications for this visit.    Allergies  Allergen Reactions  . Abilify [Aripiprazole]     Dizziness Patient said that he said he is not allergic to that medication       Review of Systems:  Constitutional:  No  fever, no chills, No recent illness, No unintentional weight changes. No significant fatigue.   HEENT: No  headache, no vision change, no hearing change, No sore throat, No  sinus pressure  Cardiac: No chest pain, No pressure, No palpitations, No  Orthopnea  Respiratory:  + shortness of breath. No  Cough  Gastrointestinal: + abdominal pain, + reflux, No  nausea, No  vomiting,  No  blood in stool, No  diarrhea, No  constipation   Musculoskeletal: No new myalgia/arthralgia  Skin: No  Rash, No other wounds/concerning lesions  Genitourinary: No  incontinence, No  abnormal genital bleeding, No abnormal genital discharge  Hem/Onc: No  easy bruising/bleeding, No  abnormal lymph node  Endocrine: No cold intolerance,  No heat intolerance. No polyuria/polydipsia/polyphagia   Neurologic: No  weakness, No  dizziness, No  slurred speech/focal weakness/facial droop  Psychiatric: + concerns with depression, No  concerns with anxiety, + sleep problems, No mood problems  Exam:  BP 100/63   Pulse 67   Temp 98.9 F (37.2 C)   Ht 6\' 1"  (1.854 m)   Wt 161 lb 4.8 oz (73.2 kg)   SpO2 98%   BMI 21.28 kg/m   Constitutional: VS see above. General Appearance: alert, well-developed, well-nourished, NAD  Eyes: Normal lids and conjunctive, non-icteric sclera  Ears, Nose, Mouth, Throat: MMM, Normal external inspection ears/nares/mouth/lips/gums. TM normal  bilaterally.   Neck: No masses, trachea midline. No thyroid enlargement. No tenderness/mass appreciated. No lymphadenopathy  Respiratory: Normal respiratory effort. no wheeze, no rhonchi, no rales  Cardiovascular: S1/S2 normal, no murmur, no rub/gallop auscultated. RRR. No lower extremity edema. Pedal pulse II/IV bilaterally PT. No carotid bruit or JVD. No abdominal aortic bruit.  Gastrointestinal: Nontender, no masses. No hepatomegaly, no splenomegaly. No hernia appreciated. Bowel sounds normal. Rectal exam deferred.   Musculoskeletal: Gait normal. No clubbing/cyanosis of digits.   Neurological: Normal balance/coordination. No tremor. No cranial nerve deficit on limited exam. Motor and sensation intact and symmetric. Cerebellar reflexes intact.   Skin: warm, dry, intact. No rash/ulcer. No concerning nevi or subq nodules on limited exam.    Psychiatric: Normal judgment/insight. Normal mood and affect. Oriented x3.    No results found for this or any previous visit (from the past 72 hour(s)).  No results found.   ASSESSMENT/PLAN:     Orders Placed This Encounter  Procedures  . CBC  . COMPLETE METABOLIC PANEL WITH GFR  . Lipid panel  .  Ambulatory referral to Urology    Meds ordered this encounter  Medications  . amitriptyline (ELAVIL) 25 MG tablet    Sig: Take 0.5 tablets (12.5 mg total) by mouth at bedtime for 7 days, THEN 1 tablet (25 mg total) at bedtime for 23 days.    Dispense:  30 tablet    Refill:  1    Order Specific Question:   Supervising Provider    Answer:   Sunnie Nielsen [[id number]]    Patient Instructions   Preventive Care 68-65 Years Old, Male Preventive care refers to lifestyle choices and visits with your health care provider that can promote health and wellness. This includes:  A yearly physical exam. This is also called an annual wellness visit.  Regular dental and eye exams.  Immunizations.  Screening for certain conditions.  Healthy  lifestyle choices, such as: ? Eating a healthy diet. ? Getting regular exercise. ? Not using drugs or products that contain nicotine and tobacco. ? Limiting alcohol use. What can I expect for my preventive care visit? Physical exam Your health care provider may check your:  Height and weight. These may be used to calculate your BMI (body mass index). BMI is a measurement that tells if you are at a healthy weight.  Heart rate and blood pressure.  Body temperature.  Skin for abnormal spots. Counseling Your health care provider may ask you questions about your:  Past medical problems.  Family's medical history.  Alcohol, tobacco, and drug use.  Emotional well-being.  Home life and relationship well-being.  Sexual activity.  Diet, exercise, and sleep habits.  Work and work Astronomer.  Access to firearms. What immunizations do I need? Vaccines are usually given at various ages, according to a schedule. Your health care provider will recommend vaccines for you based on your age, medical history, and lifestyle or other factors, such as travel or where you work.   What tests do I need? Blood tests  Lipid and cholesterol levels. These may be checked every 5 years starting at age 63.  Hepatitis C test.  Hepatitis B test. Screening  Diabetes screening. This is done by checking your blood sugar (glucose) after you have not eaten for a while (fasting).  Genital exam to check for testicular cancer or hernias.  STD (sexually transmitted disease) testing, if you are at risk. Talk with your health care provider about your test results, treatment options, and if necessary, the need for more tests.   Follow these instructions at home: Eating and drinking  Eat a healthy diet that includes fresh fruits and vegetables, whole grains, lean protein, and low-fat dairy products.  Drink enough fluid to keep your urine pale yellow.  Take vitamin and mineral supplements as  recommended by your health care provider.  Do not drink alcohol if your health care provider tells you not to drink.  If you drink alcohol: ? Limit how much you have to 0-2 drinks a day. ? Be aware of how much alcohol is in your drink. In the [place] one drink equals one 12 oz bottle of beer (355 mL), one 5 oz glass of wine (148 mL), or one 1 oz glass of hard liquor (44 mL).   Lifestyle  Take daily care of your teeth and gums. Brush your teeth every morning and night with fluoride toothpaste. Floss one time each day.  Stay active. Exercise for at least 30 minutes 5 or more days each week.  Do not use any products that contain nicotine  or tobacco, such as cigarettes, e-cigarettes, and chewing tobacco. If you need help quitting, ask your health care provider.  Do not use drugs.  If you are sexually active, practice safe sex. Use a condom or other form of protection to prevent STIs (sexually transmitted infections).  Find healthy ways to cope with stress, such as: ? Meditation, yoga, or listening to music. ? Journaling. ? Talking to a trusted person. ? Spending time with friends and family. Safety  Always wear your seat belt while driving or riding in a vehicle.  Do not drive: ? If you have been drinking alcohol. Do not ride with someone who has been drinking. ? When you are tired or distracted. ? While texting.  Wear a helmet and other protective equipment during sports activities.  If you have firearms in your house, make sure you follow all gun safety procedures.  Seek help if you have been physically or sexually abused. What's next?  Go to your health care provider once a year for an annual wellness visit.  Ask your health care provider how often you should have your eyes and teeth checked.  Stay up to date on all vaccines. This information is not intended to replace advice given to you by your health care provider. Make sure you discuss any questions you have with your  health care provider. Document Revised: 07/03/2019 Document Reviewed: 10/11/2018 Elsevier Patient Education  2021 Elsevier Inc.   Food Choices for Gastroesophageal Reflux Disease, Adult When you have gastroesophageal reflux disease (GERD), the foods you eat and your eating habits are very important. Choosing the right foods can help ease your discomfort. Think about working with a food expert (dietitian) to help you make good choices. What are tips for following this plan? Reading food labels  Look for foods that are low in saturated fat. Foods that may help with your symptoms include: ? Foods that have less than 5% of daily value (DV) of fat. ? Foods that have 0 grams of trans fat. Cooking  Do not fry your food.  Cook your food by baking, steaming, grilling, or broiling. These are all methods that do not need a lot of fat for cooking.  To add flavor, try to use herbs that are low in spice and acidity. Meal planning  Choose healthy foods that are low in fat, such as: ? Fruits and vegetables. ? Whole grains. ? Low-fat dairy products. ? Lean meats, fish, and poultry.  Eat small meals often instead of eating 3 large meals each day. Eat your meals slowly in a place where you are relaxed. Avoid bending over or lying down until 2-3 hours after eating.  Limit high-fat foods such as fatty meats or fried foods.  Limit your intake of fatty foods, such as oils, butter, and shortening.  Avoid the following as told by your doctor: ? Foods that cause symptoms. These may be different for different people. Keep a food diary to keep track of foods that cause symptoms. ? Alcohol. ? Drinking a lot of liquid with meals. ? Eating meals during the 2-3 hours before bed.   Lifestyle  Stay at a healthy weight. Ask your doctor what weight is healthy for you. If you need to lose weight, work with your doctor to do so safely.  Exercise for at least 30 minutes on 5 or more days each week, or as told  by your doctor.  Wear loose-fitting clothes.  Do not smoke or use any products that contain nicotine  or tobacco. If you need help quitting, ask your doctor.  Sleep with the head of your bed higher than your feet. Use a wedge under the mattress or blocks under the bed frame to raise the head of the bed.  Chew sugar-free gum after meals. What foods should eat? Eat a healthy, well-balanced diet of fruits, vegetables, whole grains, low-fat dairy products, lean meats, fish, and poultry. Each person is different. Foods that may cause symptoms in one person may not cause any symptoms in another person. Work with your doctor to find foods that are safe for you. The items listed above may not be a complete list of what you can eat and drink. Contact a food expert for more options.   What foods should I avoid? Limiting some of these foods may help in managing the symptoms of GERD. Everyone is different. Talk with a food expert or your doctor to help you find the exact foods to avoid, if any. Fruits Any fruits prepared with added fat. Any fruits that cause symptoms. For some people, this may include citrus fruits, such as oranges, grapefruit, pineapple, and lemons. Vegetables Deep-fried vegetables. [place] fries. Any vegetables prepared with added fat. Any vegetables that cause symptoms. For some people, this may include tomatoes and tomato products, chili peppers, onions and garlic, and horseradish. Grains Pastries or quick breads with added fat. Meats and other proteins High-fat meats, such as fatty beef or pork, hot dogs, ribs, ham, sausage, salami, and bacon. Fried meat or protein, including fried fish and fried chicken. Nuts and nut butters, in large amounts. Dairy Whole milk and chocolate milk. Sour cream. Cream. Ice cream. Cream cheese. Milkshakes. Fats and oils Butter. Margarine. Shortening. Ghee. Beverages Coffee and tea, with or without caffeine. Carbonated beverages. Sodas. Energy drinks.  Fruit juice made with acidic fruits, such as orange or grapefruit. Tomato juice. Alcoholic drinks. Sweets and desserts Chocolate and cocoa. Donuts. Seasonings and condiments Pepper. Peppermint and spearmint. Added salt. Any condiments, herbs, or seasonings that cause symptoms. For some people, this may include curry, hot sauce, or vinegar-based salad dressings. The items listed above may not be a complete list of what you should not eat and drink. Contact a food expert for more options. Questions to ask your doctor Diet and lifestyle changes are often the first steps that are taken to manage symptoms of GERD. If diet and lifestyle changes do not help, talk with your doctor about taking medicines. Where to find more information  International Foundation for Gastrointestinal Disorders: aboutgerd.org Summary  When you have GERD, food and lifestyle choices are very important in easing your symptoms.  Eat small meals often instead of 3 large meals a day. Eat your meals slowly and in a place where you are relaxed.  Avoid bending over or lying down until 2-3 hours after eating.  Limit high-fat foods such as fatty meats or fried foods. This information is not intended to replace advice given to you by your health care provider. Make sure you discuss any questions you have with your health care provider. Document Revised: 04/27/2020 Document Reviewed: 04/27/2020 Elsevier Patient Education  2021 Elsevier Inc.    Follow-up plan: Return in about 4 weeks (around 01/21/2021) for cervicogenic headache follow up.  Thayer OhmJoy L. Jurline Folger, DNP, APRN, FNP-BC [facility]

## 2020-12-24 NOTE — Patient Instructions (Signed)
Preventive Care 21-39 Years Old, Male Preventive care refers to lifestyle choices and visits with your health care provider that can promote health and wellness. This includes:  A yearly physical exam. This is also called an annual wellness visit.  Regular dental and eye exams.  Immunizations.  Screening for certain conditions.  Healthy lifestyle choices, such as: ? Eating a healthy diet. ? Getting regular exercise. ? Not using drugs or products that contain nicotine and tobacco. ? Limiting alcohol use. What can I expect for my preventive care visit? Physical exam Your health care provider may check your:  Height and weight. These may be used to calculate your BMI (body mass index). BMI is a measurement that tells if you are at a healthy weight.  Heart rate and blood pressure.  Body temperature.  Skin for abnormal spots. Counseling Your health care provider may ask you questions about your:  Past medical problems.  Family's medical history.  Alcohol, tobacco, and drug use.  Emotional well-being.  Home life and relationship well-being.  Sexual activity.  Diet, exercise, and sleep habits.  Work and work environment.  Access to firearms. What immunizations do I need? Vaccines are usually given at various ages, according to a schedule. Your health care provider will recommend vaccines for you based on your age, medical history, and lifestyle or other factors, such as travel or where you work.   What tests do I need? Blood tests  Lipid and cholesterol levels. These may be checked every 5 years starting at age 20.  Hepatitis C test.  Hepatitis B test. Screening  Diabetes screening. This is done by checking your blood sugar (glucose) after you have not eaten for a while (fasting).  Genital exam to check for testicular cancer or hernias.  STD (sexually transmitted disease) testing, if you are at risk. Talk with your health care provider about your test results,  treatment options, and if necessary, the need for more tests.   Follow these instructions at home: Eating and drinking  Eat a healthy diet that includes fresh fruits and vegetables, whole grains, lean protein, and low-fat dairy products.  Drink enough fluid to keep your urine pale yellow.  Take vitamin and mineral supplements as recommended by your health care provider.  Do not drink alcohol if your health care provider tells you not to drink.  If you drink alcohol: ? Limit how much you have to 0-2 drinks a day. ? Be aware of how much alcohol is in your drink. In the [place] one drink equals one 12 oz bottle of beer (355 mL), one 5 oz glass of wine (148 mL), or one 1 oz glass of hard liquor (44 mL).   Lifestyle  Take daily care of your teeth and gums. Brush your teeth every morning and night with fluoride toothpaste. Floss one time each day.  Stay active. Exercise for at least 30 minutes 5 or more days each week.  Do not use any products that contain nicotine or tobacco, such as cigarettes, e-cigarettes, and chewing tobacco. If you need help quitting, ask your health care provider.  Do not use drugs.  If you are sexually active, practice safe sex. Use a condom or other form of protection to prevent STIs (sexually transmitted infections).  Find healthy ways to cope with stress, such as: ? Meditation, yoga, or listening to music. ? Journaling. ? Talking to a trusted person. ? Spending time with friends and family. Safety  Always wear your seat belt while driving   or riding in a vehicle.  Do not drive: ? If you have been drinking alcohol. Do not ride with someone who has been drinking. ? When you are tired or distracted. ? While texting.  Wear a helmet and other protective equipment during sports activities.  If you have firearms in your house, make sure you follow all gun safety procedures.  Seek help if you have been physically or sexually abused. What's next?  Go to your  health care provider once a year for an annual wellness visit.  Ask your health care provider how often you should have your eyes and teeth checked.  Stay up to date on all vaccines. This information is not intended to replace advice given to you by your health care provider. Make sure you discuss any questions you have with your health care provider. Document Revised: 07/03/2019 Document Reviewed: 10/11/2018 Elsevier Patient Education  2021 Elsevier Inc.   Food Choices for Gastroesophageal Reflux Disease, Adult When you have gastroesophageal reflux disease (GERD), the foods you eat and your eating habits are very important. Choosing the right foods can help ease your discomfort. Think about working with a food expert (dietitian) to help you make good choices. What are tips for following this plan? Reading food labels  Look for foods that are low in saturated fat. Foods that may help with your symptoms include: ? Foods that have less than 5% of daily value (DV) of fat. ? Foods that have 0 grams of trans fat. Cooking  Do not fry your food.  Cook your food by baking, steaming, grilling, or broiling. These are all methods that do not need a lot of fat for cooking.  To add flavor, try to use herbs that are low in spice and acidity. Meal planning  Choose healthy foods that are low in fat, such as: ? Fruits and vegetables. ? Whole grains. ? Low-fat dairy products. ? Lean meats, fish, and poultry.  Eat small meals often instead of eating 3 large meals each day. Eat your meals slowly in a place where you are relaxed. Avoid bending over or lying down until 2-3 hours after eating.  Limit high-fat foods such as fatty meats or fried foods.  Limit your intake of fatty foods, such as oils, butter, and shortening.  Avoid the following as told by your doctor: ? Foods that cause symptoms. These may be different for different people. Keep a food diary to keep track of foods that cause  symptoms. ? Alcohol. ? Drinking a lot of liquid with meals. ? Eating meals during the 2-3 hours before bed.   Lifestyle  Stay at a healthy weight. Ask your doctor what weight is healthy for you. If you need to lose weight, work with your doctor to do so safely.  Exercise for at least 30 minutes on 5 or more days each week, or as told by your doctor.  Wear loose-fitting clothes.  Do not smoke or use any products that contain nicotine or tobacco. If you need help quitting, ask your doctor.  Sleep with the head of your bed higher than your feet. Use a wedge under the mattress or blocks under the bed frame to raise the head of the bed.  Chew sugar-free gum after meals. What foods should eat? Eat a healthy, well-balanced diet of fruits, vegetables, whole grains, low-fat dairy products, lean meats, fish, and poultry. Each person is different. Foods that may cause symptoms in one person may not cause any symptoms in another  person. Work with your doctor to find foods that are safe for you. The items listed above may not be a complete list of what you can eat and drink. Contact a food expert for more options.   What foods should I avoid? Limiting some of these foods may help in managing the symptoms of GERD. Everyone is different. Talk with a food expert or your doctor to help you find the exact foods to avoid, if any. Fruits Any fruits prepared with added fat. Any fruits that cause symptoms. For some people, this may include citrus fruits, such as oranges, grapefruit, pineapple, and lemons. Vegetables Deep-fried vegetables. [place] fries. Any vegetables prepared with added fat. Any vegetables that cause symptoms. For some people, this may include tomatoes and tomato products, chili peppers, onions and garlic, and horseradish. Grains Pastries or quick breads with added fat. Meats and other proteins High-fat meats, such as fatty beef or pork, hot dogs, ribs, ham, sausage, salami, and bacon. Fried  meat or protein, including fried fish and fried chicken. Nuts and nut butters, in large amounts. Dairy Whole milk and chocolate milk. Sour cream. Cream. Ice cream. Cream cheese. Milkshakes. Fats and oils Butter. Margarine. Shortening. Ghee. Beverages Coffee and tea, with or without caffeine. Carbonated beverages. Sodas. Energy drinks. Fruit juice made with acidic fruits, such as orange or grapefruit. Tomato juice. Alcoholic drinks. Sweets and desserts Chocolate and cocoa. Donuts. Seasonings and condiments Pepper. Peppermint and spearmint. Added salt. Any condiments, herbs, or seasonings that cause symptoms. For some people, this may include curry, hot sauce, or vinegar-based salad dressings. The items listed above may not be a complete list of what you should not eat and drink. Contact a food expert for more options. Questions to ask your doctor Diet and lifestyle changes are often the first steps that are taken to manage symptoms of GERD. If diet and lifestyle changes do not help, talk with your doctor about taking medicines. Where to find more information  International Foundation for Gastrointestinal Disorders: aboutgerd.org Summary  When you have GERD, food and lifestyle choices are very important in easing your symptoms.  Eat small meals often instead of 3 large meals a day. Eat your meals slowly and in a place where you are relaxed.  Avoid bending over or lying down until 2-3 hours after eating.  Limit high-fat foods such as fatty meats or fried foods. This information is not intended to replace advice given to you by your health care provider. Make sure you discuss any questions you have with your health care provider. Document Revised: 04/27/2020 Document Reviewed: 04/27/2020 Elsevier Patient Education  2021 [employer].

## 2021-01-08 ENCOUNTER — Encounter: Payer: Self-pay | Admitting: Medical-Surgical

## 2021-01-12 ENCOUNTER — Other Ambulatory Visit: Payer: Self-pay | Admitting: Medical-Surgical

## 2021-01-12 ENCOUNTER — Other Ambulatory Visit: Payer: Self-pay

## 2021-01-12 ENCOUNTER — Ambulatory Visit: Payer: BC Managed Care – PPO | Admitting: Sports Medicine

## 2021-01-12 DIAGNOSIS — M25551 Pain in right hip: Secondary | ICD-10-CM

## 2021-01-12 DIAGNOSIS — M25552 Pain in left hip: Secondary | ICD-10-CM

## 2021-01-12 DIAGNOSIS — S73191A Other sprain of right hip, initial encounter: Secondary | ICD-10-CM | POA: Insufficient documentation

## 2021-01-12 MED ORDER — MELOXICAM 15 MG PO TABS: ORAL_TABLET | ORAL | 3 refills | Status: DC

## 2021-01-12 NOTE — Assessment & Plan Note (Signed)
This is a very pleasant 40 year old male, he works in a bank, for a year now he had pain in both hips, localized in the groin, right worse than left. Worse with abduction and flexion. On exam he has a positive flexion/adduction/internal rotation sign worse on the right. I do think this is consistent with either hip labral injury or femoroacetabular impingement syndrome. We will switch from ibuprofen to meloxicam. Due to the duration of symptoms and failure of conservative treatment we are like proceed with an MR arthrogram of his right hip to rule out a labral tear. We will also be looking for other signs of femoroacetabular impingement as well as avascular necrosis.

## 2021-01-12 NOTE — Patient Instructions (Signed)
Learn about femoroacetabular impingement syndrome and hip labral tears.

## 2021-01-12 NOTE — Progress Notes (Signed)
    Procedures performed today:    None.  Independent interpretation of notes and tests performed by another provider:   I did personally review his x-rays, he does have on the right side a cam/pistol-grip deformity that could predispose to femoroacetabular impingement.  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 Worse with abduction and flexion. On exam he has a positive flexion/adduction/internal rotation sign worse on the right. I do think this is consistent with either hip labral injury or femoroacetabular impingement syndrome. We will switch from ibuprofen to meloxicam. Due to the duration of symptoms and failure of conservative treatment we are like proceed with an MR arthrogram of his right hip to rule out a labral tear. We will also be looking for other signs of femoroacetabular impingement as well as avascular necrosis.    ___________________________________________ Ihor Austin. Benjamin Stain, M.D., ABFM., CAQSM. Primary Care and Sports Medicine [facility]  Adjunct Instructor of Family Medicine  [employer]

## 2021-01-22 ENCOUNTER — Telehealth: Payer: BC Managed Care – PPO | Admitting: Medical-Surgical

## 2021-01-22 ENCOUNTER — Encounter: Payer: Self-pay | Admitting: Medical-Surgical

## 2021-01-22 DIAGNOSIS — K219 Gastro-esophageal reflux disease without esophagitis: Secondary | ICD-10-CM | POA: Diagnosis not present

## 2021-01-22 DIAGNOSIS — R0789 Other chest pain: Secondary | ICD-10-CM | POA: Diagnosis not present

## 2021-01-22 DIAGNOSIS — G4486 Cervicogenic headache: Secondary | ICD-10-CM

## 2021-01-22 MED ORDER — AMITRIPTYLINE HCL 25 MG PO TABS: 25.0000 mg | ORAL_TABLET | Freq: Every day | ORAL | 3 refills | Status: DC

## 2021-01-22 MED ORDER — PANTOPRAZOLE SODIUM 40 MG PO TBEC: 40.0000 mg | DELAYED_RELEASE_TABLET | Freq: Every day | ORAL | 1 refills | Status: DC

## 2021-01-22 MED ORDER — PREDNISONE 50 MG PO TABS: 50.0000 mg | ORAL_TABLET | Freq: Every day | ORAL | 0 refills | Status: DC

## 2021-01-22 NOTE — Progress Notes (Signed)
Virtual Visit via Video Note  I connected with Larry Mercado on 01/22/21 at  3:40 PM EDT by a video enabled telemedicine application and verified that I am speaking with the correct person using two identifiers.   I discussed the limitations of evaluation and management by telemedicine and the availability of in person appointments. The patient expressed understanding and agreed to proceed.  Patient location: home Provider locations: office  Subjective:    CC: Headache/GERD follow-up  HPI: Pleasant 40 year old male presenting via MyChart video visit for follow-up of:  Headache-has been doing well on amitriptyline 25 mg nightly at bedtime, tolerating well without side effects.  Notes that his headaches have quite improved.  GERD-taking Protonix 20 mg daily but notes this is not helping much with his stomach issues.  Has regurgitation when overeating or eating certain foods such as tomato based sauces.  Wonders if he can get a higher dose or twice daily dosing of the Protonix to see if this is more beneficial.  He does still have a lot of pressure and gas buildup and eats which is very uncomfortable.  Chest wall pain-taking meloxicam 15 mg daily which helps with his hip but not so much with his chest.  He does note that his symptoms were improving but recently have worsened again.  Wonders if he may have overdone his activity level when he was working around his house.   Past medical history, Surgical history, Family history not pertinant except as noted below, Social history, Allergies, and medications have been entered into the medical record, reviewed, and corrections made.   Review of Systems: See HPI for pertinent positives and negatives.   Objective:    General: Speaking clearly in complete sentences without any shortness of breath.  Alert and oriented x3.  Normal judgment. No apparent acute distress.  Impression and Recommendations:    1. Cervicogenic headache Continue  amitriptyline 25 mg nightly.  As long as headaches are well managed with this, we will not increase dose but if they should worsen or come more frequent, we may need to increase his dose to 50 mg nightly.  2. Anterior chest wall pain Sending in a prednisone 50 mg daily x5 days.  While taking prednisone, advised to stop meloxicam and restart it the day after he takes his last prednisone dose.  3. Gastroesophageal reflux disease without esophagitis Increasing Protonix to 40 mg daily.  Advised to try this for couple of weeks and if symptoms are still not well controlled, add famotidine 20 mg at night.  Since he does have quite a bit of gas buildup and pressure, recommend using over-the-counter simethicone to see if this is beneficial.  Ultimately, we may need to get him in with GI to discuss further management, EGD evaluation, or even possibly a TIF procedure.  I discussed the assessment and treatment plan with the patient. The patient was provided an opportunity to ask questions and all were answered. The patient agreed with the plan and demonstrated an understanding of the instructions.   The patient was advised to call back or seek an in-person evaluation if the symptoms worsen or if the condition fails to improve as anticipated.  20 minutes of non-face-to-face time was provided during this encounter.  Return in about 3 months (around 04/24/2021) for ADHD/headache/GERD follow up.  Thayer Ohm, DNP, APRN, FNP-BC [facility] and Sports Medicine

## 2021-01-25 ENCOUNTER — Ambulatory Visit (INDEPENDENT_AMBULATORY_CARE_PROVIDER_SITE_OTHER): Payer: BC Managed Care – PPO

## 2021-01-25 ENCOUNTER — Other Ambulatory Visit: Payer: Self-pay

## 2021-01-25 ENCOUNTER — Ambulatory Visit: Payer: BC Managed Care – PPO | Admitting: Sports Medicine

## 2021-01-25 DIAGNOSIS — S73191A Other sprain of right hip, initial encounter: Secondary | ICD-10-CM | POA: Diagnosis not present

## 2021-01-25 DIAGNOSIS — M25552 Pain in left hip: Secondary | ICD-10-CM

## 2021-01-25 DIAGNOSIS — G8929 Other chronic pain: Secondary | ICD-10-CM | POA: Diagnosis not present

## 2021-01-25 DIAGNOSIS — M25551 Pain in right hip: Secondary | ICD-10-CM

## 2021-01-25 MED ORDER — GADOBUTROL 1 MMOL/ML IV SOLN
1.0000 mL | Freq: Once | INTRAVENOUS | Status: AC | PRN
  Administered 2021-01-25: 1 mL via INTRAVENOUS

## 2021-01-25 NOTE — Progress Notes (Signed)
    Procedures performed today:    Procedure: Real-time Ultrasound Guided gadolinium contrast injection of right hip joint Device: Samsung HS60  Verbal informed consent obtained.  Time-out conducted.  Noted no overlying erythema, induration, or other signs of local infection.  Skin prepped in a sterile fashion.  Local anesthesia: Topical Ethyl chloride.  With sterile technique and under real time ultrasound guidance: Noted uneffused joint, 22-gauge needle advanced to the femoral head/neck junction, contacted bone, I injected 1 cc kenalog 40, 2 cc lidocaine, 2 cc bupivacaine, syringe switched and 0.1 cc gadolinium injected, syringe again switched and 10 cc sterile saline used to fully distend the joint. Joint visualized and capsule seen distending confirming intra-articular placement of contrast material and medication. Completed without difficulty  Advised to call if fevers/chills, erythema, induration, drainage, or persistent bleeding.  Images permanently stored in PACS Impression: Technically successful ultrasound guided gadolinium contrast injection for MR arthrography.  Please see separate MR arthrogram report.   Independent interpretation of notes and tests performed by another provider:   None.  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 Bilateral hip pain, he has positive flexion adduction, internal rotation sign worse on the right consistent with labral injury versus hip impingement syndrome. Today we are doing his arthrogram for hip MRI looking for labral tear. Treatment will depend on MRI results.    ___________________________________________ Larry Mercado. Larry Mercado, M.D., ABFM., CAQSM. Primary Care and Sports Medicine [facility]  Adjunct Instructor of Family Medicine  [employer]

## 2021-01-25 NOTE — Assessment & Plan Note (Signed)
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 Bilateral hip pain, he has positive flexion adduction, internal rotation sign worse on the right consistent with labral injury versus hip impingement syndrome. Today we are doing his arthrogram for hip MRI looking for labral tear. Treatment will depend on MRI results.

## 2021-01-26 DIAGNOSIS — M25551 Pain in right hip: Secondary | ICD-10-CM

## 2021-01-26 DIAGNOSIS — S73191A Other sprain of right hip, initial encounter: Secondary | ICD-10-CM

## 2021-02-03 IMAGING — DX DG CERVICAL SPINE COMPLETE 4+V
6 series · 6 of 6 positions shown · non-contrast
Comparison: None

CLINICAL DATA: BILATERAL occipital neuralgia, no known injury

EXAM:
CERVICAL SPINE - COMPLETE 4+ VIEW

[c-spine lat]
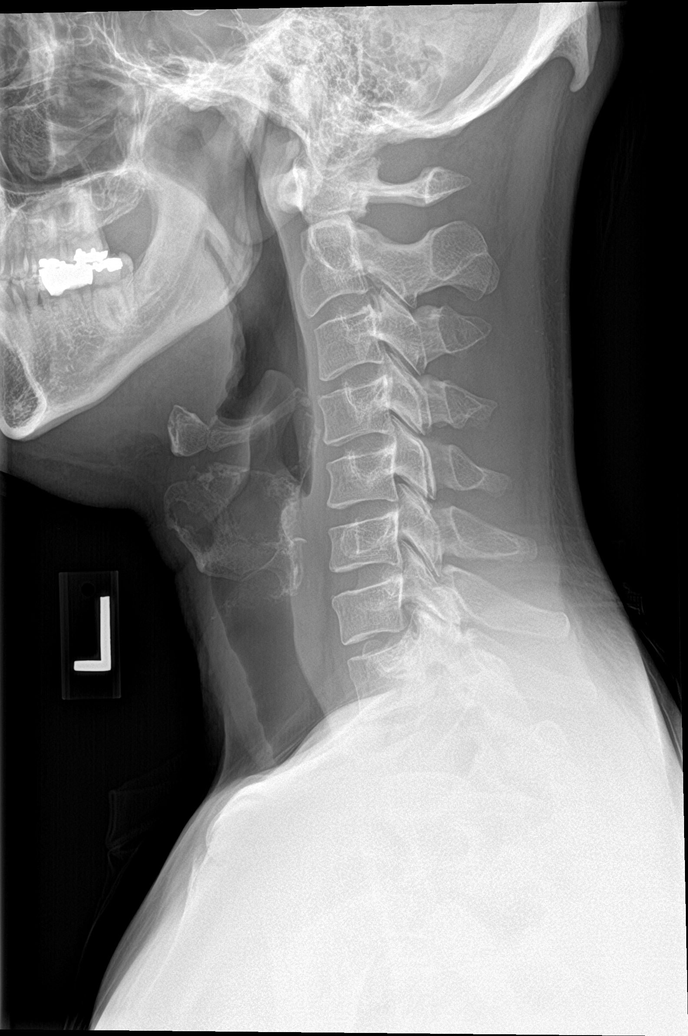

[c-spine obl (1 of 2)]
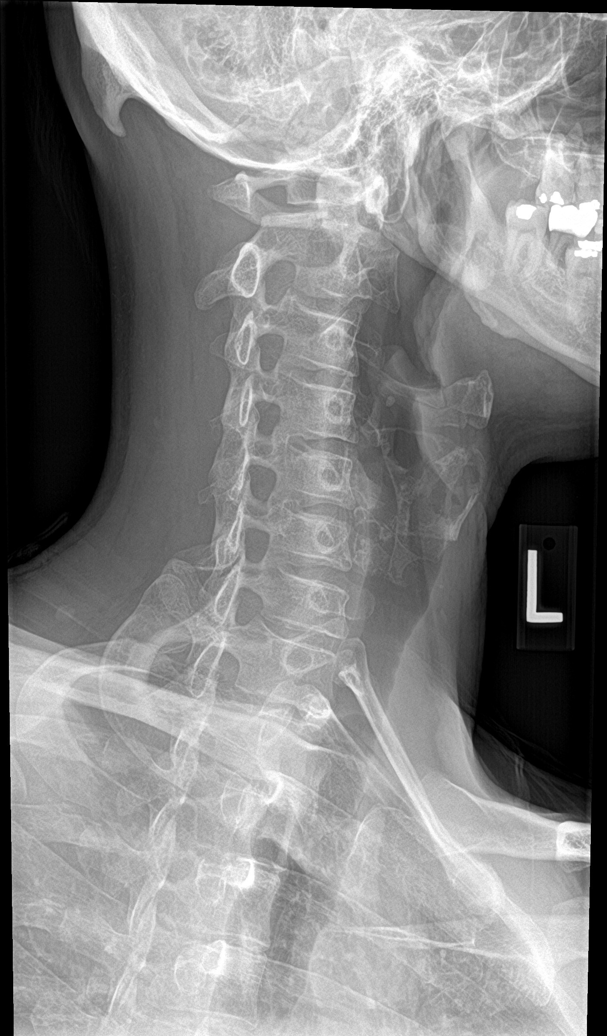

[c-spine obl (2 of 2)]
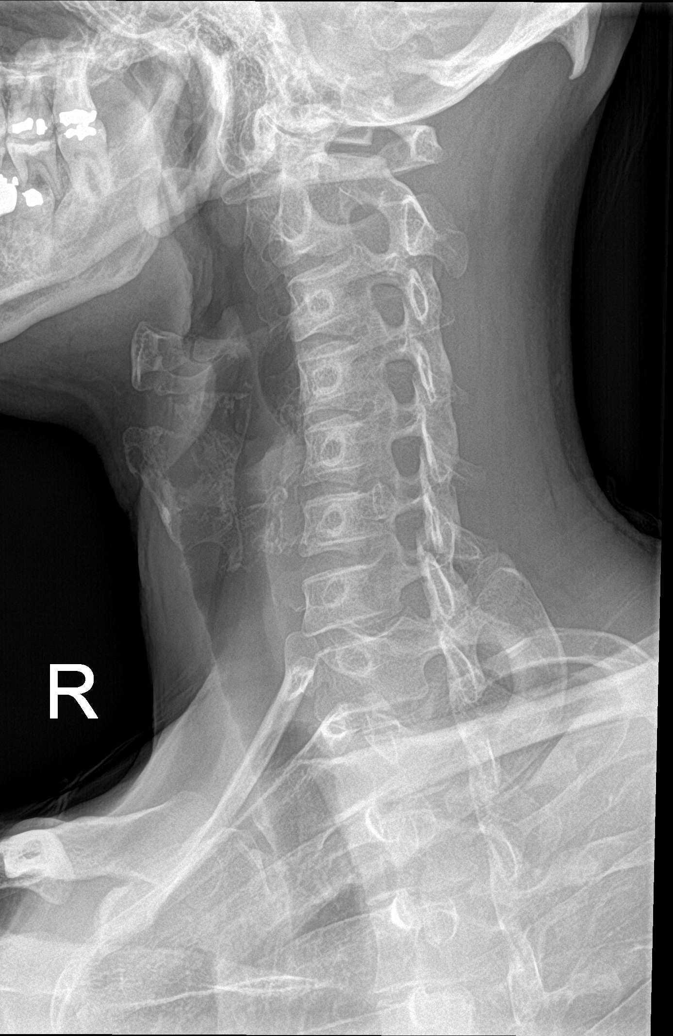

[c-spine ap]
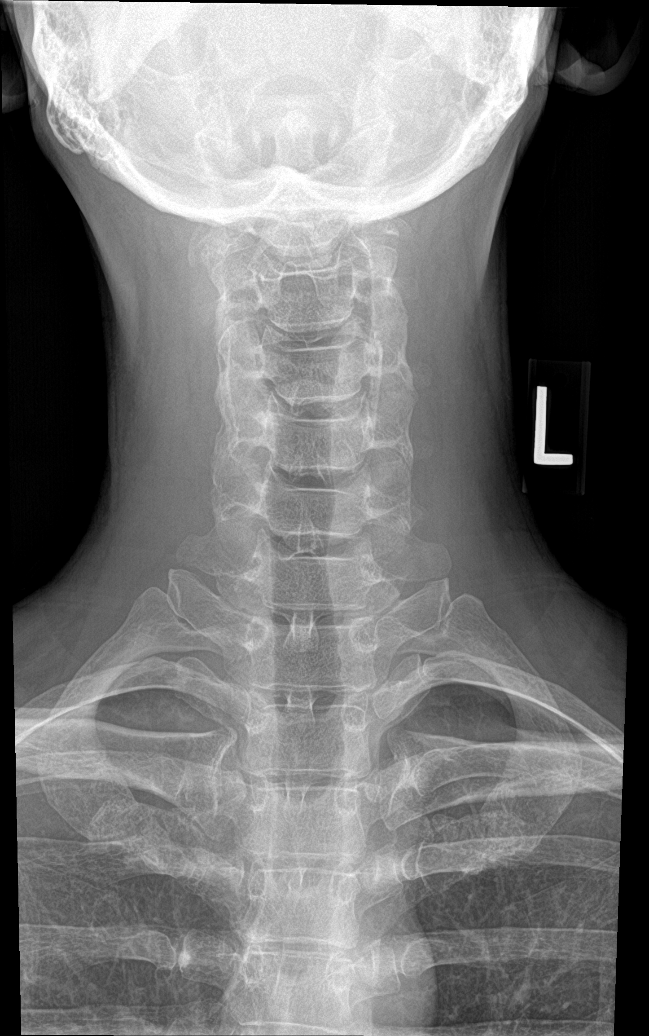

[c-spine open mouth]
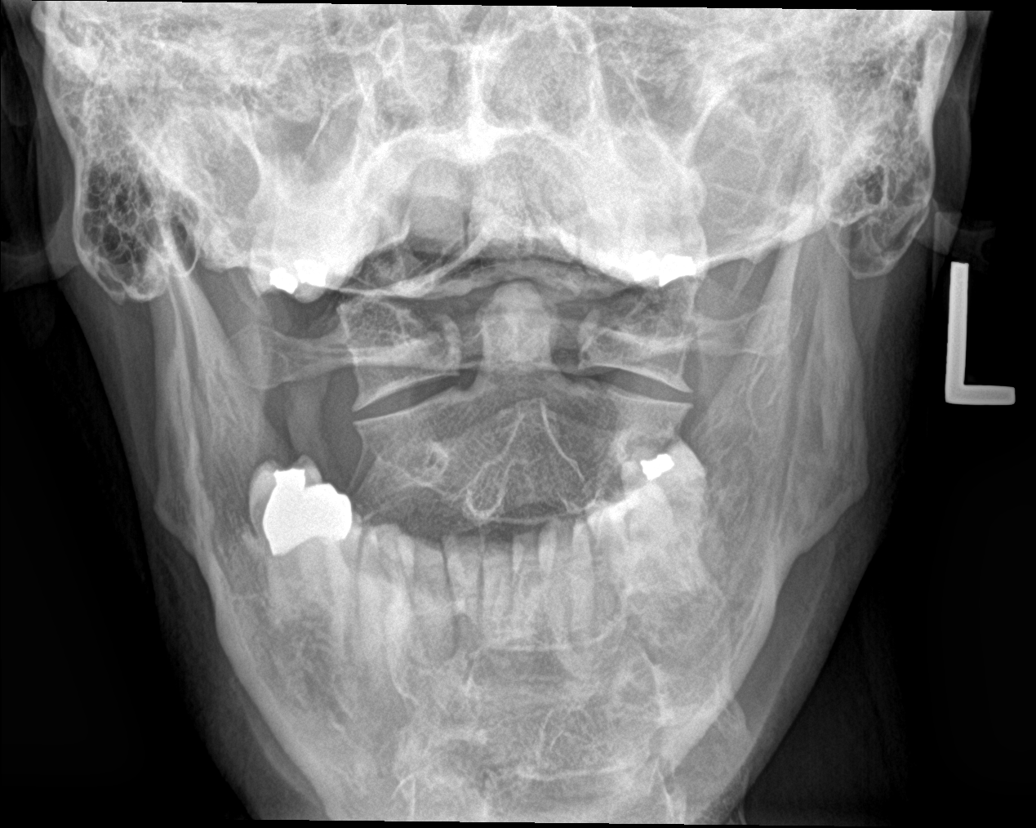

[c-spine swimmers]
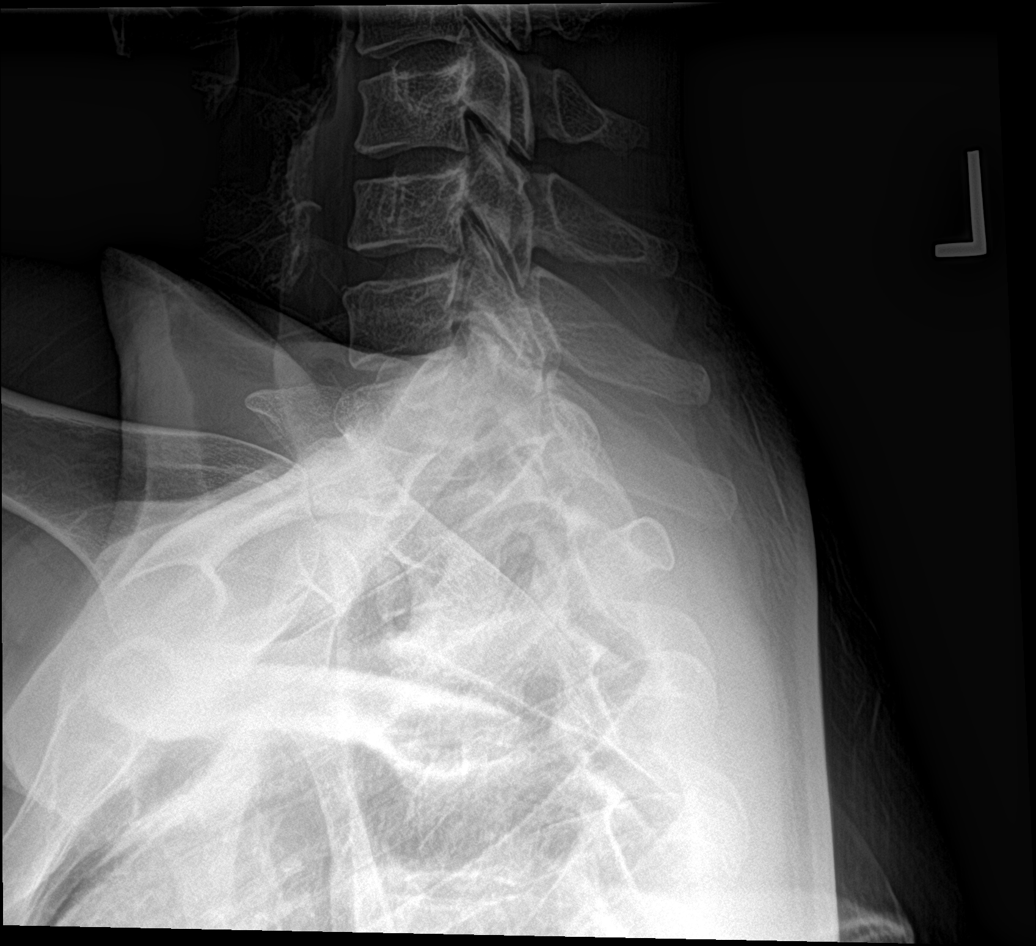

[6 of 6 positions shown; findings below may reference images not displayed]

FINDINGS: Reversal of cervical lordosis question muscle spasm.

Prevertebral soft tissues normal thickness.

Vertebral body and disc space heights maintained.

Bony foramina patent.

Slight lateral cervical flexion to the RIGHT.

No fracture, subluxation, or bone destruction.

Lung apices clear.
IMPRESSION: Question muscle spasm; otherwise normal exam.

## 2021-02-08 ENCOUNTER — Encounter: Payer: Self-pay | Admitting: Neurology

## 2021-02-08 ENCOUNTER — Ambulatory Visit: Payer: BC Managed Care – PPO | Admitting: Neurology

## 2021-02-08 VITALS — BP 132/87 | HR 75 | Ht 71.75 in | Wt 164.4 lb

## 2021-02-08 DIAGNOSIS — G4486 Cervicogenic headache: Secondary | ICD-10-CM

## 2021-02-08 NOTE — Progress Notes (Signed)
Reason for visit: Headache  Referring physician: Dr. Gerre Scull Larry Mercado is a 40 y.o. male  History of present illness:  Larry Mercado is a 40 year old right-handed white male with a history of onset of headaches that began in December 2021.  The patient noted some discomfort in the left neck and lower face into the frontotemporal area.  He may have had some neck stiffness with this.  He was treated with gabapentin with good benefit but he had some problems with side effects with feeling zoned out and staggery and dizzy on the medication.  The patient was taken off of gabapentin switched to amitriptyline taking 25 mg at night which is had a good benefit with his headaches.  MRI of the brain and cervical spine were done, MRI of the brain was normal.  MRI of the cervical spine showed lateral lesions on the right cord at the C4-5 and C5-6 levels, no spinal stenosis or spinal cord compression was noted.  The patient himself reports no injury to the head or neck in the past, no whiplash injuries have occurred.  The patient does have a benign essential tremor, and he does have a labral tear on the right hip.  He reports no numbness or weakness of the face, arms, or legs otherwise.  He denies any balance issues or difficulty controlling the bowels or the bladder.  He is sent to this office for further evaluation.   Past Medical History:  Diagnosis Date  . Allergic rhinitis   . Attention deficit   . Chronic back pain   . Dyslipidemia   . Dyspepsia   . GERD (gastroesophageal reflux disease)   . Groin pain, right lower quadrant   . Hemorrhoids    internal, per patient  . Herniated cervical disc   . IBS (irritable bowel syndrome)   . Sebaceous cyst   . Vertigo     Past Surgical History:  Procedure Laterality Date  . COLONOSCOPY  2012   In [place] he believes but don't know where. Was told that he had internal hemorrhoids  . ESOPHAGOGASTRODUODENOSCOPY  2012   believes it was done same time  with colonoscopy  . HYDROCELE EXCISION / REPAIR    . MOUTH SURGERY    . SPERMATIC VEIN LIGATION      Family History  Problem Relation Age of Onset  . Cancer Father        brain  . Mitral valve prolapse Father   . Heart disease Paternal Grandfather   . Alzheimer's disease Maternal Grandmother   . Mesothelioma Paternal Grandmother   . Prostate cancer Maternal Grandfather   . Colon cancer Neg Hx   . Esophageal cancer Neg Hx     Social history:  reports that he has never smoked. He has never used smokeless tobacco. He reports previous alcohol use. He reports that he does not use drugs.  Medications:  Prior to Admission medications   Medication Sig Start Date End Date Taking? Authorizing Provider  albuterol (VENTOLIN HFA) 108 (90 Base) MCG/ACT inhaler Inhale 2 puffs into the lungs every 6 (six) hours as needed for wheezing or shortness of breath. 08/31/20  Yes Mannam, Praveen, MD  amitriptyline (ELAVIL) 25 MG tablet Take 1 tablet (25 mg total) by mouth at bedtime. 01/22/21  Yes Christen Butter, NP  budesonide-formoterol (SYMBICORT) 160-4.5 MCG/ACT inhaler Inhale 2 puffs into the lungs in the morning and at bedtime. 08/31/20  Yes Mannam, Praveen, MD  ketoconazole (NIZORAL) 2 % cream Apply  topically 2 (two) times daily as needed. To affected area 08/25/20  Yes Sunnie Nielsen, DO  meloxicam (MOBIC) 15 MG tablet One tab PO qAM with a meal for 2 weeks, then daily prn pain. 01/12/21  Yes Monica Becton, MD  pantoprazole (PROTONIX) 40 MG tablet Take 1 tablet (40 mg total) by mouth daily. 01/22/21  Yes Christen Butter, NP  predniSONE (DELTASONE) 50 MG tablet Take 1 tablet (50 mg total) by mouth daily. 01/22/21   Christen Butter, NP      Allergies  Allergen Reactions  . Abilify [Aripiprazole]     Dizziness Patient said that he said he is not allergic to that medication     ROS:  Out of a complete 14 system review of symptoms, the patient complains only of the following symptoms, and all other  reviewed systems are negative.  Headache  Blood pressure 132/87, pulse 75, height 5' 11.75" (1.822 m), weight 164 lb 6.4 oz (74.6 kg).  Physical Exam  General: The patient is alert and cooperative at the time of the examination.  Eyes: Pupils are equal, round, and reactive to light. Discs are flat bilaterally.  Neck: The neck is supple, no carotid bruits are noted.  Respiratory: The respiratory examination is clear.  Cardiovascular: The cardiovascular examination reveals a regular rate and rhythm, no obvious murmurs or rubs are noted.  Skin: Extremities are without significant edema.  Neurologic Exam  Mental status: The patient is alert and oriented x 3 at the time of the examination. The patient has apparent normal recent and remote memory, with an apparently normal attention span and concentration ability.  Cranial nerves: Facial symmetry is present. There is good sensation of the face to pinprick and soft touch bilaterally. The strength of the facial muscles and the muscles to head turning and shoulder shrug are normal bilaterally. Speech is well enunciated, no aphasia or dysarthria is noted. Extraocular movements are full. Visual fields are full. The tongue is midline, and the patient has symmetric elevation of the soft palate. No obvious hearing deficits are noted.  Motor: The motor testing reveals 5 over 5 strength of all 4 extremities. Good symmetric motor tone is noted throughout.  Sensory: Sensory testing is intact to pinprick, soft touch, vibration sensation, and position sense on all 4 extremities. No evidence of extinction is noted.  Coordination: Cerebellar testing reveals good finger-nose-finger and heel-to-shin bilaterally.  A fine action type tremor is noted with finger-nose-finger bilaterally.  Gait and station: Gait is normal. Tandem gait is normal. Romberg is negative. No drift is seen.  Reflexes: Deep tendon reflexes are symmetric and normal bilaterally. Toes are  downgoing bilaterally.   MRI cervical spine 11/30/20:  IMPRESSION: 1. Small foci of signal abnormality in the right aspect of the spinal cord at C4-5 and C5-6, favored to reflect myelomalacia related to a nonspecific remote insult. No cord enlargement or enhancement to suggest an active process. No findings of demyelinating disease on today's brain MRI. 2. Small disc protrusion at C4-5 without stenosis.  * MRI scan images were reviewed online. I agree with the written report.   MRI brain 11/30/20:  IMPRESSION: Unremarkable appearance of the brain.   Assessment/Plan:  1.  Left-sided headache, resolved on amitriptyline  2.  Myelomalacia of the right cervical spinal cord, etiology unclear  The abnormality seen on the MRI of the cervical spine are unclear, they do not appear to be typical for demyelinating disease.  The headaches have resolved with low-dose amitriptyline.  I would  recommend repeat MRI of the brain and cervical spine in 1 year, the patient will need to be seen again if new lesions are noted.  Larry Palau MD 02/08/2021 4:21 PM  [facility] [address] [place], [place] [address]  Phone [phone] Fax [phone]

## 2021-02-12 ENCOUNTER — Institutional Professional Consult (permissible substitution): Payer: Self-pay | Admitting: Neurology

## 2021-02-15 DIAGNOSIS — M25851 Other specified joint disorders, right hip: Secondary | ICD-10-CM | POA: Diagnosis not present

## 2021-02-15 DIAGNOSIS — M25551 Pain in right hip: Secondary | ICD-10-CM | POA: Diagnosis not present

## 2021-02-15 DIAGNOSIS — M25552 Pain in left hip: Secondary | ICD-10-CM | POA: Diagnosis not present

## 2021-02-21 DIAGNOSIS — J40 Bronchitis, not specified as acute or chronic: Secondary | ICD-10-CM | POA: Diagnosis not present

## 2021-03-08 DIAGNOSIS — Z20822 Contact with and (suspected) exposure to covid-19: Secondary | ICD-10-CM | POA: Diagnosis not present

## 2021-03-09 DIAGNOSIS — M659 Synovitis and tenosynovitis, unspecified: Secondary | ICD-10-CM | POA: Diagnosis not present

## 2021-03-09 DIAGNOSIS — G8918 Other acute postprocedural pain: Secondary | ICD-10-CM | POA: Diagnosis not present

## 2021-03-09 DIAGNOSIS — S73101A Unspecified sprain of right hip, initial encounter: Secondary | ICD-10-CM | POA: Diagnosis not present

## 2021-03-09 DIAGNOSIS — M25551 Pain in right hip: Secondary | ICD-10-CM | POA: Diagnosis not present

## 2021-03-09 DIAGNOSIS — M1611 Unilateral primary osteoarthritis, right hip: Secondary | ICD-10-CM | POA: Diagnosis not present

## 2021-03-09 DIAGNOSIS — X58XXXA Exposure to other specified factors, initial encounter: Secondary | ICD-10-CM | POA: Diagnosis not present

## 2021-03-09 DIAGNOSIS — Z471 Aftercare following joint replacement surgery: Secondary | ICD-10-CM | POA: Diagnosis not present

## 2021-03-09 DIAGNOSIS — Z96641 Presence of right artificial hip joint: Secondary | ICD-10-CM | POA: Diagnosis not present

## 2021-03-09 DIAGNOSIS — I9789 Other postprocedural complications and disorders of the circulatory system, not elsewhere classified: Secondary | ICD-10-CM | POA: Diagnosis not present

## 2021-03-09 DIAGNOSIS — M25851 Other specified joint disorders, right hip: Secondary | ICD-10-CM | POA: Diagnosis not present

## 2021-03-15 DIAGNOSIS — Z789 Other specified health status: Secondary | ICD-10-CM | POA: Diagnosis not present

## 2021-03-15 DIAGNOSIS — M25551 Pain in right hip: Secondary | ICD-10-CM | POA: Diagnosis not present

## 2021-03-15 DIAGNOSIS — Z7409 Other reduced mobility: Secondary | ICD-10-CM | POA: Diagnosis not present

## 2021-03-15 DIAGNOSIS — M25851 Other specified joint disorders, right hip: Secondary | ICD-10-CM | POA: Diagnosis not present

## 2021-03-24 DIAGNOSIS — M25551 Pain in right hip: Secondary | ICD-10-CM | POA: Diagnosis not present

## 2021-03-24 DIAGNOSIS — Z7409 Other reduced mobility: Secondary | ICD-10-CM | POA: Diagnosis not present

## 2021-03-24 DIAGNOSIS — M25851 Other specified joint disorders, right hip: Secondary | ICD-10-CM | POA: Diagnosis not present

## 2021-03-24 DIAGNOSIS — Z789 Other specified health status: Secondary | ICD-10-CM | POA: Diagnosis not present

## 2021-03-25 DIAGNOSIS — M25551 Pain in right hip: Secondary | ICD-10-CM | POA: Diagnosis not present

## 2021-03-25 DIAGNOSIS — I9789 Other postprocedural complications and disorders of the circulatory system, not elsewhere classified: Secondary | ICD-10-CM | POA: Diagnosis not present

## 2021-03-25 DIAGNOSIS — Z471 Aftercare following joint replacement surgery: Secondary | ICD-10-CM | POA: Diagnosis not present

## 2021-03-25 DIAGNOSIS — M25851 Other specified joint disorders, right hip: Secondary | ICD-10-CM | POA: Diagnosis not present

## 2021-03-25 DIAGNOSIS — M1611 Unilateral primary osteoarthritis, right hip: Secondary | ICD-10-CM | POA: Diagnosis not present

## 2021-03-31 DIAGNOSIS — M25551 Pain in right hip: Secondary | ICD-10-CM | POA: Diagnosis not present

## 2021-03-31 DIAGNOSIS — M6281 Muscle weakness (generalized): Secondary | ICD-10-CM | POA: Diagnosis not present

## 2021-03-31 DIAGNOSIS — Z7409 Other reduced mobility: Secondary | ICD-10-CM | POA: Diagnosis not present

## 2021-03-31 DIAGNOSIS — M25851 Other specified joint disorders, right hip: Secondary | ICD-10-CM | POA: Diagnosis not present

## 2021-04-06 ENCOUNTER — Institutional Professional Consult (permissible substitution): Payer: Self-pay | Admitting: Neurology

## 2021-04-07 DIAGNOSIS — M6281 Muscle weakness (generalized): Secondary | ICD-10-CM | POA: Diagnosis not present

## 2021-04-07 DIAGNOSIS — M25551 Pain in right hip: Secondary | ICD-10-CM | POA: Diagnosis not present

## 2021-04-07 DIAGNOSIS — Z7409 Other reduced mobility: Secondary | ICD-10-CM | POA: Diagnosis not present

## 2021-04-07 DIAGNOSIS — M25851 Other specified joint disorders, right hip: Secondary | ICD-10-CM | POA: Diagnosis not present

## 2021-04-13 DIAGNOSIS — M25551 Pain in right hip: Secondary | ICD-10-CM | POA: Diagnosis not present

## 2021-04-13 DIAGNOSIS — M25851 Other specified joint disorders, right hip: Secondary | ICD-10-CM | POA: Diagnosis not present

## 2021-04-13 DIAGNOSIS — Z7409 Other reduced mobility: Secondary | ICD-10-CM | POA: Diagnosis not present

## 2021-04-13 DIAGNOSIS — M6281 Muscle weakness (generalized): Secondary | ICD-10-CM | POA: Diagnosis not present

## 2021-04-21 DIAGNOSIS — M25551 Pain in right hip: Secondary | ICD-10-CM | POA: Diagnosis not present

## 2021-04-21 DIAGNOSIS — M6281 Muscle weakness (generalized): Secondary | ICD-10-CM | POA: Diagnosis not present

## 2021-04-21 DIAGNOSIS — M25851 Other specified joint disorders, right hip: Secondary | ICD-10-CM | POA: Diagnosis not present

## 2021-04-21 DIAGNOSIS — Z7409 Other reduced mobility: Secondary | ICD-10-CM | POA: Diagnosis not present

## 2021-04-28 DIAGNOSIS — M25551 Pain in right hip: Secondary | ICD-10-CM | POA: Diagnosis not present

## 2021-04-28 DIAGNOSIS — Z7409 Other reduced mobility: Secondary | ICD-10-CM | POA: Diagnosis not present

## 2021-04-28 DIAGNOSIS — M6281 Muscle weakness (generalized): Secondary | ICD-10-CM | POA: Diagnosis not present

## 2021-04-28 DIAGNOSIS — M25851 Other specified joint disorders, right hip: Secondary | ICD-10-CM | POA: Diagnosis not present

## 2021-05-10 DIAGNOSIS — Z7409 Other reduced mobility: Secondary | ICD-10-CM | POA: Diagnosis not present

## 2021-05-10 DIAGNOSIS — M25551 Pain in right hip: Secondary | ICD-10-CM | POA: Diagnosis not present

## 2021-05-10 DIAGNOSIS — M25851 Other specified joint disorders, right hip: Secondary | ICD-10-CM | POA: Diagnosis not present

## 2021-05-10 DIAGNOSIS — M6281 Muscle weakness (generalized): Secondary | ICD-10-CM | POA: Diagnosis not present

## 2021-05-12 ENCOUNTER — Other Ambulatory Visit: Payer: Self-pay

## 2021-05-12 ENCOUNTER — Encounter: Payer: Self-pay | Admitting: Medical-Surgical

## 2021-05-12 ENCOUNTER — Ambulatory Visit: Payer: BC Managed Care – PPO | Admitting: Medical-Surgical

## 2021-05-12 DIAGNOSIS — R4589 Other symptoms and signs involving emotional state: Secondary | ICD-10-CM

## 2021-05-12 DIAGNOSIS — M533 Sacrococcygeal disorders, not elsewhere classified: Secondary | ICD-10-CM | POA: Diagnosis not present

## 2021-05-12 DIAGNOSIS — L719 Rosacea, unspecified: Secondary | ICD-10-CM | POA: Diagnosis not present

## 2021-05-12 DIAGNOSIS — F418 Other specified anxiety disorders: Secondary | ICD-10-CM

## 2021-05-12 DIAGNOSIS — K219 Gastro-esophageal reflux disease without esophagitis: Secondary | ICD-10-CM | POA: Diagnosis not present

## 2021-05-12 DIAGNOSIS — G4486 Cervicogenic headache: Secondary | ICD-10-CM

## 2021-05-12 MED ORDER — METRONIDAZOLE 0.75 % EX GEL: 1.0000 "application " | Freq: Two times a day (BID) | CUTANEOUS | 3 refills | Status: DC

## 2021-05-12 MED ORDER — AMITRIPTYLINE HCL 10 MG PO TABS: 10.0000 mg | ORAL_TABLET | Freq: Every day | ORAL | 1 refills | Status: DC

## 2021-05-12 MED ORDER — OMEPRAZOLE 40 MG PO CPDR: 40.0000 mg | DELAYED_RELEASE_CAPSULE | Freq: Every day | ORAL | 3 refills | Status: DC

## 2021-05-12 MED ORDER — BUSPIRONE HCL 5 MG PO TABS: 5.0000 mg | ORAL_TABLET | Freq: Two times a day (BID) | ORAL | 1 refills | Status: DC

## 2021-05-12 MED ORDER — MELOXICAM 15 MG PO TABS: 15.0000 mg | ORAL_TABLET | Freq: Every day | ORAL | 0 refills | Status: DC

## 2021-05-12 NOTE — Progress Notes (Signed)
Subjective:    CC: Several issues  HPI: Pleasant 40 year old male presenting today for the following:  Rosacea-he has been dealing with rosacea for many years and using Nizoral cream as prescribed.  Unfortunately, since his hip surgery he has had 2 flares of rosacea and his Nizoral cream is no longer helping.  He has significant redness to the central parts of his face extending up into his forehead is very self-conscious about this.  Since his surgery, he has had significant pressure at his tailbone that comes and goes.  He did speak with his surgeon about this and was told that it is normal.  Feels that its causing some issues with his bowel function and habits.  Notes that his constipation has been a bit worse periodically and his hemorrhoids have been flaring up as well.  He had some blood in his stool today but it was bright red and consistent with hemorrhoid flares.  He is very concerned about the pressure at his tailbone and even though not significantly painful, notes it is more nagging than anything else.  He would like to have a second opinion on what could be causing this.  Has been taking amitriptyline 25 mg nightly which helps greatly with sleep.  He would like to come off of the medication as he does not feel he needs any more.  It does help with sleep but it leaves him feeling very fatigued if he does not take it as an early enough time.  He has tried to stop the medication on his own but had difficulty with getting enough rest when he could not fall asleep.  Has been taking pantoprazole for reflux but unfortunately his insurance has denied this.  He is not sure why but he is interested in another option for managing his GERD.  He does have quite a bit of health-related anxiety and tends to worry about everything.  Admits that he is a bit of a hypochondriac and frequently think there is something.  He is interested in options to help with his overall anxiety.  He did take Abilify in  the past but had quite a bit of weight gain and did not tolerate this medication well.  Notes in the remote history, he was treated with multiple SSRIs but his provider at that time Switching him from 1 to another and he is not sure why.  I reviewed the past medical history, family history, social history, surgical history, and allergies today and no changes were needed.  Please see the problem list section below in epic for further details.  Past Medical History: Past Medical History:  Diagnosis Date   Allergic rhinitis    Attention deficit    Chronic back pain    Dyslipidemia    Dyspepsia    GERD (gastroesophageal reflux disease)    Groin pain, right lower quadrant    Hemorrhoids    internal, per patient   Herniated cervical disc    IBS (irritable bowel syndrome)    Sebaceous cyst    Vertigo    Past Surgical History: Past Surgical History:  Procedure Laterality Date   COLONOSCOPY  2012   In [place] he believes but don't know where. Was told that he had internal hemorrhoids   ESOPHAGOGASTRODUODENOSCOPY  2012   believes it was done same time with colonoscopy   HYDROCELE EXCISION / REPAIR     MOUTH SURGERY     SPERMATIC VEIN LIGATION     Social History: Social History  Socioeconomic History   Marital status: Married    Spouse name: Holiday representative   Number of children: Not on file   Years of education: Not on file   Highest education level: Not on file  Occupational History   Occupation: Full time  Tobacco Use   Smoking status: Never   Smokeless tobacco: Never  Vaping Use   Vaping Use: Never used  Substance and Sexual Activity   Alcohol use: Not Currently    Comment: rarely    Drug use: No   Sexual activity: Yes  Other Topics Concern   Not on file  Social History Narrative   Lives with wife and 3 children   Right Handed   Drinks 3-4 cups caffeine daily   Social Determinants of Health   Financial Resource Strain: Not on file  Food Insecurity: Not on file   Transportation Needs: Not on file  Physical Activity: Not on file  Stress: Not on file  Social Connections: Not on file   Family History: Family History  Problem Relation Age of Onset   Cancer Father        brain   Mitral valve prolapse Father    Heart disease Paternal Grandfather    Alzheimer's disease Maternal Grandmother    Mesothelioma Paternal Grandmother    Prostate cancer Maternal Grandfather    Colon cancer Neg Hx    Esophageal cancer Neg Hx    Allergies: Allergies  Allergen Reactions   Abilify [Aripiprazole]     Dizziness Patient said that he said he is not allergic to that medication    Medications: See med rec.  Review of Systems: See HPI for pertinent positives and negatives.   Objective:    General: Well Developed, well nourished, and in no acute distress.  Neuro: Alert and oriented x3.  HEENT: Normocephalic, atraumatic.  Skin: Warm and dry.  Erythematous patches to the forehead, nasal bridge, bilateral cheeks.  No papules, pustules, or vesicles noted Cardiac: Regular rate and rhythm, no murmurs rubs or gallops, no lower extremity edema.  Respiratory: Clear to auscultation bilaterally. Not using accessory muscles, speaking in full sentences.  Impression and Recommendations:    1. Rosacea Discontinue Nizoral.  Start MetroGel 0.75% twice daily.  2. Coccydynia Unfortunately, I do believe this is related to his recent hip surgery.  This may persist for several months before resolving.  Patient does want a second opinion so I will reach out to cohorts and see who they would recommend since I am not familiar with surgeons in the area.  Restart meloxicam 15 mg daily.  3. Anxiety about health Starting BuSpar 5-10 mg twice daily.  4. Gastroesophageal reflux disease without esophagitis Unclear why pantoprazole was not covered after taking it for so long.  Sending in omeprazole to see if insurance will cover this 1 instead.  5. Cervicogenic headache Advised  patient that he may have returning headaches if he stops amitriptyline.  He would still like to wean so sending in amitriptyline 10 mg tablets.  Advised him to do a slow taper starting with 20 mg nightly for a week, then reducing by 5 mg nightly each week until stopping.  Return in about 4 weeks (around 06/09/2021) for mood follow up. ___________________________________________ Thayer Ohm, DNP, APRN, FNP-BC Primary Care and Sports Medicine [facility] [place]

## 2021-05-17 DIAGNOSIS — M6281 Muscle weakness (generalized): Secondary | ICD-10-CM | POA: Diagnosis not present

## 2021-05-17 DIAGNOSIS — Z7409 Other reduced mobility: Secondary | ICD-10-CM | POA: Diagnosis not present

## 2021-05-17 DIAGNOSIS — M25851 Other specified joint disorders, right hip: Secondary | ICD-10-CM | POA: Diagnosis not present

## 2021-05-17 DIAGNOSIS — M25551 Pain in right hip: Secondary | ICD-10-CM | POA: Diagnosis not present

## 2021-05-21 ENCOUNTER — Encounter: Payer: Self-pay | Admitting: Medical-Surgical

## 2021-05-21 MED ORDER — FLUOXETINE HCL 10 MG PO CAPS: ORAL_CAPSULE | ORAL | 0 refills | Status: DC

## 2021-05-24 DIAGNOSIS — M6281 Muscle weakness (generalized): Secondary | ICD-10-CM | POA: Diagnosis not present

## 2021-05-24 DIAGNOSIS — M25551 Pain in right hip: Secondary | ICD-10-CM | POA: Diagnosis not present

## 2021-05-24 DIAGNOSIS — M25851 Other specified joint disorders, right hip: Secondary | ICD-10-CM | POA: Diagnosis not present

## 2021-05-24 DIAGNOSIS — Z7409 Other reduced mobility: Secondary | ICD-10-CM | POA: Diagnosis not present

## 2021-06-02 ENCOUNTER — Telehealth (INDEPENDENT_AMBULATORY_CARE_PROVIDER_SITE_OTHER): Payer: BC Managed Care – PPO | Admitting: Medical-Surgical

## 2021-06-02 ENCOUNTER — Encounter: Payer: Self-pay | Admitting: Medical-Surgical

## 2021-06-02 DIAGNOSIS — F419 Anxiety disorder, unspecified: Secondary | ICD-10-CM | POA: Diagnosis not present

## 2021-06-02 DIAGNOSIS — M533 Sacrococcygeal disorders, not elsewhere classified: Secondary | ICD-10-CM

## 2021-06-02 DIAGNOSIS — L719 Rosacea, unspecified: Secondary | ICD-10-CM

## 2021-06-02 DIAGNOSIS — K219 Gastro-esophageal reflux disease without esophagitis: Secondary | ICD-10-CM

## 2021-06-02 DIAGNOSIS — G4486 Cervicogenic headache: Secondary | ICD-10-CM

## 2021-06-02 DIAGNOSIS — F418 Other specified anxiety disorders: Secondary | ICD-10-CM | POA: Diagnosis not present

## 2021-06-02 DIAGNOSIS — F32A Depression, unspecified: Secondary | ICD-10-CM

## 2021-06-02 NOTE — Progress Notes (Signed)
Virtual Visit via Telephone   I connected with  Larry Mercado  on 06/02/21 by telephone/telehealth and verified that I am speaking with the correct person using two identifiers.   I discussed the limitations, risks, security and privacy concerns of performing an evaluation and management service by telephone, including the higher likelihood of inaccurate diagnosis and treatment, and the availability of in person appointments.  We also discussed the likely need of an additional face to face encounter for complete and high quality delivery of care.  I also discussed with the patient that there may be a patient responsible charge related to this service. The patient expressed understanding and wishes to proceed.  Provider location is in medical facility. Patient location is at their home, different from provider location. People involved in care of the patient during this telehealth encounter were myself, my nurse/medical assistant, and my front office/scheduling team member.  CC: Medication changes follow  HPI: Very pleasant 40 year old male presenting today via telephone for follow-up on recent medication changes.  He is at work and unable to connect via video visit.  Anxiety-approximately 4 weeks ago, was prescribed fluoxetine 10 mg daily x1 week with an increase to 20 mg daily thereafter.  Unfortunately, he did not get this started until this past weekend.  He was worried about possible side effects while at work and wanted to make sure he started it at a time when he would not have any kind of issues with his job duties.  So far, he is doing well on fluoxetine 10 mg daily.  He has had no side effects.  He is not taking the BuSpar that we prescribed twice daily as needed.  Coccydynia-continues to have some issues with coccydynia.  He notes that he will get better for a few days and then return.  He has had some changes in location lately and it does not seem to hurt in the same spot every day.  He  does have some difficulty when his stools are firmer and feels like he has to strain.  He notes that walking does help.  He is taking the meloxicam 15 mg daily as prescribed.  Rosacea-switched from Nizoral cream to MetroGel.  Notes that after few days, his rosacea cleared up pretty well and the new gel seems to be working.  GERD-previously treated with pantoprazole but his insurance denied to cover.  Switch to omeprazole.  He is doing well on this and thinks this may actually provide better reflux management than the pantoprazole did.  Cervicogenic headaches-has done well weaning down to 10 mg nightly of the amitriptyline.  Has not had any recurrence of headaches so far.  We will be reducing to 5 mg nightly over the weekend and then stopping after 1 week.  Review of Systems: See HPI for pertinent positives and negatives.   Objective Findings:    General: Speaking full sentences, no audible heavy breathing.  Sounds alert and appropriately interactive.    Impression and Recommendations:    1. Anxiety and depression 2. Anxiety about health Continue fluoxetine 10 mg daily then increase on Saturday to 20 mg daily as instructed.  Okay to use BuSpar as needed if desired.  3. Rosacea Continue MetroGel twice daily as prescribed  4. Coccydynia Continue meloxicam 15 mg daily.  Given that this is somewhat transient and does have alterations in location as well as intensity, this may be more of a waiting process as he heals further from his surgery.  Monitor symptoms and  if they worsen, return for further evaluation.  5. Gastroesophageal reflux disease without esophagitis Continue omeprazole as prescribed.  6. Cervicogenic headache Continue with plan to wean amitriptyline down to 5 mg over the weekend and then off after 1 more week.  If headaches recur, may need to either restart the medication or evaluate for other options.  I discussed the above assessment and treatment plan with the patient.  The patient was provided an opportunity to ask questions and all were answered. The patient agreed with the plan and demonstrated an understanding of the instructions.   The patient was advised to call back or seek an in-person evaluation if the symptoms worsen or if the condition fails to improve as anticipated.  15 minutes of non-face-to-face time was provided during this encounter.  Return in about 3 weeks (around 06/23/2021) for mood follow up. ___________________________________________ Christen Butter, DNP, APRN, FNP-BC Primary Care and Sports Medicine [facility]

## 2021-06-17 DIAGNOSIS — M25851 Other specified joint disorders, right hip: Secondary | ICD-10-CM | POA: Diagnosis not present

## 2021-07-16 ENCOUNTER — Ambulatory Visit (INDEPENDENT_AMBULATORY_CARE_PROVIDER_SITE_OTHER): Payer: BC Managed Care – PPO | Admitting: Medical-Surgical

## 2021-07-16 ENCOUNTER — Encounter: Payer: Self-pay | Admitting: Medical-Surgical

## 2021-07-16 VITALS — BP 120/77 | HR 82 | Resp 20 | Ht 71.75 in | Wt 170.0 lb

## 2021-07-16 DIAGNOSIS — M25551 Pain in right hip: Secondary | ICD-10-CM

## 2021-07-16 DIAGNOSIS — F419 Anxiety disorder, unspecified: Secondary | ICD-10-CM | POA: Diagnosis not present

## 2021-07-16 DIAGNOSIS — G8929 Other chronic pain: Secondary | ICD-10-CM

## 2021-07-16 DIAGNOSIS — R0789 Other chest pain: Secondary | ICD-10-CM

## 2021-07-16 DIAGNOSIS — F418 Other specified anxiety disorders: Secondary | ICD-10-CM

## 2021-07-16 DIAGNOSIS — L719 Rosacea, unspecified: Secondary | ICD-10-CM | POA: Diagnosis not present

## 2021-07-16 DIAGNOSIS — F32A Depression, unspecified: Secondary | ICD-10-CM

## 2021-07-16 DIAGNOSIS — M545 Low back pain, unspecified: Secondary | ICD-10-CM

## 2021-07-16 MED ORDER — MELOXICAM 15 MG PO TABS: 15.0000 mg | ORAL_TABLET | Freq: Every day | ORAL | 0 refills | Status: DC

## 2021-07-16 MED ORDER — PREGABALIN 50 MG PO CAPS: ORAL_CAPSULE | ORAL | 1 refills | Status: DC

## 2021-07-16 MED ORDER — PREGABALIN 50 MG PO CAPS: 50.0000 mg | ORAL_CAPSULE | Freq: Two times a day (BID) | ORAL | 1 refills | Status: DC

## 2021-07-16 MED ORDER — FLUOXETINE HCL 40 MG PO CAPS
40.0000 mg | ORAL_CAPSULE | Freq: Every day | ORAL | 1 refills | Status: DC
Start: 2021-07-16 — End: 2021-07-19

## 2021-07-16 NOTE — Progress Notes (Signed)
HPI with pertinent ROS:   CC: Side/back pain, rosacea, mood  HPI: Pleasant 40 year old male presenting for the following:  Side/back pain-recent history with costochondritis that had almost fully resolved but unfortunately his symptoms have returned over the past few weeks.  Now his symptoms appear to be more mobile than previously and involve his upper chest, lower ribs, side ribs and flank, low back, and abdomen.  Notes that his pain is reproducible when he leans on something for more than a few minutes or lies in a certain position at night that puts pressure on the area involved.  He has not been taking meloxicam but is open to restarting it.  Rosacea-has been battling a rosacea flare since his surgery.  He was previously using ketoconazole cream but was switched to metronidazole gel when his initial cream stopped working after several years.  He is using the metronidazole gel once daily and notes that it leaves his face significantly dry.  It does help some with the redness but does not completely cleared up.  Mood-taking fluoxetine 20 mg daily as prescribed, tolerating well without side effects.  Does not feel like the medication has helped at all.  Is still very concerned over his health.  Notes that he would like to be able to come off all medications and find definitive answers in cures for the things that are bothering him.  I reviewed the past medical history, family history, social history, surgical history, and allergies today and no changes were needed.  Please see the problem list section below in epic for further details.   Physical exam:   General: Well Developed, well nourished, and in no acute distress.  Neuro: Alert and oriented x3.  HEENT: Normocephalic, atraumatic.  Skin: Warm and dry.  Residual erythema noted to the cheeks, bridge of the nose, and forehead.  Dry scaly skin noted in these areas as well as along the hairline and scattered patches. Cardiac: Regular rate  and rhythm, no murmurs rubs or gallops, no lower extremity edema.  Respiratory: Clear to auscultation bilaterally. Not using accessory muscles, speaking in full sentences. MSK: Unable to reproduce pains described above with pressure over the ribs and back musculature.  Full range of motion and no limitation in movements noted while in office.  Impression and Recommendations:    1. Rosacea Recommend switching back to ketoconazole 2% cream now that the bulk of his flare has resolved.  If this is not helpful, recommend reaching out via MyChart or phone and we will look at other options for treatment.  I agree that these flares are likely a result of the stress his body went under for surgery and the associated recovery period.  2. Anterior chest wall pain 3. Chronic bilateral low back pain without sciatica 4. Right hip pain Unclear etiology.  Symptoms are not currently consistent with costochondritis as we are not able to reproduce them in office.  Considerable issues with chronic pains that previously responded to gabapentin but concerns about medication effects worsening his tremor led to discontinuation of that.  Since there has been no improvement in the tremor and gabapentin left him tired, we will start with Lyrica 50 mg nightly for 7 days then increase to 50 mg twice daily.  Referring to chiropractic medicine at patient request. - Ambulatory referral to Chiropractic  5. Anxiety and depression 6. Anxiety about health Increasing fluoxetine to 40 mg daily.  Discussed expectations of health care and the aging process as issues are likely to arise  with little to no warning and definitive answers with 100% cures are often scarce.  Return in about 4 weeks (around 08/13/2021) for Myalgias/mood follow up. ___________________________________________ Thayer Ohm, DNP, APRN, FNP-BC Primary Care and Sports Medicine [facility] [place]

## 2021-07-18 ENCOUNTER — Encounter: Payer: Self-pay | Admitting: Medical-Surgical

## 2021-07-19 MED ORDER — FLUOXETINE HCL 20 MG PO CAPS: 20.0000 mg | ORAL_CAPSULE | Freq: Every day | ORAL | 3 refills | Status: DC

## 2021-07-27 DIAGNOSIS — M545 Low back pain, unspecified: Secondary | ICD-10-CM | POA: Diagnosis not present

## 2021-07-27 DIAGNOSIS — M9902 Segmental and somatic dysfunction of thoracic region: Secondary | ICD-10-CM | POA: Diagnosis not present

## 2021-07-27 DIAGNOSIS — M9901 Segmental and somatic dysfunction of cervical region: Secondary | ICD-10-CM | POA: Diagnosis not present

## 2021-07-27 DIAGNOSIS — M9903 Segmental and somatic dysfunction of lumbar region: Secondary | ICD-10-CM | POA: Diagnosis not present

## 2021-07-27 MED ORDER — OMEPRAZOLE 40 MG PO CPDR: 40.0000 mg | DELAYED_RELEASE_CAPSULE | Freq: Every day | ORAL | 0 refills | Status: DC

## 2021-07-27 NOTE — Addendum Note (Signed)
Addended by: Stan Head on: 07/27/2021 01:06 PM   Modules accepted: Orders

## 2021-07-29 DIAGNOSIS — M545 Low back pain, unspecified: Secondary | ICD-10-CM | POA: Diagnosis not present

## 2021-07-29 DIAGNOSIS — M9903 Segmental and somatic dysfunction of lumbar region: Secondary | ICD-10-CM | POA: Diagnosis not present

## 2021-07-29 DIAGNOSIS — M9902 Segmental and somatic dysfunction of thoracic region: Secondary | ICD-10-CM | POA: Diagnosis not present

## 2021-07-29 DIAGNOSIS — M9901 Segmental and somatic dysfunction of cervical region: Secondary | ICD-10-CM | POA: Diagnosis not present

## 2021-08-10 DIAGNOSIS — M9902 Segmental and somatic dysfunction of thoracic region: Secondary | ICD-10-CM | POA: Diagnosis not present

## 2021-08-10 DIAGNOSIS — M545 Low back pain, unspecified: Secondary | ICD-10-CM | POA: Diagnosis not present

## 2021-08-10 DIAGNOSIS — M9903 Segmental and somatic dysfunction of lumbar region: Secondary | ICD-10-CM | POA: Diagnosis not present

## 2021-08-10 DIAGNOSIS — M9901 Segmental and somatic dysfunction of cervical region: Secondary | ICD-10-CM | POA: Diagnosis not present

## 2021-08-12 DIAGNOSIS — M9902 Segmental and somatic dysfunction of thoracic region: Secondary | ICD-10-CM | POA: Diagnosis not present

## 2021-08-12 DIAGNOSIS — M9901 Segmental and somatic dysfunction of cervical region: Secondary | ICD-10-CM | POA: Diagnosis not present

## 2021-08-12 DIAGNOSIS — M545 Low back pain, unspecified: Secondary | ICD-10-CM | POA: Diagnosis not present

## 2021-08-12 DIAGNOSIS — M9903 Segmental and somatic dysfunction of lumbar region: Secondary | ICD-10-CM | POA: Diagnosis not present

## 2021-08-12 DIAGNOSIS — M546 Pain in thoracic spine: Secondary | ICD-10-CM | POA: Diagnosis not present

## 2021-08-16 DIAGNOSIS — M25851 Other specified joint disorders, right hip: Secondary | ICD-10-CM | POA: Diagnosis not present

## 2021-08-17 ENCOUNTER — Encounter: Payer: Self-pay | Admitting: Medical-Surgical

## 2021-08-17 ENCOUNTER — Telehealth (INDEPENDENT_AMBULATORY_CARE_PROVIDER_SITE_OTHER): Payer: BC Managed Care – PPO | Admitting: Medical-Surgical

## 2021-08-17 DIAGNOSIS — K219 Gastro-esophageal reflux disease without esophagitis: Secondary | ICD-10-CM | POA: Diagnosis not present

## 2021-08-17 DIAGNOSIS — M9901 Segmental and somatic dysfunction of cervical region: Secondary | ICD-10-CM | POA: Diagnosis not present

## 2021-08-17 DIAGNOSIS — M9902 Segmental and somatic dysfunction of thoracic region: Secondary | ICD-10-CM | POA: Diagnosis not present

## 2021-08-17 DIAGNOSIS — F32A Depression, unspecified: Secondary | ICD-10-CM | POA: Diagnosis not present

## 2021-08-17 DIAGNOSIS — F419 Anxiety disorder, unspecified: Secondary | ICD-10-CM | POA: Diagnosis not present

## 2021-08-17 DIAGNOSIS — M791 Myalgia, unspecified site: Secondary | ICD-10-CM

## 2021-08-17 DIAGNOSIS — M545 Low back pain, unspecified: Secondary | ICD-10-CM | POA: Diagnosis not present

## 2021-08-17 DIAGNOSIS — M9903 Segmental and somatic dysfunction of lumbar region: Secondary | ICD-10-CM | POA: Diagnosis not present

## 2021-08-17 MED ORDER — OMEPRAZOLE 40 MG PO CPDR: 40.0000 mg | DELAYED_RELEASE_CAPSULE | Freq: Every day | ORAL | 1 refills | Status: DC

## 2021-08-17 MED ORDER — PREGABALIN 75 MG PO CAPS
ORAL_CAPSULE | ORAL | 0 refills | Status: DC
Start: 2021-08-17 — End: 2021-09-27

## 2021-08-17 NOTE — Progress Notes (Signed)
Feels like least effective medication he's tired

## 2021-08-17 NOTE — Progress Notes (Signed)
Virtual Visit via Video Note  I connected with Larry Mercado on 08/17/21 at  2:00 PM EDT by a video enabled telemedicine application and verified that I am speaking with the correct person using two identifiers.   I discussed the limitations of evaluation and management by telemedicine and the availability of in person appointments. The patient expressed understanding and agreed to proceed.  Patient location: home Provider locations: office  Subjective:    CC: pain follow up  HPI: Pleasant 40 year old male presenting via MyChart video visit for follow up since starting Lyrica 50mg  BID. Taking the medication as prescribed but notes that this medication is the least effective of the ones he has tried. Describes his pain as mostly affecting his right side but that the pain moves to different locations including his back, ribs, and side. Pain is inconsistent and unpredictable. Wakes up at night when he rolls over on it because pressure on the area hurts. Saw his surgeon yesterday and discussed his pain with him. His surgeon suggested an increased dose of Lyrica. Also suggested using topicals. Patient already using [facility] without relief but has not tried other topical medications.   Has been trying to get insurance to cover Prilosec and reports that the only way they will cover it is if it is sent in for a 90 day supply to [facility]. Would like to have this sent over if possible.   Mood- feels that the fluoxetine is helping with his anxiety although he does still worry about his pain and what may be causing it.   Past medical history, Surgical history, Family history not pertinant except as noted below, Social history, Allergies, and medications have been entered into the medical record, reviewed, and corrections made.   Review of Systems: See HPI for pertinent positives and negatives.   Depression screen [facility] 2/9 08/17/2021 06/02/2021 06/04/2019 05/07/2019 04/05/2019  Decreased Interest 0 0 0 0 0  Down,  Depressed, Hopeless 0 0 0 0 0  PHQ - 2 Score 0 0 0 0 0  Altered sleeping 2 0 - - -  Tired, decreased energy 2 0 - - -  Change in appetite 0 0 - - -  Feeling bad or failure about yourself  0 0 - - -  Trouble concentrating 0 1 - - -  Moving slowly or fidgety/restless 0 0 - - -  Suicidal thoughts 0 0 - - -  PHQ-9 Score 4 1 - - -  Difficult doing work/chores Somewhat difficult Not difficult at all - - -   GAD 7 : Generalized Anxiety Score 08/17/2021 06/02/2021 06/04/2019 05/07/2019  Nervous, Anxious, on Edge 0 1 0 0  Control/stop worrying 0 1 0 0  Worry too much - different things 0 1 0 0  Trouble relaxing 1 1 1 1   Restless 0 0 1 0  Easily annoyed or irritable 1 0 1 1  Afraid - awful might happen 0 0 0 0  Total GAD 7 Score 2 4 3 2   Anxiety Difficulty Not difficult at all Somewhat difficult Not difficult at all Not difficult at all     Objective:    General: Speaking clearly in complete sentences without any shortness of breath.  Alert and oriented x3.  Normal judgment. No apparent acute distress.  Impression and Recommendations:    1. Gastroesophageal reflux disease without esophagitis Sending a 90 day supply of [facility] per patient request.   2. Anxiety and depression Continue Fluoxetine.   3. Myalgia  Increase Lyrica to 75mg  BID. If no improvement after 2 weeks, increase to 75mg  TID. Recommend Voltaren gel 4 times daily as needed to the affected area.   I discussed the assessment and treatment plan with the patient. The patient was provided an opportunity to ask questions and all were answered. The patient agreed with the plan and demonstrated an understanding of the instructions.   The patient was advised to call back or seek an in-person evaluation if the symptoms worsen or if the condition fails to improve as anticipated.  25 minutes of non-face-to-face time was provided during this encounter.  Return for pain follow up in 4-6 weeks.  , DNP, APRN,  FNP-BC [facility] and Sports Medicine

## 2021-08-19 DIAGNOSIS — M9902 Segmental and somatic dysfunction of thoracic region: Secondary | ICD-10-CM | POA: Diagnosis not present

## 2021-08-19 DIAGNOSIS — M546 Pain in thoracic spine: Secondary | ICD-10-CM | POA: Diagnosis not present

## 2021-08-19 DIAGNOSIS — M9903 Segmental and somatic dysfunction of lumbar region: Secondary | ICD-10-CM | POA: Diagnosis not present

## 2021-08-19 DIAGNOSIS — M9901 Segmental and somatic dysfunction of cervical region: Secondary | ICD-10-CM | POA: Diagnosis not present

## 2021-08-19 DIAGNOSIS — M545 Low back pain, unspecified: Secondary | ICD-10-CM | POA: Diagnosis not present

## 2021-08-23 ENCOUNTER — Ambulatory Visit: Payer: BC Managed Care – PPO | Admitting: Family Medicine

## 2021-08-24 ENCOUNTER — Ambulatory Visit (INDEPENDENT_AMBULATORY_CARE_PROVIDER_SITE_OTHER): Payer: BC Managed Care – PPO | Admitting: Family Medicine

## 2021-08-24 ENCOUNTER — Encounter: Payer: Self-pay | Admitting: Family Medicine

## 2021-08-24 ENCOUNTER — Other Ambulatory Visit: Payer: Self-pay

## 2021-08-24 VITALS — BP 114/58 | HR 65 | Ht 72.0 in | Wt 172.0 lb

## 2021-08-24 DIAGNOSIS — L03012 Cellulitis of left finger: Secondary | ICD-10-CM

## 2021-08-24 DIAGNOSIS — M545 Low back pain, unspecified: Secondary | ICD-10-CM | POA: Diagnosis not present

## 2021-08-24 DIAGNOSIS — M9902 Segmental and somatic dysfunction of thoracic region: Secondary | ICD-10-CM | POA: Diagnosis not present

## 2021-08-24 DIAGNOSIS — M9901 Segmental and somatic dysfunction of cervical region: Secondary | ICD-10-CM | POA: Diagnosis not present

## 2021-08-24 DIAGNOSIS — M9903 Segmental and somatic dysfunction of lumbar region: Secondary | ICD-10-CM | POA: Diagnosis not present

## 2021-08-24 MED ORDER — SULFAMETHOXAZOLE-TRIMETHOPRIM 800-160 MG PO TABS: 1.0000 | ORAL_TABLET | Freq: Two times a day (BID) | ORAL | 0 refills | Status: DC

## 2021-08-24 NOTE — Patient Instructions (Signed)
Keep covered until tomorrow.  If you need to loosen the bandage or change it because of bleeding then that is fine.  Then tomorrow okay to wash.  But do not scrub at the area.  Do not apply alcohol or peroxide or iodine products.  Pat dry, apply a small dab of Vaseline and then apply a loosefitting Band-Aid.  Please keep covered for about 5 days after that okay to keep uncovered.

## 2021-08-24 NOTE — Progress Notes (Signed)
Acute Office Visit  Subjective:    Patient ID: Larry Mercado, male    DOB: [birth year], 40 y.o.   MRN: [id number]  Chief Complaint  Patient presents with   Hand Pain    HPI Patient is in today for swelling of pain of index finger on left hand.  He noticed along the side of the nailbed that it was starting to feel tender and sore over the weekend.  He then noticed it started to actually look yellow and he said it being painful became more numb and tingly.  He has not noticed any active drainage from the wound.  Past Medical History:  Diagnosis Date   Allergic rhinitis    Attention deficit    Chronic back pain    Dyslipidemia    Dyspepsia    GERD (gastroesophageal reflux disease)    Groin pain, right lower quadrant    Hemorrhoids    internal, per patient   Herniated cervical disc    IBS (irritable bowel syndrome)    Sebaceous cyst    Vertigo     Past Surgical History:  Procedure Laterality Date   COLONOSCOPY  2012   In [place] he believes but don't know where. Was told that he had internal hemorrhoids   ESOPHAGOGASTRODUODENOSCOPY  2012   believes it was done same time with colonoscopy   HYDROCELE EXCISION / REPAIR     MOUTH SURGERY     SPERMATIC VEIN LIGATION      Family History  Problem Relation Age of Onset   Cancer Father        brain   Mitral valve prolapse Father    Heart disease Paternal Grandfather    Alzheimer's disease Maternal Grandmother    Mesothelioma Paternal Grandmother    Prostate cancer Maternal Grandfather    Colon cancer Neg Hx    Esophageal cancer Neg Hx     Social History   Socioeconomic History   Marital status: Married    Spouse name: Holiday representative   Number of children: Not on file   Years of education: Not on file   Highest education level: Not on file  Occupational History   Occupation: Full time  Tobacco Use   Smoking status: Never   Smokeless tobacco: Never  Vaping Use   Vaping Use: Never used  Substance and Sexual  Activity   Alcohol use: Not Currently    Comment: rarely    Drug use: No   Sexual activity: Yes  Other Topics Concern   Not on file  Social History Narrative   Lives with wife and 3 children   Right Handed   Drinks 3-4 cups caffeine daily   Social Determinants of Health   Financial Resource Strain: Not on file  Food Insecurity: Not on file  Transportation Needs: Not on file  Physical Activity: Not on file  Stress: Not on file  Social Connections: Not on file  Intimate Partner Violence: Not on file    Outpatient Medications Prior to Visit  Medication Sig Dispense Refill   albuterol (VENTOLIN HFA) 108 (90 Base) MCG/ACT inhaler Inhale 2 puffs into the lungs every 6 (six) hours as needed for wheezing or shortness of breath. 8 g 6   budesonide-formoterol (SYMBICORT) 160-4.5 MCG/ACT inhaler Inhale 2 puffs into the lungs in the morning and at bedtime. 3 each 6   FLUoxetine (PROZAC) 20 MG capsule Take 1 capsule (20 mg total) by mouth daily. 90 capsule 3   meloxicam (MOBIC) 15 MG tablet Take  1 tablet (15 mg total) by mouth daily. 30 tablet 0   metroNIDAZOLE (METROGEL) 0.75 % gel Apply 1 application topically 2 (two) times daily. 45 g 3   omeprazole (PRILOSEC) 40 MG capsule Take 1 capsule (40 mg total) by mouth daily. 90 capsule 1   pregabalin (LYRICA) 75 MG capsule Take 1 capsule (75mg ) twice daily. If no improvement in symptoms after 2 weeks, increase to 1 capsule (75mg ) three times daily. 76 capsule 0   busPIRone (BUSPAR) 5 MG tablet Take 1-2 tablets (5-10 mg total) by mouth 2 (two) times daily. (Patient not taking: No sig reported) 120 tablet 1   No facility-administered medications prior to visit.    Allergies  Allergen Reactions   Abilify [Aripiprazole]     Dizziness Patient said that he said he is not allergic to that medication     Review of Systems     Objective:    Physical Exam  BP (!) 114/58   Pulse 65   Ht 6' (1.829 m)   Wt 172 lb (78 kg)   SpO2 100%   BMI  23.33 kg/m  Wt Readings from Last 3 Encounters:  08/24/21 172 lb (78 kg)  07/16/21 170 lb (77.1 kg)  02/08/21 164 lb 6.4 oz (74.6 kg)    Health Maintenance Due  Topic Date Due   Pneumococcal Vaccine 3-52 Years old (1 - PCV) Never done   HIV Screening  Never done   Hepatitis C Screening  Never done   COVID-19 Vaccine (4 - Booster for Moderna series) 12/27/2020    There are no preventive care reminders to display for this patient.   Lab Results  Component Value Date   TSH 0.73 11/07/2018   Lab Results  Component Value Date   WBC 5.6 12/24/2020   HGB 16.0 12/24/2020   HCT 46.6 12/24/2020   MCV 89.8 12/24/2020   PLT 192 12/24/2020   Lab Results  Component Value Date   NA 140 12/24/2020   K 4.4 12/24/2020   CO2 31 12/24/2020   GLUCOSE 91 12/24/2020   BUN 14 12/24/2020   CREATININE 1.00 12/24/2020   BILITOT 0.6 12/24/2020   ALKPHOS 76 06/03/2017   AST 21 12/24/2020   ALT 26 12/24/2020   PROT 7.1 12/24/2020   ALBUMIN 4.2 06/03/2017   CALCIUM 9.5 12/24/2020   Lab Results  Component Value Date   CHOL 146 12/24/2020   Lab Results  Component Value Date   HDL 43 12/24/2020   Lab Results  Component Value Date   LDLCALC 87 12/24/2020   Lab Results  Component Value Date   TRIG 75 12/24/2020   Lab Results  Component Value Date   CHOLHDL 3.4 12/24/2020   No results found for: HGBA1C     Assessment & Plan:   Problem List Items Addressed This Visit   None Visit Diagnoses     Paronychia of finger of left hand    -  Primary   Relevant Medications   sulfamethoxazole-trimethoprim (BACTRIM DS) 800-160 MG tablet      Paronychia-I&D recommended.  Patient tolerated well.  See procedure below.  Also can place him on antibiotics for a couple of days.  Follow-up wound care discussed.  If not improving then please let 12/26/2020 know.  Incision and Drainage Procedure Note  Pre-operative Diagnosis: paronychia  Post-operative Diagnosis: same  Indications: pain and  swelling  Anesthesia: 1% plain lidocaine  Procedure Details  The procedure, risks and complications have been discussed in detail (  including, but not limited to airway compromise, infection, bleeding) with the patient, and the patient has signed consent to the procedure.  The skin was sterilely prepped and draped over the affected area in the usual fashion. After adequate local anesthesia, I&D with a #11 blade was performed on the left index finger.  . Purulent drainage: present The patient was observed until stable.  Findings: pus  EBL: trace  Drains: none  Condition: Tolerated procedure well   Complications: none.  Keep covered until tomorrow.  If you need to loosen the bandage or change it because of bleeding then that is fine.  Then tomorrow okay to wash.  But do not scrub at the area.  Do not apply alcohol or peroxide or iodine products.  Pat dry, apply a small dab of Vaseline and then apply a loosefitting Band-Aid.  Please keep covered for about 5 days after that okay to keep uncovered.  Meds ordered this encounter  Medications   sulfamethoxazole-trimethoprim (BACTRIM DS) 800-160 MG tablet    Sig: Take 1 tablet by mouth 2 (two) times daily.    Dispense:  10 tablet    Refill:  0      Nani Gasser, MD

## 2021-08-26 DIAGNOSIS — M9903 Segmental and somatic dysfunction of lumbar region: Secondary | ICD-10-CM | POA: Diagnosis not present

## 2021-08-26 DIAGNOSIS — M545 Low back pain, unspecified: Secondary | ICD-10-CM | POA: Diagnosis not present

## 2021-08-26 DIAGNOSIS — M9902 Segmental and somatic dysfunction of thoracic region: Secondary | ICD-10-CM | POA: Diagnosis not present

## 2021-08-26 DIAGNOSIS — M542 Cervicalgia: Secondary | ICD-10-CM | POA: Diagnosis not present

## 2021-08-26 DIAGNOSIS — M9901 Segmental and somatic dysfunction of cervical region: Secondary | ICD-10-CM | POA: Diagnosis not present

## 2021-08-30 MED ORDER — SERTRALINE HCL 50 MG PO TABS: 50.0000 mg | ORAL_TABLET | Freq: Every day | ORAL | 3 refills | Status: DC

## 2021-08-30 NOTE — Addendum Note (Signed)
Addended byChristen Butter on: 08/30/2021 05:29 PM   Modules accepted: Orders

## 2021-08-31 DIAGNOSIS — M9903 Segmental and somatic dysfunction of lumbar region: Secondary | ICD-10-CM | POA: Diagnosis not present

## 2021-08-31 DIAGNOSIS — M545 Low back pain, unspecified: Secondary | ICD-10-CM | POA: Diagnosis not present

## 2021-08-31 DIAGNOSIS — M9902 Segmental and somatic dysfunction of thoracic region: Secondary | ICD-10-CM | POA: Diagnosis not present

## 2021-08-31 DIAGNOSIS — M9901 Segmental and somatic dysfunction of cervical region: Secondary | ICD-10-CM | POA: Diagnosis not present

## 2021-09-07 DIAGNOSIS — M546 Pain in thoracic spine: Secondary | ICD-10-CM | POA: Diagnosis not present

## 2021-09-07 DIAGNOSIS — M545 Low back pain, unspecified: Secondary | ICD-10-CM | POA: Diagnosis not present

## 2021-09-07 DIAGNOSIS — M9903 Segmental and somatic dysfunction of lumbar region: Secondary | ICD-10-CM | POA: Diagnosis not present

## 2021-09-07 DIAGNOSIS — M9901 Segmental and somatic dysfunction of cervical region: Secondary | ICD-10-CM | POA: Diagnosis not present

## 2021-09-07 DIAGNOSIS — M9902 Segmental and somatic dysfunction of thoracic region: Secondary | ICD-10-CM | POA: Diagnosis not present

## 2021-09-09 DIAGNOSIS — M9902 Segmental and somatic dysfunction of thoracic region: Secondary | ICD-10-CM | POA: Diagnosis not present

## 2021-09-09 DIAGNOSIS — M545 Low back pain, unspecified: Secondary | ICD-10-CM | POA: Diagnosis not present

## 2021-09-09 DIAGNOSIS — M9903 Segmental and somatic dysfunction of lumbar region: Secondary | ICD-10-CM | POA: Diagnosis not present

## 2021-09-09 DIAGNOSIS — M9901 Segmental and somatic dysfunction of cervical region: Secondary | ICD-10-CM | POA: Diagnosis not present

## 2021-09-09 DIAGNOSIS — M542 Cervicalgia: Secondary | ICD-10-CM | POA: Diagnosis not present

## 2021-09-22 ENCOUNTER — Other Ambulatory Visit: Payer: Self-pay | Admitting: Pulmonary Disease

## 2021-09-25 ENCOUNTER — Other Ambulatory Visit: Payer: Self-pay | Admitting: Medical-Surgical

## 2021-09-27 NOTE — Telephone Encounter (Signed)
Refill for twice daily dosing sent for a 30 day supply. Patient due to follow up after dose change to evaluate response and discuss his dose frequency.

## 2021-09-27 NOTE — Telephone Encounter (Signed)
Last OV 08/17/21 

## 2021-09-28 DIAGNOSIS — M542 Cervicalgia: Secondary | ICD-10-CM | POA: Diagnosis not present

## 2021-09-28 DIAGNOSIS — M545 Low back pain, unspecified: Secondary | ICD-10-CM | POA: Diagnosis not present

## 2021-09-28 DIAGNOSIS — M9902 Segmental and somatic dysfunction of thoracic region: Secondary | ICD-10-CM | POA: Diagnosis not present

## 2021-09-28 DIAGNOSIS — M9901 Segmental and somatic dysfunction of cervical region: Secondary | ICD-10-CM | POA: Diagnosis not present

## 2021-09-28 DIAGNOSIS — M9903 Segmental and somatic dysfunction of lumbar region: Secondary | ICD-10-CM | POA: Diagnosis not present

## 2021-09-29 ENCOUNTER — Other Ambulatory Visit: Payer: Self-pay | Admitting: Medical-Surgical

## 2021-09-29 MED ORDER — GABAPENTIN 100 MG PO CAPS: 100.0000 mg | ORAL_CAPSULE | Freq: Three times a day (TID) | ORAL | 1 refills | Status: DC | PRN

## 2021-10-05 DIAGNOSIS — M542 Cervicalgia: Secondary | ICD-10-CM | POA: Diagnosis not present

## 2021-10-05 DIAGNOSIS — M545 Low back pain, unspecified: Secondary | ICD-10-CM | POA: Diagnosis not present

## 2021-10-05 DIAGNOSIS — M9901 Segmental and somatic dysfunction of cervical region: Secondary | ICD-10-CM | POA: Diagnosis not present

## 2021-10-05 DIAGNOSIS — M9902 Segmental and somatic dysfunction of thoracic region: Secondary | ICD-10-CM | POA: Diagnosis not present

## 2021-10-05 DIAGNOSIS — M9903 Segmental and somatic dysfunction of lumbar region: Secondary | ICD-10-CM | POA: Diagnosis not present

## 2021-10-12 ENCOUNTER — Ambulatory Visit (INDEPENDENT_AMBULATORY_CARE_PROVIDER_SITE_OTHER): Payer: BC Managed Care – PPO

## 2021-10-12 ENCOUNTER — Other Ambulatory Visit: Payer: Self-pay

## 2021-10-12 ENCOUNTER — Ambulatory Visit: Payer: BC Managed Care – PPO | Admitting: Sports Medicine

## 2021-10-12 DIAGNOSIS — G8929 Other chronic pain: Secondary | ICD-10-CM | POA: Diagnosis not present

## 2021-10-12 DIAGNOSIS — M25511 Pain in right shoulder: Secondary | ICD-10-CM

## 2021-10-12 DIAGNOSIS — K561 Intussusception: Secondary | ICD-10-CM | POA: Diagnosis not present

## 2021-10-12 DIAGNOSIS — G9589 Other specified diseases of spinal cord: Secondary | ICD-10-CM | POA: Diagnosis not present

## 2021-10-12 HISTORY — DX: Intussusception: K56.1

## 2021-10-12 NOTE — Assessment & Plan Note (Signed)
Larry Mercado returns, he is a pleasant 40 year old male, at least 3 months of pain in the right shoulder, localized at the glenohumeral joint, positive crank test, O'Brien test, rotator cuff strength is good. Negative speeds and Yergason signs. Considering duration of symptomatology, as well as failure of conservative treatment we agreed to proceed with x-rays and MR arthrography of the right shoulder.

## 2021-10-12 NOTE — Assessment & Plan Note (Signed)
Larry Mercado also has neck pain with radiation into the periscapular region, on a prior MRI it was noted that there was some myomalacia at a couple of levels. For full follow-up of this and to rule out multiple sclerosis or transverse myelitis we do need an updated cervical spine MRI with IV contrast.

## 2021-10-12 NOTE — Progress Notes (Signed)
° ° °  Procedures performed today:    None.  Independent interpretation of notes and tests performed by another provider:   None.  Brief History, Exam, Impression, and Recommendations:    Right shoulder pain Larry Mercado returns, he is a pleasant 40 year old male, at least 3 months of pain in the right shoulder, localized at the glenohumeral joint, positive crank test, O'Brien test, rotator cuff strength is good. Negative speeds and Yergason signs. Considering duration of symptomatology, as well as failure of conservative treatment we agreed to proceed with x-rays and MR arthrography of the right shoulder.   Cervical cord myelomalacia (HCC) Larry Mercado also has neck pain with radiation into the periscapular region, on a prior MRI it was noted that there was some myomalacia at a couple of levels. For full follow-up of this and to rule out multiple sclerosis or transverse myelitis we do need an updated cervical spine MRI with IV contrast.  Jejunal intussusception (HCC) History of jejunal intussusception about 3 years ago. I would like Larry Mercado to revisit this with Dr. Barron Alvine, he is having episodes of increasing abdominal, right upper quadrant and flank pain.    ___________________________________________ Larry Mercado. Larry Mercado, M.D., ABFM., CAQSM. Primary Care and Sports Medicine [facility]  Adjunct Instructor of Family Medicine  [employer]

## 2021-10-12 NOTE — Assessment & Plan Note (Signed)
History of jejunal intussusception about 3 years ago. I would like Larry Mercado to revisit this with Dr. Barron Alvine, he is having episodes of increasing abdominal, right upper quadrant and flank pain.

## 2021-10-14 DIAGNOSIS — M542 Cervicalgia: Secondary | ICD-10-CM | POA: Diagnosis not present

## 2021-10-14 DIAGNOSIS — M545 Low back pain, unspecified: Secondary | ICD-10-CM | POA: Diagnosis not present

## 2021-10-14 DIAGNOSIS — M9902 Segmental and somatic dysfunction of thoracic region: Secondary | ICD-10-CM | POA: Diagnosis not present

## 2021-10-14 DIAGNOSIS — M9903 Segmental and somatic dysfunction of lumbar region: Secondary | ICD-10-CM | POA: Diagnosis not present

## 2021-10-14 DIAGNOSIS — M9901 Segmental and somatic dysfunction of cervical region: Secondary | ICD-10-CM | POA: Diagnosis not present

## 2021-10-18 ENCOUNTER — Ambulatory Visit (INDEPENDENT_AMBULATORY_CARE_PROVIDER_SITE_OTHER): Payer: BC Managed Care – PPO

## 2021-10-18 ENCOUNTER — Other Ambulatory Visit: Payer: Self-pay

## 2021-10-18 ENCOUNTER — Ambulatory Visit: Payer: BC Managed Care – PPO | Admitting: Sports Medicine

## 2021-10-18 DIAGNOSIS — M19011 Primary osteoarthritis, right shoulder: Secondary | ICD-10-CM | POA: Diagnosis not present

## 2021-10-18 DIAGNOSIS — G8929 Other chronic pain: Secondary | ICD-10-CM

## 2021-10-18 DIAGNOSIS — G9589 Other specified diseases of spinal cord: Secondary | ICD-10-CM | POA: Diagnosis not present

## 2021-10-18 DIAGNOSIS — R079 Chest pain, unspecified: Secondary | ICD-10-CM | POA: Diagnosis not present

## 2021-10-18 DIAGNOSIS — K429 Umbilical hernia without obstruction or gangrene: Secondary | ICD-10-CM | POA: Diagnosis not present

## 2021-10-18 DIAGNOSIS — M25511 Pain in right shoulder: Secondary | ICD-10-CM | POA: Diagnosis not present

## 2021-10-18 DIAGNOSIS — R1011 Right upper quadrant pain: Secondary | ICD-10-CM | POA: Diagnosis not present

## 2021-10-18 DIAGNOSIS — M50021 Cervical disc disorder at C4-C5 level with myelopathy: Secondary | ICD-10-CM | POA: Diagnosis not present

## 2021-10-18 DIAGNOSIS — M67813 Other specified disorders of tendon, right shoulder: Secondary | ICD-10-CM | POA: Diagnosis not present

## 2021-10-18 DIAGNOSIS — R072 Precordial pain: Secondary | ICD-10-CM | POA: Diagnosis not present

## 2021-10-18 DIAGNOSIS — R109 Unspecified abdominal pain: Secondary | ICD-10-CM | POA: Diagnosis not present

## 2021-10-18 DIAGNOSIS — Z8669 Personal history of other diseases of the nervous system and sense organs: Secondary | ICD-10-CM | POA: Diagnosis not present

## 2021-10-18 MED ORDER — GADOBUTROL 1 MMOL/ML IV SOLN
7.5000 mL | Freq: Once | INTRAVENOUS | Status: AC | PRN
  Administered 2021-10-18: 10:00:00 7.5 mL via INTRAVENOUS

## 2021-10-18 NOTE — Assessment & Plan Note (Signed)
Please see prior notes for further details on the history of presenting illness, arthrogram injection today, further management will depend on MRI results.

## 2021-10-18 NOTE — Progress Notes (Signed)
° ° °  Procedures performed today:    Procedure: Real-time Ultrasound Guided gadolinium contrast injection of right glenohumeral joint Device: Samsung HS60  Verbal informed consent obtained.  Time-out conducted.  Noted no overlying erythema, induration, or other signs of local infection.  Skin prepped in a sterile fashion.  Local anesthesia: Topical Ethyl chloride.  With sterile technique and under real time ultrasound guidance: Noted normal-appearing joint, 1/2 cc kenalog 40, 2 cc lidocaine, 2 cc bupivacaine injected, syringe switched and 0.1 cc gadolinium injected, syringe again switched and 10 cc sterile saline used to fully distend the joint. Joint visualized and capsule seen distending confirming intra-articular placement of contrast material and medication. Completed without difficulty  Advised to call if fevers/chills, erythema, induration, drainage, or persistent bleeding.  Images permanently stored in PACS Impression: Technically successful ultrasound guided gadolinium contrast injection for MR arthrography.  Please see separate MR arthrogram report.  Independent interpretation of notes and tests performed by another provider:   None.  Brief History, Exam, Impression, and Recommendations:    Right shoulder pain Please see prior notes for further details on the history of presenting illness, arthrogram injection today, further management will depend on MRI results.    ___________________________________________ Ihor Austin. Benjamin Stain, M.D., ABFM., CAQSM. Primary Care and Sports Medicine [facility]  Adjunct Instructor of Family Medicine  [employer]

## 2021-10-21 ENCOUNTER — Other Ambulatory Visit: Payer: Self-pay | Admitting: Pulmonary Disease

## 2021-10-21 ENCOUNTER — Other Ambulatory Visit: Payer: Self-pay | Admitting: Medical-Surgical

## 2021-10-26 DIAGNOSIS — M9903 Segmental and somatic dysfunction of lumbar region: Secondary | ICD-10-CM | POA: Diagnosis not present

## 2021-10-26 DIAGNOSIS — M545 Low back pain, unspecified: Secondary | ICD-10-CM | POA: Diagnosis not present

## 2021-10-26 DIAGNOSIS — M9901 Segmental and somatic dysfunction of cervical region: Secondary | ICD-10-CM | POA: Diagnosis not present

## 2021-10-26 DIAGNOSIS — M542 Cervicalgia: Secondary | ICD-10-CM | POA: Diagnosis not present

## 2021-10-26 DIAGNOSIS — M9902 Segmental and somatic dysfunction of thoracic region: Secondary | ICD-10-CM | POA: Diagnosis not present

## 2021-11-07 ENCOUNTER — Other Ambulatory Visit: Payer: Self-pay | Admitting: Medical-Surgical

## 2021-11-08 NOTE — Telephone Encounter (Signed)
Last OV with you 08/17/21, appt scheduled for 12/23/21

## 2021-11-15 ENCOUNTER — Other Ambulatory Visit (INDEPENDENT_AMBULATORY_CARE_PROVIDER_SITE_OTHER): Payer: BC Managed Care – PPO

## 2021-11-15 ENCOUNTER — Encounter: Payer: Self-pay | Admitting: Gastroenterology

## 2021-11-15 ENCOUNTER — Ambulatory Visit: Payer: BC Managed Care – PPO | Admitting: Gastroenterology

## 2021-11-15 ENCOUNTER — Other Ambulatory Visit: Payer: Self-pay

## 2021-11-15 ENCOUNTER — Other Ambulatory Visit: Payer: Self-pay | Admitting: *Deleted

## 2021-11-15 VITALS — BP 110/74 | HR 76 | Ht 72.0 in | Wt 172.4 lb

## 2021-11-15 DIAGNOSIS — R1084 Generalized abdominal pain: Secondary | ICD-10-CM | POA: Diagnosis not present

## 2021-11-15 DIAGNOSIS — K5900 Constipation, unspecified: Secondary | ICD-10-CM | POA: Diagnosis not present

## 2021-11-15 DIAGNOSIS — F419 Anxiety disorder, unspecified: Secondary | ICD-10-CM | POA: Diagnosis not present

## 2021-11-15 DIAGNOSIS — K219 Gastro-esophageal reflux disease without esophagitis: Secondary | ICD-10-CM | POA: Diagnosis not present

## 2021-11-15 DIAGNOSIS — F32A Depression, unspecified: Secondary | ICD-10-CM

## 2021-11-15 LAB — SEDIMENTATION RATE: Sed Rate: 3 mm/hr (ref 0–15)

## 2021-11-15 LAB — C-REACTIVE PROTEIN: CRP: <1 mg/dL (ref 0.5–20.0)

## 2021-11-15 MED ORDER — ESOMEPRAZOLE MAGNESIUM 40 MG PO CPDR: 40.0000 mg | DELAYED_RELEASE_CAPSULE | Freq: Every day | ORAL | 3 refills | Status: DC

## 2021-11-15 NOTE — Patient Instructions (Addendum)
If you are age 39 or older, your body mass index should be between 23-30. Your Body mass index is 23.38 kg/m. If this is out of the aforementioned range listed, please consider follow up with your Primary Care Provider.  If you are age 39 or younger, your body mass index should be between 19-25. Your Body mass index is 23.38 kg/m. If this is out of the aformentioned range listed, please consider follow up with your Primary Care Provider.   __________________________________________________________  The [facility] providers would like to encourage you to use [facility] to communicate with providers for non-urgent requests or questions.  Due to long hold times on the telephone, sending your provider a message by [facility] may be a faster and more efficient way to get a response.  Please allow 48 business hours for a response.  Please remember that this is for non-urgent requests.   Please go to the lab on the 2nd floor [address] before you leave the office today.   We have given you a sample of Clenpiq to be used for your colonoscopy.  We have sent the following medications to your pharmacy for you to pick up at your convenience:  Nexium 40mg  1 tablet daily  Due to recent changes in healthcare laws, you may see the results of your imaging and laboratory studies on MyChart before your provider has had a chance to review them.  We understand that in some cases there may be results that are confusing or concerning to you. Not all laboratory results come back in the same time frame and the provider may be waiting for multiple results in order to interpret others.  Please give 48 hours in order for your provider to thoroughly review all the results before contacting the office for clarification of your results.   Thank you for choosing me and  Gastroenterology.  Vito Cirigliano, D.O.

## 2021-11-15 NOTE — Progress Notes (Signed)
Chief Complaint:    Abdominal pain  GI History: 41 year old male with a history of allergic rhinitis, ADD, chronic back pain, hyperlipidemia, IBS, GERD, right hip surgery 2019, initially seen in GI clinic 03/2019 for evaluation of intermittent right-sided abdominal discomfort.  No hematochezia, melena.  No known personal or family history of IBD, celiac disease, GI malignancy, liver or pancreaticobiliary disease. - 02/19/2019: CT abdomen/pelvis: Focal intussusception and proximal jejunum measuring 3 cm in length with otherwise normal-appearing abdomen.  No bowel wall thickening - 02/19/2019: Normal CBC, CMP, lipase, UA - 03/28/2019: GI virtual evaluation.  Recommended MR enterography. - 04/08/2019: MR enterography: Normal   Longstanding history of GERD.  Index symptoms of heartburn and regurgitation.  Controlled with dietary modifications and occasional OTC antacids prn.  For his history of IBS, describes years of abdominal discomfort and bloating.  Occasional changes in bowel habits (loose stools).  Has had EGD, colonoscopy, and abdominal US at outside facilities in the past, without any findings.  No records for review in EMR. Done in approx 2012.  Good response to trial of Bentyl in 2020.  HPI:     Patient is a 41 y.o. male presenting to the Gastroenterology Clinic for follow-up.  Initially seen by me via virtual appointment in 03/2019 for evaluation of intussusception as above.  He was seen in [facility] 10/18/2021 and notes reviewed: - Normal CBC, CMP, lipase, troponin - Normal EKG - RUQ [place]: Normal  - CT abdomen/pelvis: Normal, small fat-containing umbilical hernia  Today, he states he continues to have intermittent upper abdominal discomfort.  Pain tends to be R > L.  Pain can migrate all over the abdomen.  Occurs at random, lasting a few seconds, then can recur throughout the day.  Does have associated generalized abdominal itching, but no rash.  No associated diarrhea, nausea/vomiting,  fever, chills, hematochezia, melena.  Does have constipation over last several months, described as incomplete bowel evacuation. Will still have 1 BM/day with intermittent straining.   Has trialed Mobic, Lyrica , Gabapentin (all prescribed for other MSK issues) as well as menthol patches, and lidocaine patches recently prescribed from the ER, but no noticeable improvement. Was taking amitryptiline, but does not think abdominal sxs were any better then.   Right hip surgery in 02/2021.  Following with Dr. Dianah Field for right shoulder issues as well.  Currently prescribed Zoloft for anxiety/depression.  Separately, increase in reflux symptoms recently as well.  Reports reflux was previously well controlled dietary modifications and Protonix.  Insurance then declined any further Protonix Rx, and symptoms suboptimally controlled since changing to Prilosec 40 mg/day.  No dysphagia.  Review of systems:     No chest pain, no SOB, no fevers, no urinary sx   Past Medical History:  Diagnosis Date   Allergic rhinitis    Attention deficit    Chronic back pain    Dyslipidemia    Dyspepsia    GERD (gastroesophageal reflux disease)    Groin pain, right lower quadrant    Hemorrhoids    internal, per patient   Herniated cervical disc    IBS (irritable bowel syndrome)    Sebaceous cyst    Vertigo     Patient's surgical history, family medical history, social history, medications and allergies were all reviewed in Epic    Current Outpatient Medications  Medication Sig Dispense Refill   albuterol (VENTOLIN HFA) 108 (90 Base) MCG/ACT inhaler INHALE 2 PUFFS INTO THE LUNGS EVERY 6 HOURS AS NEEDED FOR WHEEZING 8.5 g  3   gabapentin (NEURONTIN) 100 MG capsule Take 1-2 capsules (100-200 mg total) by mouth 3 (three) times daily as needed. 180 capsule 1   metroNIDAZOLE (METROGEL) 0.75 % gel Apply 1 application topically 2 (two) times daily. 45 g 3   omeprazole (PRILOSEC) 40 MG capsule Take 1 capsule (40 mg  total) by mouth daily. 90 capsule 1   sertraline (ZOLOFT) 50 MG tablet Take 1 tablet (50 mg total) by mouth daily. 30 tablet 3   SYMBICORT 160-4.5 MCG/ACT inhaler INHALE 2 PUFFS INTO THE LUNGS IN THE IN THE MORNING AND AT BEDTIME 30.6 g 3   No current facility-administered medications for this visit.    Physical Exam:     BP 110/74    Pulse 76    Ht 6' (1.829 m)    Wt 172 lb 6 oz (78.2 kg)    SpO2 99%    BMI 23.38 kg/m   GENERAL:  Pleasant male in NAD PSYCH: : Cooperative, normal affect EENT:  conjunctiva pink, mucous membranes moist, neck supple without masses CARDIAC:  RRR, no murmur heard, no peripheral edema PULM: Normal respiratory effort, lungs CTA bilaterally, no wheezing ABDOMEN:  Nondistended, soft, nontender. No obvious masses, no hepatomegaly,  normal bowel sounds SKIN:  turgor, no lesions seen Musculoskeletal:  Normal muscle tone, normal strength NEURO: Alert and oriented x 3, no focal neurologic deficits   IMPRESSION and PLAN:    1) Generalized Abdominal Pain Discussed the broad DDx to include GI and non-GI etiologies for his presenting symptoms.  Given the brief, lightning strike pain, do not think prn medications would be of much benefit.  Plan to evaluate as below:  - ESR, CRP - Spot urinary PBG, porphoryins, urinary creat - EGD and colonoscopy to evaluate for mucosal/luminal pathology - Start low FODMAP.  Provided with handout and detailed instructions today - Has had extensive imaging to date.  No repeat studies ordered today  2) GERD - Change to Nexium 40 mg/day - Continue antireflux lifestyle/dietary modifications - EGD to evaluate for erosive esophagitis, LES laxity  3) Anxiety and Depression - Follow-up with PCM - Briefly discussed relationship between vague symptomatology and underlying anxiety  4) Constipation 5) Change in bowel habits - Colonoscopy to evaluate for mucosal/luminal pathology - High index of suspicion for IBS related to underlying  anxiety - Increase fluids - Increase dietary fiber and add fiber supplement as needed  The indications, risks, and benefits of EGD and colonoscopy were explained to the patient in detail. Risks include but are not limited to bleeding, perforation, adverse reaction to medications, and cardiopulmonary compromise. Sequelae include but are not limited to the possibility of surgery, hospitalization, and mortality. The patient verbalized understanding and wished to proceed. All questions answered, referred to scheduler and bowel prep ordered. Further recommendations pending results of the exam.            Lavena Bullion ,DO, FACG 11/15/2021, 8:59 AM

## 2021-11-15 NOTE — Addendum Note (Signed)
Addended by: Latricia Heft A on: 11/15/2021 10:12 AM   Modules accepted: Orders

## 2021-11-15 NOTE — Addendum Note (Signed)
Addended by: Latricia Heft A on: 11/15/2021 10:10 AM   Modules accepted: Orders

## 2021-11-16 ENCOUNTER — Encounter: Payer: Self-pay | Admitting: Gastroenterology

## 2021-11-17 LAB — PORPHOBILINOGEN, RANDOM URINE: Porphobilinogen, Rand Ur: 0.5 mg/L (ref 0.0–2.0)

## 2021-11-18 ENCOUNTER — Other Ambulatory Visit: Payer: Self-pay | Admitting: Medical-Surgical

## 2021-11-18 LAB — PORPHYRINS, FRACTIONATED, RANDOM URINE
Coproporphyrin (CP) I: 9 ug/L (ref 0–15)
Coproporphyrin (CP) III: 37 ug/L (ref 0–49)
Heptacarboxyl (7-CP): 2 ug/L (ref 0–2)
Hexacarboxyl (6-CP): <1 ug/L (ref 0–1)
Pentacarboxyl (5-CP): <1 ug/L (ref 0–2)
Uroporphyrins (UP): 3 ug/L (ref 0–20)

## 2021-11-18 LAB — CREATININE, URINE, RANDOM: Creatinine, Urine: 39.6 mg/dL

## 2021-11-23 ENCOUNTER — Other Ambulatory Visit: Payer: Self-pay

## 2021-11-23 ENCOUNTER — Ambulatory Visit (AMBULATORY_SURGERY_CENTER): Payer: BC Managed Care – PPO | Admitting: Gastroenterology

## 2021-11-23 ENCOUNTER — Encounter: Payer: Self-pay | Admitting: Gastroenterology

## 2021-11-23 VITALS — BP 110/78 | HR 68 | Temp 98.3°F | Resp 14 | Ht 72.0 in | Wt 172.0 lb

## 2021-11-23 DIAGNOSIS — K2970 Gastritis, unspecified, without bleeding: Secondary | ICD-10-CM | POA: Diagnosis not present

## 2021-11-23 DIAGNOSIS — K219 Gastro-esophageal reflux disease without esophagitis: Secondary | ICD-10-CM

## 2021-11-23 DIAGNOSIS — B3781 Candidal esophagitis: Secondary | ICD-10-CM

## 2021-11-23 DIAGNOSIS — K5900 Constipation, unspecified: Secondary | ICD-10-CM

## 2021-11-23 DIAGNOSIS — K640 First degree hemorrhoids: Secondary | ICD-10-CM

## 2021-11-23 DIAGNOSIS — R1084 Generalized abdominal pain: Secondary | ICD-10-CM

## 2021-11-23 DIAGNOSIS — K2950 Unspecified chronic gastritis without bleeding: Secondary | ICD-10-CM | POA: Diagnosis not present

## 2021-11-23 MED ORDER — SODIUM CHLORIDE 0.9 % IV SOLN: 500.0000 mL | Freq: Once | INTRAVENOUS | Status: DC

## 2021-11-23 NOTE — Progress Notes (Signed)
VS-CW  Pt's states no medical or surgical changes since previsit or office visit.  

## 2021-11-23 NOTE — Progress Notes (Signed)
Called to room to assist during endoscopic procedure.  Patient ID and intended procedure confirmed with present staff. Received instructions for my participation in the procedure from the performing physician.  

## 2021-11-23 NOTE — Patient Instructions (Signed)
YOU HAD AN ENDOSCOPIC PROCEDURE TODAY AT THE [facility]:   Refer to the procedure report that was given to you for any specific questions about what was found during the examination.  If the procedure report does not answer your questions, please call your gastroenterologist to clarify.  If you requested that your care partner not be given the details of your procedure findings, then the procedure report has been included in a sealed envelope for you to review at your convenience later.  YOU SHOULD EXPECT: Some feelings of bloating in the abdomen. Passage of more gas than usual.  Walking can help get rid of the air that was put into your GI tract during the procedure and reduce the bloating. If you had a lower endoscopy (such as a colonoscopy or flexible sigmoidoscopy) you may notice spotting of blood in your stool or on the toilet paper. If you underwent a bowel prep for your procedure, you may not have a normal bowel movement for a few days.  Please Note:  You might notice some irritation and congestion in your nose or some drainage.  This is from the oxygen used during your procedure.  There is no need for concern and it should clear up in a day or so.  SYMPTOMS TO REPORT IMMEDIATELY:   Following lower endoscopy (colonoscopy or flexible sigmoidoscopy):  Excessive amounts of blood in the stool  Significant tenderness or worsening of abdominal pains  Swelling of the abdomen that is new, acute  Fever of 100F or higher   Following upper endoscopy (EGD)  Vomiting of blood or coffee ground material  New chest pain or pain under the shoulder blades  Painful or persistently difficult swallowing  New shortness of breath  Fever of 100F or higher  Black, tarry-looking stools  For urgent or emergent issues, a gastroenterologist can be reached at any hour by calling [phone]. Do not use MyChart messaging for urgent concerns.    DIET:  We do recommend a small meal at first, but  then you may proceed to your regular diet.  Drink plenty of fluids but you should avoid alcoholic beverages for 24 hours.  ACTIVITY:  You should plan to take it easy for the rest of today and you should NOT DRIVE or use heavy machinery until tomorrow (because of the sedation medicines used during the test).    FOLLOW UP: Our staff will call the number listed on your records 48-72 hours following your procedure to check on you and address any questions or concerns that you may have regarding the information given to you following your procedure. If we do not reach you, we will leave a message.  We will attempt to reach you two times.  During this call, we will ask if you have developed any symptoms of COVID 19. If you develop any symptoms (ie: fever, flu-like symptoms, shortness of breath, cough etc.) before then, please call [phone].  If you test positive for Covid 19 in the 2 weeks post procedure, please call and report this information to us.    If any biopsies were taken you will be contacted by phone or by letter within the next 1-3 weeks.  Please call us at [phone] if you have not heard about the biopsies in 3 weeks.    SIGNATURES/CONFIDENTIALITY: You and/or your care partner have signed paperwork which will be entered into your electronic medical record.  These signatures attest to the fact that that the information above on   your After Visit Summary has been reviewed and is understood.  Full responsibility of the confidentiality of this discharge information lies with you and/or your care-partner. 

## 2021-11-23 NOTE — Progress Notes (Signed)
GASTROENTEROLOGY PROCEDURE H&P NOTE   Primary Care Physician: Samuel Bouche, NP    Reason for Procedure:  Generalized abdominal pain, GERD, constipation, change in bowel habits  Plan:    EGD, colonoscopy  Patient is appropriate for endoscopic procedure(s) in the ambulatory ([place]) setting.  The nature of the procedure, as well as the risks, benefits, and alternatives were carefully and thoroughly reviewed with the patient. Ample time for discussion and questions allowed. The patient understood, was satisfied, and agreed to proceed.     HPI: Larry Mercado is a 41 y.o. male who presents for EGD and colonoscopy for evaluation of generalized abdominal pain, constipation, change in bowel habits, and GERD.  Patient was most recently seen in the Gastroenterology Clinic on 11/15/2021 by me.  Was started on Nexium 40 mg/day at that time.  Normal ESR, CRP, urinary porphyrin testing.  Otherwise, no interval change in medical history since that appointment. Please refer to that note for full details regarding GI history and clinical presentation.   Past Medical History:  Diagnosis Date   Allergic rhinitis    Attention deficit    Chronic back pain    Dyslipidemia    Dyspepsia    GERD (gastroesophageal reflux disease)    Groin pain, right lower quadrant    Hemorrhoids    internal, per patient   Herniated cervical disc    IBS (irritable bowel syndrome)    Sebaceous cyst    Vertigo     Past Surgical History:  Procedure Laterality Date   COLONOSCOPY  2012   In [place] he believes but don't know where. Was told that he had internal hemorrhoids   ESOPHAGOGASTRODUODENOSCOPY  2012   believes it was done same time with colonoscopy   HYDROCELE EXCISION / [place]      Prior to Admission medications   Medication Sig Start Date End Date Taking? Authorizing Provider  albuterol (VENTOLIN HFA) 108 (90 Base) MCG/ACT inhaler INHALE 2 PUFFS INTO THE  LUNGS EVERY 6 HOURS AS NEEDED FOR WHEEZING 11/05/21   Mannam, Praveen, MD  esomeprazole (NEXIUM) 40 MG capsule Take 1 capsule (40 mg total) by mouth daily at 12 noon. 11/15/21 12/15/21  Cade Dashner V, DO  gabapentin (NEURONTIN) 100 MG capsule Take 1-2 capsules (100-200 mg total) by mouth 3 (three) times daily as needed. 09/29/21   Samuel Bouche, NP  metroNIDAZOLE (METROGEL) 0.75 % gel Apply 1 application topically 2 (two) times daily. 05/12/21   Samuel Bouche, NP  sertraline (ZOLOFT) 50 MG tablet Take 1 tablet (50 mg total) by mouth daily. 08/30/21   Samuel Bouche, NP  SYMBICORT 160-4.5 MCG/ACT inhaler INHALE 2 PUFFS INTO THE LUNGS IN THE IN THE MORNING AND AT BEDTIME 09/24/21   Mannam, Hart Robinsons, MD    Current Outpatient Medications  Medication Sig Dispense Refill   albuterol (VENTOLIN HFA) 108 (90 Base) MCG/ACT inhaler INHALE 2 PUFFS INTO THE LUNGS EVERY 6 HOURS AS NEEDED FOR WHEEZING 8.5 g 3   esomeprazole (NEXIUM) 40 MG capsule Take 1 capsule (40 mg total) by mouth daily at 12 noon. 30 capsule 3   gabapentin (NEURONTIN) 100 MG capsule Take 1-2 capsules (100-200 mg total) by mouth 3 (three) times daily as needed. 180 capsule 1   metroNIDAZOLE (METROGEL) 0.75 % gel Apply 1 application topically 2 (two) times daily. 45 g 3   sertraline (ZOLOFT) 50 MG tablet Take 1 tablet (50 mg total) by mouth daily.  30 tablet 3   SYMBICORT 160-4.5 MCG/ACT inhaler INHALE 2 PUFFS INTO THE LUNGS IN THE IN THE MORNING AND AT BEDTIME 30.6 g 3   No current facility-administered medications for this visit.    Allergies as of 11/23/2021 - Review Complete 11/23/2021  Allergen Reaction Noted   Abilify [aripiprazole]  02/25/2015    Family History  Problem Relation Age of Onset   Cancer Father        brain   Mitral valve prolapse Father    Heart disease Paternal Grandfather    Alzheimer's disease Maternal Grandmother    Mesothelioma Paternal Grandmother    Prostate cancer Maternal Grandfather    Colon cancer Neg Hx     Esophageal cancer Neg Hx     Social History   Socioeconomic History   Marital status: Married    Spouse name: Manufacturing systems engineer   Number of children: Not on file   Years of education: Not on file   Highest education level: Not on file  Occupational History   Occupation: Full time  Tobacco Use   Smoking status: Never   Smokeless tobacco: Never  Vaping Use   Vaping Use: Never used  Substance and Sexual Activity   Alcohol use: Not Currently    Comment: rarely    Drug use: No   Sexual activity: Yes  Other Topics Concern   Not on file  Social History Narrative   Lives with wife and 3 children   Right Handed   Drinks 3-4 cups caffeine daily   Social Determinants of Health   Financial Resource Strain: Not on file  Food Insecurity: Not on file  Transportation Needs: Not on file  Physical Activity: Not on file  Stress: Not on file  Social Connections: Not on file  Intimate Partner Violence: Not on file    Physical Exam: Vital signs in last 24 hours: '@There'  were no vitals taken for this visit. GEN: NAD EYE: Sclerae anicteric ENT: MMM CV: Non-tachycardic Pulm: CTA b/l GI: Soft, NT/ND NEURO:  Alert & Oriented x 3   Gerrit Heck, DO [facility]   11/23/2021 1:54 PM

## 2021-11-23 NOTE — Op Note (Signed)
[facility] Patient Name: Larry Mercado Procedure Date: 11/23/2021 1:58 PM MRN: [id number] Endoscopist: Doristine Locks , MD Age: 41 Referring MD:  Date of Birth: 1981-09-14 Gender: Male Account #: [id number] Procedure:                Upper GI endoscopy Indications:              Generalized abdominal pain, Suspected esophageal                            reflux Medicines:                Monitored Anesthesia Care Procedure:                Pre-Anesthesia Assessment:                           - Prior to the procedure, a History and Physical                            was performed, and patient medications and                            allergies were reviewed. The patient's tolerance of                            previous anesthesia was also reviewed. The risks                            and benefits of the procedure and the sedation                            options and risks were discussed with the patient.                            All questions were answered, and informed consent                            was obtained. Prior Anticoagulants: The patient has                            taken no previous anticoagulant or antiplatelet                            agents. ASA Grade Assessment: II - A patient with                            mild systemic disease. After reviewing the risks                            and benefits, the patient was deemed in                            satisfactory condition to undergo the procedure.  After obtaining informed consent, the endoscope was                            passed under direct vision. Throughout the                            procedure, the patient's blood pressure, pulse, and                            oxygen saturations were monitored continuously. The                            Olympus [id number] was introduced through                            the mouth, and advanced to the second part of                             duodenum. The upper GI endoscopy was accomplished                            without difficulty. The patient tolerated the                            procedure well. Scope In: Scope Out: Findings:                 Multiple small white plaques were found in the                            middle third of the esophagus and in the lower                            third of the esophagus. These did not clear with                            water lavage. Biopsies were taken with a cold                            forceps for histology. Estimated blood loss was                            minimal.                           The Z-line was regular and was found 45 cm from the                            incisors.                           The gastroesophageal flap valve was visualized                            endoscopically and classified as Hill  Grade II                            (fold present, opens with respiration).                           The entire examined stomach was normal. Biopsies                            were taken with a cold forceps for Helicobacter                            pylori testing. Estimated blood loss was minimal.                           The examined duodenum was normal. Complications:            No immediate complications. Estimated Blood Loss:     Estimated blood loss was minimal. Impression:               - Multiple plaques in the middle third of the                            esophagus and in the lower third of the esophagus.                            Biopsied.                           - Z-line regular, 45 cm from the incisors.                           - Gastroesophageal flap valve classified as Hill                            Grade II (fold present, opens with respiration).                           - Normal stomach. Biopsied.                           - Normal examined duodenum. Recommendation:           - Patient has a contact number  available for                            emergencies. The signs and symptoms of potential                            delayed complications were discussed with the                            patient. Return to normal activities tomorrow.                            Written discharge instructions were provided to the  patient.                           - Resume previous diet.                           - Continue present medications.                           - Await pathology results. Doristine LocksVito Ryan Ogborn, MD 11/23/2021 2:52:43 PM

## 2021-11-23 NOTE — Progress Notes (Signed)
To pacu, VSS. Report to [email or web address] 

## 2021-11-23 NOTE — Op Note (Signed)
[facility] Patient Name: Larry Mercado Procedure Date: 11/23/2021 1:50 PM MRN: [id number] Endoscopist: Gerrit Heck , MD Age: 41 Referring MD:  Date of Birth: [birth year] Gender: Male Account #: [id number] Procedure:                Colonoscopy Indications:              Generalized abdominal pain, Change in bowel habits,                            Constipation Medicines:                Monitored Anesthesia Care Procedure:                Pre-Anesthesia Assessment:                           - Prior to the procedure, a History and Physical                            was performed, and patient medications and                            allergies were reviewed. The patient's tolerance of                            previous anesthesia was also reviewed. The risks                            and benefits of the procedure and the sedation                            options and risks were discussed with the patient.                            All questions were answered, and informed consent                            was obtained. Prior Anticoagulants: The patient has                            taken no previous anticoagulant or antiplatelet                            agents. ASA Grade Assessment: II - A patient with                            mild systemic disease. After reviewing the risks                            and benefits, the patient was deemed in                            satisfactory condition to undergo the procedure.  After obtaining informed consent, the colonoscope                            was passed under direct vision. Throughout the                            procedure, the patient's blood pressure, pulse, and                            oxygen saturations were monitored continuously. The                            Olympus CF-HQ190L [id number]) Colonoscope was                            introduced through the anus and advanced to the the                             terminal ileum. The colonoscopy was performed                            without difficulty. The patient tolerated the                            procedure well. The quality of the bowel                            preparation was good. The terminal ileum, ileocecal                            valve, appendiceal orifice, and rectum were                            photographed. Scope In: 2:34:05 PM Scope Out: 2:46:06 PM Scope Withdrawal Time: 0 hours 9 minutes 42 seconds  Total Procedure Duration: 0 hours 12 minutes 1 second  Findings:                 The perianal and digital rectal examinations were                            normal.                           The colon appeared normal throughout. No areas of                            mucosal erythema, edema, erosions, or ulceration,                            and no luminal narrowing or strictures.                           Non-bleeding internal hemorrhoids were found during  retroflexion. The hemorrhoids were small and Grade                            I (internal hemorrhoids that do not prolapse).                           The terminal ileum appeared normal. Complications:            No immediate complications. Estimated Blood Loss:     Estimated blood loss: none. Impression:               - The entire examined colon is normal.                           - Non-bleeding internal hemorrhoids.                           - The examined portion of the ileum was normal.                           - No specimens collected. Recommendation:           - Patient has a contact number available for                            emergencies. The signs and symptoms of potential                            delayed complications were discussed with the                            patient. Return to normal activities tomorrow.                            Written discharge instructions were provided to the                             patient.                           - Resume previous diet.                           - Continue present medications.                           - Repeat colonoscopy in 10 years for screening                            purposes.                           - Use fiber, for example Citrucel, Fibercon, Konsyl                            or Metamucil.                           -  Return to GI clinic PRN. Gerrit Heck, MD 11/23/2021 2:55:33 PM

## 2021-11-25 ENCOUNTER — Telehealth: Payer: Self-pay

## 2021-11-25 NOTE — Telephone Encounter (Signed)
°  Follow up Call-  Call back number 11/23/2021  Post procedure Call Back phone  # [phone]  Permission to leave phone message Yes  Some recent data might be hidden     Patient questions:  Do you have a fever, pain , or abdominal swelling? No. Pain Score  0 *  Have you tolerated food without any problems? Yes.    Have you been able to return to your normal activities? Yes.    Do you have any questions about your discharge instructions: Diet   No. Medications  No. Follow up visit  No.  Do you have questions or concerns about your Care? No.  Actions: * If pain score is 4 or above: No action needed, pain <4.

## 2021-12-01 ENCOUNTER — Telehealth: Payer: Self-pay | Admitting: General Surgery

## 2021-12-01 ENCOUNTER — Encounter: Payer: Self-pay | Admitting: Sports Medicine

## 2021-12-01 ENCOUNTER — Encounter: Payer: Self-pay | Admitting: Gastroenterology

## 2021-12-01 ENCOUNTER — Encounter: Payer: Self-pay | Admitting: Medical-Surgical

## 2021-12-01 DIAGNOSIS — B3781 Candidal esophagitis: Secondary | ICD-10-CM

## 2021-12-01 MED ORDER — FLUCONAZOLE 100 MG PO TABS: ORAL_TABLET | ORAL | 0 refills | Status: DC

## 2021-12-01 MED ORDER — FLUCONAZOLE 100 MG PO TABS
ORAL_TABLET | ORAL | 0 refills | Status: AC
Start: 2021-12-01 — End: 2021-12-22

## 2021-12-01 NOTE — Telephone Encounter (Signed)
-----   Message from Vito Cirigliano V, DO sent at 12/01/2021 11:20 AM EST ----- °Biopsies from the upper endoscopy notable for the following: °-The biopsies taken from your stomach were notable for gastritis (inflammation), but there was no evidence of Helicobacter pylori infection.  °-The biopsies from the lower esophagus confirm Candida Esophagitis (infection).  Plan to treat with fluconazole as below ° °Recommendations: °- Fluconazole 200 mg x 1 then 100 mg daily x20 days.  Prescribe 100 mg tablets, #22, RF 0 °- Ensure drinking water after inhaler use to wash out esophagus of any residual steroid °

## 2021-12-01 NOTE — Telephone Encounter (Signed)
-----   Message from [place], DO sent at 12/01/2021 11:20 AM EST ----- Biopsies from the upper endoscopy notable for the following: -The biopsies taken from your stomach were notable for gastritis (inflammation), but there was no evidence of Helicobacter pylori infection.  -The biopsies from the lower esophagus confirm Candida Esophagitis (infection).  Plan to treat with fluconazole as below  Recommendations: - Fluconazole 200 mg x 1 then 100 mg daily x20 days.  Prescribe 100 mg tablets, #22, RF 0 - Ensure drinking water after inhaler use to wash out esophagus of any residual steroid

## 2021-12-01 NOTE — Telephone Encounter (Signed)
Just sent a separate message to you about this patient.  Biopsies reviewed and does demonstrate Candida esophagitis.  Plan to treat with fluconazole.  Will see if chest pain resolves after treatment of infection.  Thank you.

## 2021-12-01 NOTE — Telephone Encounter (Signed)
Went over results with the patient regarding candida esophagitis. The patient is aware of treatment and will call back if needed.

## 2021-12-01 NOTE — Telephone Encounter (Signed)
See mychart message, from today. Patient given results. Verbaziled understanding

## 2021-12-14 ENCOUNTER — Other Ambulatory Visit: Payer: Self-pay

## 2021-12-14 ENCOUNTER — Encounter: Payer: Self-pay | Admitting: Pulmonary Disease

## 2021-12-14 ENCOUNTER — Ambulatory Visit: Payer: BC Managed Care – PPO | Admitting: Pulmonary Disease

## 2021-12-14 VITALS — BP 118/62 | HR 78 | Temp 98.4°F | Ht 71.0 in | Wt 172.0 lb

## 2021-12-14 DIAGNOSIS — R0602 Shortness of breath: Secondary | ICD-10-CM | POA: Diagnosis not present

## 2021-12-14 DIAGNOSIS — J4530 Mild persistent asthma, uncomplicated: Secondary | ICD-10-CM

## 2021-12-14 DIAGNOSIS — Z23 Encounter for immunization: Secondary | ICD-10-CM

## 2021-12-14 NOTE — Patient Instructions (Signed)
I am not clear where you are chest pain is coming from.  It seems to me that it may be a musculoskeletal issue We will get a CT angiogram of the chest to rule out PE to make sure there is no other lung issue going on Follow-up in 1 to 2 months

## 2021-12-14 NOTE — Progress Notes (Signed)
Larry Mercado    [id number]    02-12-1981  Primary Care Physician:Jessup, Caryl Asp, NP  Referring Physician: Samuel Bouche, NP [address] [place],  [place] [id number]  Chief complaint: Follow-up for asthma  HPI: 41 year old with no significant past medical history except for mild GERD, acne rosacea. Complains of dyspnea for the past several years.  Symptoms are episodic with no clear aggravating factor.  They can occur at rest and with exertion. Describes feelings of chest tightness, inability to take deep breath.  Resolves spontaneously over time.  He has been evaluated bu cardiology for symptoms of palpitations with normal zio patch and echocardiogram.  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  Relevant family history: No significant family history of lung disease.   Interim history: Continues on Symbicort.  She states that this has overall helped with his breathing He occasionally gets episodic dyspnea. Hardly needs to use his rescue inhaler.  His chief complaint today is right shoulder pain, episodic upper chest pain on the right which is intermittent with no clear precipitating factors.  He is being followed by sports medicine with injections and MRI.  He wants to get his lungs checked out to make sure there are no pulmonary abnormalities causing this issue  Outpatient Encounter Medications as of 12/14/2021  Medication Sig   albuterol (VENTOLIN HFA) 108 (90 Base) MCG/ACT inhaler INHALE 2 PUFFS INTO THE LUNGS EVERY 6 HOURS AS NEEDED FOR WHEEZING   esomeprazole (NEXIUM) 40 MG capsule Take 1 capsule (40 mg total) by mouth daily at 12 noon.   fluconazole (DIFLUCAN) 100 MG tablet Take 2 tablets (200 mg total) by mouth daily for 1 day, THEN 1 tablet (100  mg total) daily for 20 days.   gabapentin (NEURONTIN) 100 MG capsule Take 1-2 capsules (100-200 mg total) by mouth 3 (three) times daily as needed.   lidocaine (LIDODERM) 5 % SMARTSIG:Patch(s) Topical   metroNIDAZOLE (METROGEL) 0.75 % gel Apply 1 application topically 2 (two) times daily.   sertraline (ZOLOFT) 50 MG tablet Take 1 tablet (50 mg total) by mouth daily.   SYMBICORT 160-4.5 MCG/ACT inhaler INHALE 2 PUFFS INTO THE LUNGS IN THE IN THE MORNING AND AT BEDTIME   No facility-administered encounter medications on file as of 12/14/2021.    Allergies as of 12/14/2021 - Review Complete 12/14/2021  Allergen Reaction Noted   Abilify [aripiprazole]  02/25/2015     Physical Exam: Blood pressure 118/62, pulse 78, temperature 98.4 F (36.9 C), temperature source Oral, height 5\' 11"  (1.803 m), weight 172 lb (78 kg), SpO2 95 %. Gen:      No acute distress HEENT:  EOMI, sclera anicteric Neck:     No masses; no thyromegaly Lungs:    Clear to auscultation bilaterally; normal respiratory effort CV:         Regular rate and rhythm; no murmurs Abd:      + bowel sounds; soft, non-tender; no palpable masses, no distension Ext:    No edema; adequate peripheral perfusion Skin:      Warm and dry; no rash Neuro: alert and oriented x 3 Psych: normal mood and affect   Data Reviewed: Imaging: Chest x ray 03/12/20- Mild peribronchial thickening. I have reviewed the images personally   PFTs: 12/22/2020 FVC  5.15 [87%], FEV1 4.19 [89%], F/F 81, TLC 7.68 [102%], DLCO 31.17 [90%] Normal test  Labs: CBC 07/02/2020-WBC 7.5, eos 2.8%, absolute eosinophil count 210 RAST panel 07/02/2020-IgE 9, no allergies  Cardiac: Echo 10/11/19 LVEF 60-65%, No LVH. Normal RVSP. Unable to determine PASP as no TR jet  Assessment:  Mild asthma Cadiac work is negative as noted above Likely has asthma, reactive airway disease given symptoms of mild allergies.and changes of bronchial airway thickening on CXR.  And he had good  response to Symbicort  PFTs reviewed with normal lung function  Atypical shoulder, chest pain Pain is likely musculoskeletal in origin.  However given patient concerns we will get a CT angiogram of the chest  Plan/Recommendations: Continue Symbicort CTA of the chest  Marshell Garfinkel MD [facility] 12/14/2021, 3:15 PM  CC: Samuel Bouche, NP

## 2021-12-15 ENCOUNTER — Ambulatory Visit (INDEPENDENT_AMBULATORY_CARE_PROVIDER_SITE_OTHER)
Admission: RE | Admit: 2021-12-15 | Discharge: 2021-12-15 | Disposition: A | Discharge status: Home / Self Care | Payer: BC Managed Care – PPO | Source: Ambulatory Visit | Attending: Pulmonary Disease | Admitting: Pulmonary Disease

## 2021-12-15 DIAGNOSIS — R0602 Shortness of breath: Secondary | ICD-10-CM | POA: Diagnosis not present

## 2021-12-15 DIAGNOSIS — R0789 Other chest pain: Secondary | ICD-10-CM | POA: Diagnosis not present

## 2021-12-15 MED ORDER — IOHEXOL 350 MG/ML SOLN
80.0000 mL | Freq: Once | INTRAVENOUS | Status: AC | PRN
  Administered 2021-12-15: 80 mL via INTRAVENOUS

## 2021-12-20 ENCOUNTER — Encounter: Payer: Self-pay | Admitting: Pulmonary Disease

## 2021-12-23 ENCOUNTER — Ambulatory Visit (INDEPENDENT_AMBULATORY_CARE_PROVIDER_SITE_OTHER): Payer: BC Managed Care – PPO | Admitting: Medical-Surgical

## 2021-12-23 ENCOUNTER — Encounter: Payer: Self-pay | Admitting: Medical-Surgical

## 2021-12-23 ENCOUNTER — Other Ambulatory Visit: Payer: Self-pay

## 2021-12-23 VITALS — BP 113/76 | HR 63 | Wt 170.1 lb

## 2021-12-23 DIAGNOSIS — Z0000 Encounter for general adult medical examination without abnormal findings: Secondary | ICD-10-CM

## 2021-12-23 DIAGNOSIS — Z1329 Encounter for screening for other suspected endocrine disorder: Secondary | ICD-10-CM

## 2021-12-23 DIAGNOSIS — F32A Depression, unspecified: Secondary | ICD-10-CM

## 2021-12-23 DIAGNOSIS — F419 Anxiety disorder, unspecified: Secondary | ICD-10-CM

## 2021-12-23 MED ORDER — LEVOCETIRIZINE DIHYDROCHLORIDE 5 MG PO TABS: 5.0000 mg | ORAL_TABLET | Freq: Every evening | ORAL | 1 refills | Status: DC

## 2021-12-23 MED ORDER — LIDOCAINE 5 % EX PTCH: MEDICATED_PATCH | CUTANEOUS | 6 refills | Status: DC

## 2021-12-23 NOTE — Progress Notes (Signed)
HPI: Larry Mercado is a 41 y.o. male who  has a past medical history of Allergic rhinitis, Attention deficit, Chronic back pain, Dyslipidemia, Dyspepsia, GERD (gastroesophageal reflux disease), Groin pain, right lower quadrant, Hemorrhoids, Herniated cervical disc, IBS (irritable bowel syndrome), Sebaceous cyst, and Vertigo.  he presents to [facility] today, 12/23/21,  for chief complaint of: Annual physical exam  Dentist: UTD Eye exam: Has not gone in years, wears glasses Exercise: Walks at least 30 minutes daily at work Diet: Working on possible elimination to help with GI symptoms COVID vaccine: Done no booster  Concerns: Mood-taking Zoloft but does not feel this is doing extremely well for symptom control.  Has a lot of stress at home including concern for a special needs child.  Unsure about medication adjustment or supplementation at this point.  Open to counseling.  Past medical, surgical, social and family history reviewed:  Patient Active Problem List   Diagnosis Date Noted   Right shoulder pain 10/12/2021   Jejunal intussusception (HCC) 10/12/2021   Anterior chest wall pain 01/22/2021   Gastroesophageal reflux disease without esophagitis 01/22/2021   Labral tear of right hip joint 01/12/2021   Cervical cord myelomalacia (HCC) 12/10/2020   Chronic headache with new features 12/10/2020   Protrusion of cervical intervertebral disc 12/10/2020   Myelomalacia of cervical cord (HCC) 12/10/2020   Lumbar degenerative disc disease 04/14/2017   Bilateral hand pain 01/03/2017   Attention deficit disorder (ADD) in adult 01/03/2017   Tachycardia 01/03/2017   Attention deficit hyperactivity disorder (ADHD) 12/14/2015   Dyspnea on exertion 02/25/2015   Anxiety and depression 02/05/2015   Low serum HDL 02/05/2015   Rosacea 02/05/2015    Past Surgical History:  Procedure Laterality Date   COLONOSCOPY  2012   In [place] he believes but don't  know where. Was told that he had internal hemorrhoids   ESOPHAGOGASTRODUODENOSCOPY  2012   believes it was done same time with colonoscopy   HYDROCELE EXCISION / REPAIR     MOUTH SURGERY     SPERMATIC VEIN LIGATION      Social History   Tobacco Use   Smoking status: Never   Smokeless tobacco: Never  Substance Use Topics   Alcohol use: Not Currently    Comment: rarely     Family History  Problem Relation Age of Onset   Cancer Father        brain   Mitral valve prolapse Father    Heart disease Paternal Grandfather    Alzheimer's disease Maternal Grandmother    Mesothelioma Paternal Grandmother    Prostate cancer Maternal Grandfather    Colon cancer Neg Hx    Esophageal cancer Neg Hx      Current medication list and allergy/intolerance information reviewed:    Current Outpatient Medications  Medication Sig Dispense Refill   albuterol (VENTOLIN HFA) 108 (90 Base) MCG/ACT inhaler INHALE 2 PUFFS INTO THE LUNGS EVERY 6 HOURS AS NEEDED FOR WHEEZING 8.5 g 3   gabapentin (NEURONTIN) 100 MG capsule Take 1-2 capsules (100-200 mg total) by mouth 3 (three) times daily as needed. 180 capsule 1   levocetirizine (XYZAL) 5 MG tablet Take 1 tablet (5 mg total) by mouth every evening. 90 tablet 1   metroNIDAZOLE (METROGEL) 0.75 % gel Apply 1 application topically 2 (two) times daily. 45 g 3   sertraline (ZOLOFT) 50 MG tablet Take 1 tablet (50 mg total) by mouth daily. 30 tablet 3   esomeprazole (NEXIUM) 40 MG capsule  Take 1 capsule (40 mg total) by mouth daily at 12 noon. 30 capsule 3   lidocaine (LIDODERM) 5 % SMARTSIG:Patch(s) Topical 30 patch 6   No current facility-administered medications for this visit.    No Known Allergies     Review of Systems: Constitutional:  No  fever, no chills, No recent illness, No unintentional weight changes. + significant fatigue.  HEENT: No  headache, no vision change, no hearing change, No sore throat, No  sinus pressure Cardiac: + Right-sided  chest pain (chronic), No  pressure, No palpitations, No  Orthopnea Respiratory:  + Intermittent shortness of breath (chronic). No  Cough Gastrointestinal: No  abdominal pain, No  nausea, No  vomiting,  No  blood in stool, No  diarrhea, No  constipation  Musculoskeletal: No new myalgia/arthralgia Skin: No  Rash, No other wounds/concerning lesions Genitourinary: No  incontinence, No  abnormal genital bleeding, No abnormal genital discharge Hem/Onc: No  easy bruising/bleeding, No  abnormal lymph node Endocrine: No cold intolerance,  No heat intolerance. No polyuria/polydipsia/polyphagia  Neurologic: No  weakness, No  dizziness, No  slurred speech/focal weakness/facial droop Psychiatric: + concerns with depression, + concerns with anxiety, No sleep problems, + mood problems  Exam:  BP 113/76    Pulse 63    Wt 170 lb 1.9 oz (77.2 kg)    SpO2 98%    BMI 23.73 kg/m  Constitutional: VS see above. General Appearance: alert, well-developed, well-nourished, NAD Eyes: Normal lids and conjunctive, non-icteric sclera Ears, Nose, Mouth, Throat: MMM, Normal external inspection ears/nares/mouth/lips/gums. TM normal bilaterally.   Neck: No masses, trachea midline. No thyroid enlargement. No tenderness/mass appreciated. No lymphadenopathy Respiratory: Normal respiratory effort. no wheeze, no rhonchi, no rales Cardiovascular: S1/S2 normal, no murmur, no rub/gallop auscultated. RRR. No lower extremity edema. Pedal pulse II/IV bilaterally PT. No carotid bruit or JVD. No abdominal aortic bruit. Gastrointestinal: Nontender, no masses. No hepatomegaly, no splenomegaly. No hernia appreciated. Bowel sounds normal. Rectal exam deferred.  Musculoskeletal: Gait normal. No clubbing/cyanosis of digits.  Neurological: Normal balance/coordination. No tremor. No cranial nerve deficit on limited exam. Motor and sensation intact and symmetric. Cerebellar reflexes intact.  Skin: warm, dry, intact. No rash/ulcer. No concerning nevi  or subq nodules on limited exam.   Psychiatric: Normal judgment/insight. Normal mood and affect. Oriented x3.    ASSESSMENT/PLAN:   1. Annual physical exam Checking labs as below.  Recommend updating eye exam.  Wellness information provided with AVS. - TSH - Lipid panel - COMPLETE METABOLIC PANEL WITH GFR - CBC with Differential/Platelet  2. Thyroid disorder screen Checking thyroid Mercado. - TSH  3. Anxiety and depression Discussed adjusting medications versus counseling or both.  Would like to leave medication as it is.  Start counseling, referral entered. - Ambulatory referral to Behavioral Health   Orders Placed This Encounter  Procedures   TSH   Lipid panel   COMPLETE METABOLIC PANEL WITH GFR   CBC with Differential/Platelet   Ambulatory referral to Behavioral Health    Meds ordered this encounter  Medications   lidocaine (LIDODERM) 5 %    Sig: SMARTSIG:Patch(s) Topical    Dispense:  30 patch    Refill:  6    Order Specific Question:   Supervising Provider    Answer:   MATTHEWS, CODY [[id number]]   levocetirizine (XYZAL) 5 MG tablet    Sig: Take 1 tablet (5 mg total) by mouth every evening.    Dispense:  90 tablet    Refill:  1  Order Specific Question:   Supervising Provider    Answer:   Patrcia Dolly    Patient Instructions  Preventive Care 56-73 Years Old, Male Preventive care refers to lifestyle choices and visits with your health care provider that can promote health and wellness. Preventive care visits are also called wellness exams. What can I expect for my preventive care visit? Counseling During your preventive care visit, your health care provider may ask about your: Medical history, including: Past medical problems. Family medical history. Current health, including: Emotional well-being. Home life and relationship well-being. Sexual activity. Lifestyle, including: Alcohol, nicotine or tobacco, and drug use. Access to firearms. Diet,  exercise, and sleep habits. Safety issues such as seatbelt and bike helmet use. Sunscreen use. Work and work Astronomer. Physical exam Your health care provider may check your: Height and weight. These may be used to calculate your BMI (body mass index). BMI is a measurement that tells if you are at a healthy weight. Waist circumference. This measures the distance around your waistline. This measurement also tells if you are at a healthy weight and may help predict your risk of certain diseases, such as type 2 diabetes and high blood pressure. Heart rate and blood pressure. Body temperature. Skin for abnormal spots. What immunizations do I need? Vaccines are usually given at various ages, according to a schedule. Your health care provider will recommend vaccines for you based on your age, medical history, and lifestyle or other factors, such as travel or where you work. What tests do I need? Screening Your health care provider may recommend screening tests for certain conditions. This may include: Lipid and cholesterol Mercado. Diabetes screening. This is done by checking your blood sugar (glucose) after you have not eaten for a while (fasting). Hepatitis B test. Hepatitis C test. HIV (human immunodeficiency virus) test. STI (sexually transmitted infection) testing, if you are at risk. Talk with your health care provider about your test results, treatment options, and if necessary, the need for more tests. Follow these instructions at home: Eating and drinking  Eat a healthy diet that includes fresh fruits and vegetables, whole grains, lean protein, and low-fat dairy products. Drink enough fluid to keep your urine pale yellow. Take vitamin and mineral supplements as recommended by your health care provider. Do not drink alcohol if your health care provider tells you not to drink. If you drink alcohol: Limit how much you have to 0-2 drinks a day. Know how much alcohol is in your drink.  In the [place] one drink equals one 12 oz bottle of beer (355 mL), one 5 oz glass of wine (148 mL), or one 1 oz glass of hard liquor (44 mL). Lifestyle Brush your teeth every morning and night with fluoride toothpaste. Floss one time each day. Exercise for at least 30 minutes 5 or more days each week. Do not use any products that contain nicotine or tobacco. These products include cigarettes, chewing tobacco, and vaping devices, such as e-cigarettes. If you need help quitting, ask your health care provider. Do not use drugs. If you are sexually active, practice safe sex. Use a condom or other form of protection to prevent STIs. Find healthy ways to manage stress, such as: Meditation, yoga, or listening to music. Journaling. Talking to a trusted person. Spending time with friends and family. Minimize exposure to UV radiation to reduce your risk of skin cancer. Safety Always wear your seat belt while driving or riding in a vehicle. Do not drive: If  you have been drinking alcohol. Do not ride with someone who has been drinking. If you have been using any mind-altering substances or drugs. While texting. When you are tired or distracted. Wear a helmet and other protective equipment during sports activities. If you have firearms in your house, make sure you follow all gun safety procedures. Seek help if you have been physically or sexually abused. What's next? Go to your health care provider once a year for an annual wellness visit. Ask your health care provider how often you should have your eyes and teeth checked. Stay up to date on all vaccines. This information is not intended to replace advice given to you by your health care provider. Make sure you discuss any questions you have with your health care provider. Document Revised: 04/14/2021 Document Reviewed: 04/14/2021 Elsevier Patient Education  2022 [employer].    Follow-up plan: Return in about 1 year (around 12/23/2022) for  annual physical exam.  Thayer Ohm, DNP, APRN, FNP-BC [facility]

## 2021-12-23 NOTE — Patient Instructions (Signed)
Preventive Care 21-39 Years Old, Male ?Preventive care refers to lifestyle choices and visits with your health care provider that can promote health and wellness. Preventive care visits are also called wellness exams. ?What can I expect for my preventive care visit? ?Counseling ?During your preventive care visit, your health care provider may ask about your: ?Medical history, including: ?Past medical problems. ?Family medical history. ?Current health, including: ?Emotional well-being. ?Home life and relationship well-being. ?Sexual activity. ?Lifestyle, including: ?Alcohol, nicotine or tobacco, and drug use. ?Access to firearms. ?Diet, exercise, and sleep habits. ?Safety issues such as seatbelt and bike helmet use. ?Sunscreen use. ?Work and work environment. ?Physical exam ?Your health care provider may check your: ?Height and weight. These may be used to calculate your BMI (body mass index). BMI is a measurement that tells if you are at a healthy weight. ?Waist circumference. This measures the distance around your waistline. This measurement also tells if you are at a healthy weight and may help predict your risk of certain diseases, such as type 2 diabetes and high blood pressure. ?Heart rate and blood pressure. ?Body temperature. ?Skin for abnormal spots. ?What immunizations do I need? ?Vaccines are usually given at various ages, according to a schedule. Your health care provider will recommend vaccines for you based on your age, medical history, and lifestyle or other factors, such as travel or where you work. ?What tests do I need? ?Screening ?Your health care provider may recommend screening tests for certain conditions. This may include: ?Lipid and cholesterol levels. ?Diabetes screening. This is done by checking your blood sugar (glucose) after you have not eaten for a while (fasting). ?Hepatitis B test. ?Hepatitis C test. ?HIV (human immunodeficiency virus) test. ?STI (sexually transmitted infection)  testing, if you are at risk. ?Talk with your health care provider about your test results, treatment options, and if necessary, the need for more tests. ?Follow these instructions at home: ?Eating and drinking ? ?Eat a healthy diet that includes fresh fruits and vegetables, whole grains, lean protein, and low-fat dairy products. ?Drink enough fluid to keep your urine pale yellow. ?Take vitamin and mineral supplements as recommended by your health care provider. ?Do not drink alcohol if your health care provider tells you not to drink. ?If you drink alcohol: ?Limit how much you have to 0-2 drinks a day. ?Know how much alcohol is in your drink. In the [place] one drink equals one 12 oz bottle of beer (355 mL), one 5 oz glass of wine (148 mL), or one 1? oz glass of hard liquor (44 mL). ?Lifestyle ?Brush your teeth every morning and night with fluoride toothpaste. Floss one time each day. ?Exercise for at least 30 minutes 5 or more days each week. ?Do not use any products that contain nicotine or tobacco. These products include cigarettes, chewing tobacco, and vaping devices, such as e-cigarettes. If you need help quitting, ask your health care provider. ?Do not use drugs. ?If you are sexually active, practice safe sex. Use a condom or other form of protection to prevent STIs. ?Find healthy ways to manage stress, such as: ?Meditation, yoga, or listening to music. ?Journaling. ?Talking to a trusted person. ?Spending time with friends and family. ?Minimize exposure to UV radiation to reduce your risk of skin cancer. ?Safety ?Always wear your seat belt while driving or riding in a vehicle. ?Do not drive: ?If you have been drinking alcohol. Do not ride with someone who has been drinking. ?If you have been using any mind-altering substances or   drugs. ?While texting. ?When you are tired or distracted. ?Wear a helmet and other protective equipment during sports activities. ?If you have firearms in your house, make sure you  follow all gun safety procedures. ?Seek help if you have been physically or sexually abused. ?What's next? ?Go to your health care provider once a year for an annual wellness visit. ?Ask your health care provider how often you should have your eyes and teeth checked. ?Stay up to date on all vaccines. ?This information is not intended to replace advice given to you by your health care provider. Make sure you discuss any questions you have with your health care provider. ?Document Revised: 04/14/2021 Document Reviewed: 04/14/2021 ?Elsevier Patient Education ? 2022 Elsevier Inc. ? ?

## 2021-12-24 LAB — COMPLETE METABOLIC PANEL WITH GFR
AG Ratio: 1.8 (calc) (ref 1.0–2.5)
ALT: 27 U/L (ref 9–46)
AST: 27 U/L (ref 10–40)
Albumin: 4.5 g/dL (ref 3.6–5.1)
Alkaline phosphatase (APISO): 82 U/L (ref 36–130)
BUN: 16 mg/dL (ref 7–25)
CO2: 31 mmol/L (ref 20–32)
Calcium: 10.1 mg/dL (ref 8.6–10.3)
Chloride: 102 mmol/L (ref 98–110)
Creat: 1.14 mg/dL (ref 0.60–1.26)
Globulin: 2.5 g/dL (calc) (ref 1.9–3.7)
Glucose, Bld: 99 mg/dL (ref 65–99)
Potassium: 4.5 mmol/L (ref 3.5–5.3)
Sodium: 139 mmol/L (ref 135–146)
Total Bilirubin: 0.5 mg/dL (ref 0.2–1.2)
Total Protein: 7 g/dL (ref 6.1–8.1)
eGFR: 84 mL/min/{1.73_m2} (ref >=60)

## 2021-12-24 LAB — CBC WITH DIFFERENTIAL/PLATELET
Absolute Monocytes: 467 cells/uL (ref 200–950)
Basophils Absolute: 29 cells/uL (ref 0–200)
Basophils Relative: 0.5 %
Eosinophils Absolute: 291 cells/uL (ref 15–500)
Eosinophils Relative: 5.1 %
HCT: 49.4 % (ref 38.5–50.0)
Hemoglobin: 16.4 g/dL (ref 13.2–17.1)
Lymphs Abs: 1203 cells/uL (ref 850–3900)
MCH: 30.1 pg (ref 27.0–33.0)
MCHC: 33.2 g/dL (ref 32.0–36.0)
MCV: 90.8 fL (ref 80.0–100.0)
MPV: 10.8 fL (ref 7.5–12.5)
Monocytes Relative: 8.2 %
Neutro Abs: 3711 cells/uL (ref 1500–7800)
Neutrophils Relative %: 65.1 %
Platelets: 207 10*3/uL (ref 140–400)
RBC: 5.44 10*6/uL (ref 4.20–5.80)
RDW: 12.3 % (ref 11.0–15.0)
Total Lymphocyte: 21.1 %
WBC: 5.7 10*3/uL (ref 3.8–10.8)

## 2021-12-24 LAB — LIPID PANEL
Cholesterol: 173 mg/dL (ref <200)
HDL: 40 mg/dL (ref >=40)
LDL Cholesterol (Calc): 111 mg/dL (calc) — ABNORMAL HIGH
Non-HDL Cholesterol (Calc): 133 mg/dL (calc) — ABNORMAL HIGH (ref <130)
Total CHOL/HDL Ratio: 4.3 (calc) (ref <5.0)
Triglycerides: 112 mg/dL (ref <150)

## 2021-12-24 LAB — TSH: TSH: 0.91 mIU/L (ref 0.40–4.50)

## 2021-12-28 ENCOUNTER — Other Ambulatory Visit: Payer: Self-pay | Admitting: Medical-Surgical

## 2021-12-29 ENCOUNTER — Other Ambulatory Visit: Payer: Self-pay

## 2021-12-29 ENCOUNTER — Ambulatory Visit (INDEPENDENT_AMBULATORY_CARE_PROVIDER_SITE_OTHER): Payer: BC Managed Care – PPO

## 2021-12-29 ENCOUNTER — Ambulatory Visit: Payer: BC Managed Care – PPO | Admitting: Sports Medicine

## 2021-12-29 DIAGNOSIS — G8929 Other chronic pain: Secondary | ICD-10-CM

## 2021-12-29 DIAGNOSIS — M25511 Pain in right shoulder: Secondary | ICD-10-CM | POA: Diagnosis not present

## 2021-12-29 NOTE — Assessment & Plan Note (Signed)
MR arthrography showed mostly cuff tendinosis, the steroid part of the glenohumeral joint injection did not help, symptoms were more subacromial so we did a subacromial injection today, return in a month, referral to orthopedic surgery if not better. ?

## 2021-12-29 NOTE — Progress Notes (Signed)
? ? ?  Procedures performed today:   ? ?Procedure: Real-time Ultrasound Guided injection of the right subacromial bursa ?Device: Samsung HS60  ?Verbal informed consent obtained.  ?Time-out conducted.  ?Noted no overlying erythema, induration, or other signs of local infection.  ?Skin prepped in a sterile fashion.  ?Local anesthesia: Topical Ethyl chloride.  ?With sterile technique and under real time ultrasound guidance: Noted intact cuff, 1 cc kenalog 40, 1 cc lidocaine, 1 cc bupivacaine injected easily. ?Completed without difficulty  ?Advised to call if fevers/chills, erythema, induration, drainage, or persistent bleeding.  ?Images permanently stored and available for review in PACS.  ?Impression: Technically successful ultrasound guided injection. ? ?Independent interpretation of notes and tests performed by another provider:  ? ?None. ? ?Brief History, Exam, Impression, and Recommendations:   ? ?Right shoulder pain ?MR arthrography showed mostly cuff tendinosis, the steroid part of the glenohumeral joint injection did not help, symptoms were more subacromial so we did a subacromial injection today, return in a month, referral to orthopedic surgery if not better. ? ? ? ?___________________________________________ ?Ihor Austin. Benjamin Stain, M.D., ABFM., CAQSM. ?Primary Care and Sports Medicine ?[facility] ? ?Adjunct Instructor of Family Medicine  ?[employer] ?

## 2022-01-10 ENCOUNTER — Other Ambulatory Visit: Payer: Self-pay | Admitting: Medical-Surgical

## 2022-01-18 DIAGNOSIS — M9903 Segmental and somatic dysfunction of lumbar region: Secondary | ICD-10-CM | POA: Diagnosis not present

## 2022-01-18 DIAGNOSIS — M542 Cervicalgia: Secondary | ICD-10-CM | POA: Diagnosis not present

## 2022-01-18 DIAGNOSIS — M9902 Segmental and somatic dysfunction of thoracic region: Secondary | ICD-10-CM | POA: Diagnosis not present

## 2022-01-18 DIAGNOSIS — M9901 Segmental and somatic dysfunction of cervical region: Secondary | ICD-10-CM | POA: Diagnosis not present

## 2022-01-26 ENCOUNTER — Ambulatory Visit: Payer: BC Managed Care – PPO | Admitting: Sports Medicine

## 2022-01-26 DIAGNOSIS — M25511 Pain in right shoulder: Secondary | ICD-10-CM | POA: Diagnosis not present

## 2022-01-26 DIAGNOSIS — G8929 Other chronic pain: Secondary | ICD-10-CM | POA: Diagnosis not present

## 2022-01-26 DIAGNOSIS — G9589 Other specified diseases of spinal cord: Secondary | ICD-10-CM | POA: Diagnosis not present

## 2022-01-26 MED ORDER — GABAPENTIN 600 MG PO TABS: 600.0000 mg | ORAL_TABLET | Freq: Two times a day (BID) | ORAL | 3 refills | Status: DC

## 2022-01-26 NOTE — Progress Notes (Signed)
? ? ?  Procedures performed today:   ? ?None. ? ?Independent interpretation of notes and tests performed by another provider:  ? ?None. ? ?Brief History, Exam, Impression, and Recommendations:   ? ?Right shoulder pain ?Pleasant 41 year old male, we have been treating him for his right shoulder pain, we did an MR arthrogram after failure of physical therapy, that showed mostly rotator cuff tendinosis, I did put some steroid in the arthrogram injection thus the glenohumeral injection component did not help his pain, at the last visit we did a subacromial injection, he also had no improvement. ?At this point I think he is a candidate for discussion of arthroscopic debridement, referral to Dr. Everardo Pacific. ? ?Cervical cord myelomalacia (HCC) ?Larry Mercado does have neck pain with radiation into the periscapular region, on a previous cervical spine MRI it was noted he had some myelomalacia at a couple of levels. ?We added another MRI with and without IV contrast to rule out multiple sclerosis or transverse myelitis, the updated MRI did show some mild C4-C5 DDD, there was also stable T2 signal in the cord on the right from C4-C6. ?The stability of the T2 signal is reassuring. ?Gabapentin 300 twice a day was effective, we are going to go up to 600 twice a day. ?I am unsure whether this is contributing to his shoulder discomfort, this is another reason I would like Dr. Everardo Pacific to weigh in. ? ?Chronic process with worsening and pharmacologic intervention. ? ?___________________________________________ ?Larry Mercado. Benjamin Stain, M.D., ABFM., CAQSM. ?Primary Care and Sports Medicine ?[facility] ? ?Adjunct Instructor of Family Medicine  ?[employer] ?

## 2022-01-26 NOTE — Assessment & Plan Note (Signed)
Pleasant 41 year old male, we have been treating him for his right shoulder pain, we did an MR arthrogram after failure of physical therapy, that showed mostly rotator cuff tendinosis, I did put some steroid in the arthrogram injection thus the glenohumeral injection component did not help his pain, at the last visit we did a subacromial injection, he also had no improvement. ?At this point I think he is a candidate for discussion of arthroscopic debridement, referral to Dr. Everardo Pacific. ?

## 2022-01-26 NOTE — Assessment & Plan Note (Signed)
Larry Mercado does have neck pain with radiation into the periscapular region, on a previous cervical spine MRI it was noted he had some myelomalacia at a couple of levels. ?We added another MRI with and without IV contrast to rule out multiple sclerosis or transverse myelitis, the updated MRI did show some mild C4-C5 DDD, there was also stable T2 signal in the cord on the right from C4-C6. ?The stability of the T2 signal is reassuring. ?Gabapentin 300 twice a day was effective, we are going to go up to 600 twice a day. ?I am unsure whether this is contributing to his shoulder discomfort, this is another reason I would like Dr. Everardo Pacific to weigh in. ?

## 2022-01-28 DIAGNOSIS — M25511 Pain in right shoulder: Secondary | ICD-10-CM | POA: Diagnosis not present

## 2022-02-16 ENCOUNTER — Ambulatory Visit: Payer: BC Managed Care – PPO | Admitting: Pulmonary Disease

## 2022-03-02 ENCOUNTER — Encounter: Payer: Self-pay | Admitting: Sports Medicine

## 2022-03-03 ENCOUNTER — Ambulatory Visit: Payer: BC Managed Care – PPO | Admitting: Medical-Surgical

## 2022-03-03 ENCOUNTER — Telehealth: Payer: Self-pay | Admitting: Medical-Surgical

## 2022-03-03 DIAGNOSIS — G9589 Other specified diseases of spinal cord: Secondary | ICD-10-CM

## 2022-03-03 DIAGNOSIS — R519 Headache, unspecified: Secondary | ICD-10-CM | POA: Diagnosis not present

## 2022-03-03 NOTE — Telephone Encounter (Signed)
Patients wife called and said he is currently at [facility] due to the headaches. He scheduled a f/u appt with you on 5/12. He also wants to let you know that he is ready to have the cervical epidural scheduled.  ?

## 2022-03-04 NOTE — Telephone Encounter (Signed)
Gotcha, I will go ahead and order that cervical epidural now and he can expect a phone call. ?

## 2022-03-04 NOTE — Telephone Encounter (Signed)
Patient was dx with a cluster headache at [facility]. They did do a scan.  ?

## 2022-03-04 NOTE — Telephone Encounter (Signed)
Wife aware that order has been placed and that [facility] will call to schedule.  ?

## 2022-03-04 NOTE — Telephone Encounter (Signed)
I am happy to order it but I really want to see what the outcome of the ED visit is first.  Have his wife let me know before I order his cervical spinal injection. ?

## 2022-03-08 ENCOUNTER — Ambulatory Visit: Payer: BC Managed Care – PPO | Admitting: Family Medicine

## 2022-03-09 ENCOUNTER — Ambulatory Visit
Admission: EM | Admit: 2022-03-09 | Discharge: 2022-03-09 | Disposition: A | Discharge status: Home / Self Care | Payer: BC Managed Care – PPO

## 2022-03-09 DIAGNOSIS — L0100 Impetigo, unspecified: Secondary | ICD-10-CM

## 2022-03-09 DIAGNOSIS — L0102 Bockhart's impetigo: Secondary | ICD-10-CM | POA: Diagnosis not present

## 2022-03-09 MED ORDER — MUPIROCIN 2 % EX OINT: TOPICAL_OINTMENT | CUTANEOUS | 1 refills | Status: DC

## 2022-03-09 NOTE — Discharge Instructions (Addendum)
He has discontinued topical steroids at this time. ? ?Please begin mupirocin ointment, I have sent a prescription to your pharmacy.  Please apply this at least twice daily, more often as you feel it gets rubbed off for one reason or another. ? ?Please switch your bathing soap to an antibacterial soap such as Dial or Hibiclens both of which can be purchased at the pharmacy. ? ?While the lesions do not need to remain covered, please keep in mind that impetigo is very contagious so please do not share hygiene products with anyone. ? ?In the next week you should see significant improvement of these lesions.  Impetigo does not leave permanent scarring.  ? ?If you do not see any improvement of the lesions after 1 week, please return for repeat evaluation. ? ?Thank you for visiting urgent care today.   ?

## 2022-03-09 NOTE — ED Triage Notes (Signed)
Pt c/o rash/ lesions that appeared Monday. He states they have been draining. Patient states he was prescribed medications on Monday that he is currently still taking.  ? ?Pt states he was taking Prednisone (started Friday of last week).  ?

## 2022-03-09 NOTE — ED Provider Notes (Signed)
?[place] ? ? ? ?CSN: [id number] ?Arrival date & time: 03/09/22  1906 ?  ? ?HISTORY  ? ?Chief Complaint  ?Patient presents with  ? Rash  ? ?HPI ?Larry Mercado is a 41 y.o. male. Patient complains of a rash that appeared 2 days ago.  Patient states lesions have been draining a honey colored fluid.  Patient states that the rash is dry and crusty as well.  Patient has a beard, states that he usually trims his beard with an Engineer, maintenance (IT) which he states he does not routinely clean.  Patient states sometimes he also uses a razor that he is pretty sure his wife uses every now and then.  Patient states has been taking all of his currently prescribed medications as prescribed.  Patient states last week he was also taking prednisone but that is complete.  Patient states he had a TeleDoc appointment with the nurse practitioner a few days ago who prescribed triamcinolone ointment for the lesions which he states has made it worse.  Patient states he attempted applying the antibiotic ointment to it earlier today, said that there was only 1 packet in his first-aid kit and it was only thing that he is put on it so far that does not burn. ? ?The history is provided by the patient.  ?Past Medical History:  ?Diagnosis Date  ? Allergic rhinitis   ? Attention deficit   ? Chronic back pain   ? Dyslipidemia   ? Dyspepsia   ? GERD (gastroesophageal reflux disease)   ? Groin pain, right lower quadrant   ? Hemorrhoids   ? internal, per patient  ? Herniated cervical disc   ? IBS (irritable bowel syndrome)   ? Sebaceous cyst   ? Vertigo   ? ?Patient Active Problem List  ? Diagnosis Date Noted  ? Right shoulder pain 10/12/2021  ? 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 Anterior chest wall pain 01/22/2021  ? Gastroesophageal reflux disease without esophagitis 01/22/2021  ? Labral tear of right hip joint 01/12/2021  ? Cervical cord myelomalacia ([place]) 12/10/2020  ? Chronic headache with new features 12/10/2020  ? Lumbar  degenerative disc disease 04/14/2017  ? Bilateral hand pain 01/03/2017  ? Attention deficit disorder (ADD) in adult 01/03/2017  ? Tachycardia 01/03/2017  ? Attention deficit hyperactivity disorder (ADHD) 12/14/2015  ? Dyspnea on exertion 02/25/2015  ? Anxiety and depression 02/05/2015  ? Low serum HDL 02/05/2015  ? Rosacea 02/05/2015  ? ?Past Surgical History:  ?Procedure Laterality Date  ? COLONOSCOPY  2012  ? In [place] he believes but don't know where. Was told that he had internal hemorrhoids  ? ESOPHAGOGASTRODUODENOSCOPY  2012  ? believes it was done same time with colonoscopy  ? HYDROCELE EXCISION / REPAIR    ? MOUTH SURGERY    ? SPERMATIC VEIN LIGATION    ? ? ?Home Medications   ? ?Prior to Admission medications   ?Medication Sig Start Date End Date Taking? Authorizing Provider  ?albuterol (VENTOLIN HFA) 108 (90 Base) MCG/ACT inhaler INHALE 2 PUFFS INTO THE LUNGS EVERY 6 HOURS AS NEEDED FOR WHEEZING 11/05/21   Mannam, Praveen, MD  ?esomeprazole (NEXIUM) 40 MG capsule Take 1 capsule (40 mg total) by mouth daily at 12 noon. 11/15/21 12/15/21  Cirigliano, Vito V, DO  ?gabapentin (NEURONTIN) 600 MG tablet Take 1 tablet (600 mg total) by mouth 2 (two) times daily. 01/26/22   Silverio Decamp, MD  ?levocetirizine (XYZAL) 5 MG tablet Take 1 tablet (5 mg  total) by mouth every evening. 12/23/21   Samuel Bouche, NP  ?lidocaine (LIDODERM) 5 % SMARTSIG:Patch(s) Topical 12/23/21   Samuel Bouche, NP  ?metroNIDAZOLE (METROGEL) 0.75 % gel Apply 1 application topically 2 (two) times daily. 05/12/21   Samuel Bouche, NP  ?sertraline (ZOLOFT) 50 MG tablet TAKE ONE (1) TABLET BY MOUTH EVERY DAY 12/28/21   Samuel Bouche, NP  ? ? ?Family History ?Family History  ?Problem Relation Age of Onset  ? Cancer Father   ?     brain  ? Mitral valve prolapse Father   ? Heart disease Paternal Grandfather   ? Alzheimer's disease Maternal Grandmother   ? Mesothelioma Paternal Grandmother   ? Prostate cancer Maternal Grandfather   ? Colon cancer Neg Hx    ? Esophageal cancer Neg Hx   ? ?Social History ?Social History  ? ?Tobacco Use  ? Smoking status: Never  ? Smokeless tobacco: Never  ?Vaping Use  ? Vaping Use: Never used  ?Substance Use Topics  ? Alcohol use: Not Currently  ?  Comment: rarely   ? Drug use: No  ? ?Allergies   ?Patient has no known allergies. ? ?Review of Systems ?Review of Systems ?Pertinent findings noted in history of present illness.  ? ?Physical Exam ?Triage Vital Signs ?ED Triage Vitals  ?Enc Vitals Group  ?   BP 08/27/21 0827 (!) 147/82  ?   Pulse Rate 08/27/21 0827 72  ?   Resp 08/27/21 0827 18  ?   Temp 08/27/21 0827 98.3 ?F (36.8 ?C)  ?   Temp Source 08/27/21 0827 Oral  ?   SpO2 08/27/21 0827 98 %  ?   Weight --   ?   Height --   ?   Head Circumference --   ?   Peak Flow --   ?   Pain Score 08/27/21 0826 5  ?   Pain Loc --   ?   Pain Edu? --   ?   Excl. in [place]? --   ?No data found. ? ?Updated Vital Signs ?BP 118/80 (BP Location: Right Arm)   Pulse 75   Temp 97.9 ?F (36.6 ?C) (Oral)   Resp 18   SpO2 95%  ? ?Physical Exam ?Vitals and nursing note reviewed.  ?Constitutional:   ?   General: He is not in acute distress. ?   Appearance: Normal appearance. He is not ill-appearing.  ?HENT:  ?   Head: Normocephalic and atraumatic.  ?Eyes:  ?   General: Lids are normal.     ?   Right eye: No discharge.     ?   Left eye: No discharge.  ?   Extraocular Movements: Extraocular movements intact.  ?   Conjunctiva/sclera: Conjunctivae normal.  ?   Right eye: Right conjunctiva is not injected.  ?   Left eye: Left conjunctiva is not injected.  ?Neck:  ?   Trachea: Trachea and phonation normal.  ?Cardiovascular:  ?   Rate and Rhythm: Normal rate and regular rhythm.  ?   Pulses: Normal pulses.  ?   Heart sounds: Normal heart sounds. No murmur heard. ?  No friction rub. No gallop.  ?Pulmonary:  ?   Effort: Pulmonary effort is normal. No accessory muscle usage, prolonged expiration or respiratory distress.  ?   Breath sounds: Normal breath sounds. No stridor,  decreased air movement or transmitted upper airway sounds. No decreased breath sounds, wheezing, rhonchi or rales.  ?Chest:  ?   Chest wall:  No tenderness.  ?Musculoskeletal:     ?   General: Normal range of motion.  ?   Cervical back: Normal range of motion and neck supple. Normal range of motion.  ?Lymphadenopathy:  ?   Cervical: No cervical adenopathy.  ?Skin: ?   General: Skin is warm and dry.  ?   Findings: Lesion (TNTC honey crusted lesions on erythematous base scattered across base of neck, left side of neck behind left ear and at hairline on left side) present. No erythema or rash.  ?Neurological:  ?   General: No focal deficit present.  ?   Mental Status: He is alert and oriented to person, place, and time.  ?Psychiatric:     ?   Mood and Affect: Mood normal.     ?   Behavior: Behavior normal.  ? ? ?Visual Acuity ?Right Eye Distance:   ?Left Eye Distance:   ?Bilateral Distance:   ? ?Right Eye Near:   ?Left Eye Near:    ?Bilateral Near:    ? ?UC Couse / Diagnostics / Procedures:  ?  ?EKG ? ?Radiology ?No results found. ? ?Procedures ?Procedures (including critical care time) ? ?UC Diagnoses / Final Clinical Impressions(s)   ?I have reviewed the triage vital signs and the nursing notes. ? ?Pertinent labs & imaging results that were available during my care of the patient were reviewed by me and considered in my medical decision making (see chart for details).   ? ?Final diagnoses:  ?Impetigo follicularis  ?Impetigo  ? ?Patient advised to apply mupirocin ointment to the lesions.  Patient advised not to share hygiene products.  Patient advised to clean all razors and trimmer with alcohol or other antiseptic cleaner.  Return precautions advised. ? ?ED Prescriptions   ? ? Medication Sig Dispense Auth. Provider  ? mupirocin ointment (BACTROBAN) 2 % Apply to affected areas 2-3 times daily for 7 days or until lesions resolve. 30 g Lynden Oxford Scales, PA-C  ? ?  ? ?PDMP not reviewed this encounter. ? ?Pending  results:  ?Labs Reviewed - No data to display ? ?Medications Ordered in UC: ?Medications - No data to display ? ?Disposition Upon Discharge:  ?Condition: stable for discharge home ?Home: take medications as pres

## 2022-03-11 ENCOUNTER — Ambulatory Visit: Payer: BC Managed Care – PPO | Admitting: Sports Medicine

## 2022-03-14 ENCOUNTER — Other Ambulatory Visit: Payer: Self-pay | Admitting: Gastroenterology

## 2022-03-14 DIAGNOSIS — R1084 Generalized abdominal pain: Secondary | ICD-10-CM

## 2022-03-15 ENCOUNTER — Other Ambulatory Visit: Payer: BC Managed Care – PPO

## 2022-03-17 NOTE — Discharge Instructions (Signed)

## 2022-03-18 ENCOUNTER — Other Ambulatory Visit: Payer: BC Managed Care – PPO

## 2022-03-18 ENCOUNTER — Ambulatory Visit
Admission: RE | Admit: 2022-03-18 | Discharge: 2022-03-18 | Disposition: A | Discharge status: Home / Self Care | Payer: BC Managed Care – PPO | Source: Ambulatory Visit | Attending: Sports Medicine | Admitting: Sports Medicine

## 2022-03-18 DIAGNOSIS — M47812 Spondylosis without myelopathy or radiculopathy, cervical region: Secondary | ICD-10-CM | POA: Diagnosis not present

## 2022-03-18 DIAGNOSIS — G9589 Other specified diseases of spinal cord: Secondary | ICD-10-CM

## 2022-03-18 MED ORDER — IOPAMIDOL (ISOVUE-M 300) INJECTION 61%
1.0000 mL | Freq: Once | INTRAMUSCULAR | Status: AC
  Administered 2022-03-18: 1 mL via EPIDURAL

## 2022-03-18 MED ORDER — TRIAMCINOLONE ACETONIDE 40 MG/ML IJ SUSP (RADIOLOGY)
60.0000 mg | Freq: Once | INTRAMUSCULAR | Status: AC
  Administered 2022-03-18: 60 mg via EPIDURAL

## 2022-03-30 ENCOUNTER — Encounter (INDEPENDENT_AMBULATORY_CARE_PROVIDER_SITE_OTHER): Payer: BC Managed Care – PPO | Admitting: Medical-Surgical

## 2022-03-30 DIAGNOSIS — F419 Anxiety disorder, unspecified: Secondary | ICD-10-CM | POA: Diagnosis not present

## 2022-03-30 DIAGNOSIS — F32A Depression, unspecified: Secondary | ICD-10-CM

## 2022-03-31 MED ORDER — SERTRALINE HCL 100 MG PO TABS: 100.0000 mg | ORAL_TABLET | Freq: Every day | ORAL | 3 refills | Status: DC

## 2022-03-31 NOTE — Telephone Encounter (Signed)

## 2022-04-06 ENCOUNTER — Encounter: Payer: Self-pay | Admitting: Medical-Surgical

## 2022-04-08 ENCOUNTER — Encounter (INDEPENDENT_AMBULATORY_CARE_PROVIDER_SITE_OTHER): Payer: BC Managed Care – PPO | Admitting: Sports Medicine

## 2022-04-08 DIAGNOSIS — G9589 Other specified diseases of spinal cord: Secondary | ICD-10-CM | POA: Diagnosis not present

## 2022-04-08 NOTE — Telephone Encounter (Signed)
I spent 5 total minutes of online digital evaluation and management services in this patient-initiated request for online care. 

## 2022-04-15 NOTE — Telephone Encounter (Signed)
Patient has a video visit scheduled for 6/26. AMUCK

## 2022-04-25 ENCOUNTER — Telehealth (INDEPENDENT_AMBULATORY_CARE_PROVIDER_SITE_OTHER): Payer: BC Managed Care – PPO | Admitting: Medical-Surgical

## 2022-04-25 ENCOUNTER — Encounter: Payer: Self-pay | Admitting: Medical-Surgical

## 2022-04-25 DIAGNOSIS — F419 Anxiety disorder, unspecified: Secondary | ICD-10-CM | POA: Diagnosis not present

## 2022-04-25 DIAGNOSIS — F32A Depression, unspecified: Secondary | ICD-10-CM

## 2022-04-25 MED ORDER — DULOXETINE HCL 30 MG PO CPEP: 30.0000 mg | ORAL_CAPSULE | Freq: Two times a day (BID) | ORAL | 0 refills | Status: DC

## 2022-04-28 ENCOUNTER — Ambulatory Visit
Admission: RE | Admit: 2022-04-28 | Discharge: 2022-04-28 | Disposition: A | Discharge status: Home / Self Care | Payer: BC Managed Care – PPO | Source: Ambulatory Visit | Attending: Sports Medicine | Admitting: Sports Medicine

## 2022-04-28 ENCOUNTER — Inpatient Hospital Stay: Admission: RE | Admit: 2022-04-28 | Payer: BC Managed Care – PPO | Source: Ambulatory Visit

## 2022-04-28 DIAGNOSIS — M47812 Spondylosis without myelopathy or radiculopathy, cervical region: Secondary | ICD-10-CM | POA: Diagnosis not present

## 2022-04-28 DIAGNOSIS — G9589 Other specified diseases of spinal cord: Secondary | ICD-10-CM

## 2022-04-28 MED ORDER — IOPAMIDOL (ISOVUE-M 300) INJECTION 61%
1.0000 mL | Freq: Once | INTRAMUSCULAR | Status: AC
  Administered 2022-04-28: 1 mL via EPIDURAL

## 2022-04-28 MED ORDER — TRIAMCINOLONE ACETONIDE 40 MG/ML IJ SUSP (RADIOLOGY)
60.0000 mg | Freq: Once | INTRAMUSCULAR | Status: AC
  Administered 2022-04-28: 60 mg via EPIDURAL

## 2022-04-28 NOTE — Discharge Instructions (Signed)

## 2022-05-04 ENCOUNTER — Encounter: Payer: Self-pay | Admitting: Medical-Surgical

## 2022-06-13 ENCOUNTER — Other Ambulatory Visit: Payer: Self-pay | Admitting: Gastroenterology

## 2022-06-13 DIAGNOSIS — R1084 Generalized abdominal pain: Secondary | ICD-10-CM

## 2022-07-01 ENCOUNTER — Encounter: Payer: Self-pay | Admitting: Sports Medicine

## 2022-07-01 ENCOUNTER — Ambulatory Visit (INDEPENDENT_AMBULATORY_CARE_PROVIDER_SITE_OTHER): Payer: BC Managed Care – PPO

## 2022-07-01 ENCOUNTER — Ambulatory Visit: Payer: BC Managed Care – PPO | Admitting: Sports Medicine

## 2022-07-01 DIAGNOSIS — K580 Irritable bowel syndrome with diarrhea: Secondary | ICD-10-CM

## 2022-07-01 DIAGNOSIS — M25552 Pain in left hip: Secondary | ICD-10-CM

## 2022-07-01 DIAGNOSIS — M25551 Pain in right hip: Secondary | ICD-10-CM

## 2022-07-01 DIAGNOSIS — K589 Irritable bowel syndrome without diarrhea: Secondary | ICD-10-CM | POA: Insufficient documentation

## 2022-07-01 NOTE — Assessment & Plan Note (Signed)
Pleasant 41 year old male, he has bilateral hip pain, he did have an MR arthrography right hip showing a labral tear he is post arthroscopic debridement. Unfortunately having increasing pain bilaterally. We will get bilateral x-rays, we will also add hip conditioning, I like him to do this for 6 weeks and if persistent discomfort we will get updated MR arthrography.

## 2022-07-01 NOTE — Assessment & Plan Note (Signed)
Larry Mercado also has a history of IBS, anxiety, he is off of his Cymbalta now. Off of gabapentin, overall feels better. Unfortunately is having some increased peristalsis. Occasional nausea. This has been about 2 weeks, he also had some perirectal pain. Rectum is unremarkable to examination, no visible hemorrhoids. He has no melena, hematochezia, hematemesis. He tends to have tenesmus and then needs to stool about 3 times a day particularly when drinking coffee, and sometimes when stressed. I explained IBS, we will have him start with a low FODMAP diet before consideration of GI antispasmodics. He is not interested in going back on an SSRI or an SNRI.

## 2022-07-01 NOTE — Progress Notes (Signed)
    Procedures performed today:    None.  Independent interpretation of notes and tests performed by another provider:   None.  Brief History, Exam, Impression, and Recommendations:    Bilateral hip pain status post right hip labral debridement Pleasant 41 year old male, he has bilateral hip pain, he did have an MR arthrography right hip showing a labral tear he is post arthroscopic debridement. Unfortunately having increasing pain bilaterally. We will get bilateral x-rays, we will also add hip conditioning, I like him to do this for 6 weeks and if persistent discomfort we will get updated MR arthrography.  Irritable bowel syndrome [place] also has a history of IBS, anxiety, he is off of his Cymbalta now. Off of gabapentin, overall feels better. Unfortunately is having some increased peristalsis. Occasional nausea. This has been about 2 weeks, he also had some perirectal pain. Rectum is unremarkable to examination, no visible hemorrhoids. He has no melena, hematochezia, hematemesis. He tends to have tenesmus and then needs to stool about 3 times a day particularly when drinking coffee, and sometimes when stressed. I explained IBS, we will have him start with a low FODMAP diet before consideration of GI antispasmodics. He is not interested in going back on an SSRI or an SNRI.  I spent 30 minutes of total time managing this patient today, this includes chart review, face to face, and non-face to face time.  ____________________________________________ Larry Mercado. Benjamin Stain, M.D., ABFM., CAQSM., AME. Primary Care and Sports Medicine [facility]  Adjunct Professor of Family Medicine  [employer]  Restaurant manager, fast food

## 2022-07-15 ENCOUNTER — Telehealth: Payer: BC Managed Care – PPO | Admitting: Family Medicine

## 2022-07-15 ENCOUNTER — Encounter: Payer: Self-pay | Admitting: Family Medicine

## 2022-07-15 VITALS — Ht 71.0 in | Wt 162.0 lb

## 2022-07-15 DIAGNOSIS — M7918 Myalgia, other site: Secondary | ICD-10-CM | POA: Diagnosis not present

## 2022-07-15 DIAGNOSIS — K219 Gastro-esophageal reflux disease without esophagitis: Secondary | ICD-10-CM

## 2022-07-15 DIAGNOSIS — G8929 Other chronic pain: Secondary | ICD-10-CM

## 2022-07-15 DIAGNOSIS — R1084 Generalized abdominal pain: Secondary | ICD-10-CM

## 2022-07-15 MED ORDER — DICYCLOMINE HCL 10 MG PO CAPS: 10.0000 mg | ORAL_CAPSULE | Freq: Three times a day (TID) | ORAL | 3 refills | Status: DC

## 2022-07-15 MED ORDER — DEXLANSOPRAZOLE 60 MG PO CPDR: 60.0000 mg | DELAYED_RELEASE_CAPSULE | Freq: Every day | ORAL | 3 refills | Status: DC

## 2022-07-15 NOTE — Progress Notes (Unsigned)
Extreme heartburn causing nausea. Side pain moving up and down from armpit to waist.   Saw Dr. Karie Schwalbe, who mentioned diet changes. Changes have helped some but the pain is still there.  Pt unsure if he's seen GI yet.

## 2022-07-17 DIAGNOSIS — M7918 Myalgia, other site: Secondary | ICD-10-CM | POA: Insufficient documentation

## 2022-07-17 NOTE — Assessment & Plan Note (Signed)
Continues to have reflux symptoms.  Has tried multiple PPIs without relief.  Adding Dexilant to see if this works better for him.

## 2022-07-17 NOTE — Assessment & Plan Note (Signed)
Unclear etiology.  Question fibromyalgia.  Has had extensive lab work-up in the past.  We will have him come in to check vitamin D levels to make sure that these are adequate we will check iron panel to be sure no signs of hemochromatosis.

## 2022-07-17 NOTE — Progress Notes (Signed)
Larry Mercado - 41 y.o. male MRN [id number]  Date of birth: Mar 20, 1981   This visit type was conducted due to national recommendations for restrictions regarding the COVID-19 Pandemic (e.g. social distancing).  This format is felt to be most appropriate for this patient at this time.  All issues noted in this document were discussed and addressed.  No physical exam was performed (except for noted visual exam findings with Video Visits).  I discussed the limitations of evaluation and management by telemedicine and the availability of in person appointments. The patient expressed understanding and agreed to proceed.  I connected withNAME@ on 07/17/22 at 11:30 AM EDT by a video enabled telemedicine application and verified that I am speaking with the correct person using two identifiers.  Present at visit: Larry Nutting, DO Larry Mercado   Patient Location: Home [address] [place] [address]   Provider location:   [facility]  Chief Complaint  Patient presents with   Abdominal Pain    HPI  Larry Mercado is a 41 y.o. male who presents via audio/video conferencing for a telehealth visit today.  He has complaint of abdominal pain.  This is chronic in nature.  He has had fairly extensive work-up for this in the past.  Seen most recently by Dr. Dianah Field and recommend that he try dietary change.  He reports he has tried this for a couple weeks but has not really noticed a difference in his symptoms.  He does report increased reflux symptoms that cause nausea.  Previously has tried Protonix, omeprazole and most recently Nexium.  He has had endoscopy with biopsies negative for H. pylori.  This did show some mild gastritis.  He does report having some problems with constipation.  He denies any significant diarrhea.  Additionally he has had chronic musculoskeletal pain with pain in his ribs, hip and back area.  Tried on Cymbalta, amitriptyline and gabapentin in the past but did not tolerate these  well.   ROS:  A comprehensive ROS was completed and negative except as noted per HPI  Past Medical History:  Diagnosis Date   Allergic rhinitis    Attention deficit    Chronic back pain    Dyslipidemia    Dyspepsia    GERD (gastroesophageal reflux disease)    Groin pain, right lower quadrant    Hemorrhoids    internal, per patient   Herniated cervical disc    IBS (irritable bowel syndrome)    Sebaceous cyst    Vertigo     Past Surgical History:  Procedure Laterality Date   COLONOSCOPY  2012   In [place] he believes but don't know where. Was told that he had internal hemorrhoids   ESOPHAGOGASTRODUODENOSCOPY  2012   believes it was done same time with colonoscopy   HYDROCELE EXCISION / [place]      Family History  Problem Relation Age of Onset   Cancer Father        brain   Mitral valve prolapse Father    Heart disease Paternal Grandfather    Alzheimer's disease Maternal Grandmother    Mesothelioma Paternal Grandmother    Prostate cancer Maternal Grandfather    Colon cancer Neg Hx    Esophageal cancer Neg Hx     Social History   Socioeconomic History   Marital status: Married    Spouse name: elizabeth   Number of children: Not on file   Years  of education: Not on file   Highest education level: Not on file  Occupational History   Occupation: Full time  Tobacco Use   Smoking status: Never   Smokeless tobacco: Never  Vaping Use   Vaping Use: Never used  Substance and Sexual Activity   Alcohol use: Not Currently    Comment: rarely    Drug use: No   Sexual activity: Yes  Other Topics Concern   Not on file  Social History Narrative   Lives with wife and 3 children   Right Handed   Drinks 3-4 cups caffeine daily   Social Determinants of Health   Financial Resource Strain: Not on file  Food Insecurity: Not on file  Transportation Needs: Not on file  Physical Activity: Not on file  Stress: Not on file   Social Connections: Not on file  Intimate Partner Violence: Not on file     Current Outpatient Medications:    albuterol (VENTOLIN HFA) 108 (90 Base) MCG/ACT inhaler, INHALE 2 PUFFS INTO THE LUNGS EVERY 6 HOURS AS NEEDED FOR WHEEZING, Disp: 8.5 g, Rfl: 3   dexlansoprazole (DEXILANT) 60 MG capsule, Take 1 capsule (60 mg total) by mouth daily., Disp: 30 capsule, Rfl: 3   dicyclomine (BENTYL) 10 MG capsule, Take 1 capsule (10 mg total) by mouth 3 (three) times daily before meals., Disp: 90 capsule, Rfl: 3   esomeprazole (NEXIUM) 40 MG capsule, TAKE ONE CAPSULE BY MOUTH DAILY AT 12 NOON, Disp: 30 capsule, Rfl: 3   lidocaine (LIDODERM) 5 %, SMARTSIG:Patch(s) Topical, Disp: 30 patch, Rfl: 6   triamcinolone cream (KENALOG) 0.1 %, Apply topically 3 (three) times daily., Disp: , Rfl:   EXAM:  VITALS per patient if applicable: Ht 5\' 11"  (1.803 m)   Wt 162 lb (73.5 kg)   BMI 22.59 kg/m   GENERAL: alert, oriented, appears well and in no acute distress  HEENT: atraumatic, conjunttiva clear, no obvious abnormalities on inspection of external nose and ears  NECK: normal movements of the head and neck  LUNGS: on inspection no signs of respiratory distress, breathing rate appears normal, no obvious gross SOB, gasping or wheezing  CV: no obvious cyanosis  MS: moves all visible extremities without noticeable abnormality  PSYCH/NEURO: pleasant and cooperative, no obvious depression or anxiety, speech and thought processing grossly intact  ASSESSMENT AND PLAN:  Discussed the following assessment and plan:  Gastroesophageal reflux disease without esophagitis Continues to have reflux symptoms.  Has tried multiple PPIs without relief.  Adding Dexilant to see if this works better for him.  Chronic musculoskeletal pain Unclear etiology.  Question fibromyalgia.  Has had extensive lab work-up in the past.  We will have him come in to check vitamin D levels to make sure that these are adequate we will  check iron panel to be sure no signs of hemochromatosis.     I discussed the assessment and treatment plan with the patient. The patient was provided an opportunity to ask questions and all were answered. The patient agreed with the plan and demonstrated an understanding of the instructions.   The patient was advised to call back or seek an in-person evaluation if the symptoms worsen or if the condition fails to improve as anticipated.    , DO

## 2022-07-23 ENCOUNTER — Telehealth: Payer: Self-pay

## 2022-07-23 NOTE — Telephone Encounter (Signed)
PA initiated for Dexilant. ID: [id number]

## 2022-08-19 ENCOUNTER — Ambulatory Visit: Payer: BC Managed Care – PPO | Admitting: Sports Medicine

## 2022-08-19 DIAGNOSIS — M5136 Other intervertebral disc degeneration, lumbar region: Secondary | ICD-10-CM | POA: Diagnosis not present

## 2022-08-19 DIAGNOSIS — M25551 Pain in right hip: Secondary | ICD-10-CM

## 2022-08-19 DIAGNOSIS — Z23 Encounter for immunization: Secondary | ICD-10-CM

## 2022-08-19 DIAGNOSIS — M25552 Pain in left hip: Secondary | ICD-10-CM

## 2022-08-19 DIAGNOSIS — M51369 Other intervertebral disc degeneration, lumbar region without mention of lumbar back pain or lower extremity pain: Secondary | ICD-10-CM

## 2022-08-19 MED ORDER — NORTRIPTYLINE HCL 10 MG PO CAPS: 10.0000 mg | ORAL_CAPSULE | Freq: Every day | ORAL | 3 refills | Status: DC

## 2022-08-19 NOTE — Assessment & Plan Note (Signed)
Larry Mercado does have L5-S1 DDD, he does get occasional back and flank pain, he also has some right lateral chest discomfort that is fairly chronic. He has tried multiple medications, gabapentin seem to work, amitriptyline seem to work, however he had problems with sex drive with gabapentin and amitriptyline created excessive urinary retention. Lyrica was ineffective, Cymbalta ineffective. We will go and try a low-dose of nortriptyline to see how things go. This should also help his abdominal discomfort as well as his hip pain.

## 2022-08-19 NOTE — Addendum Note (Signed)
Addended by: Tarri Glenn A on: 08/19/2022 04:49 PM   Modules accepted: Orders

## 2022-08-19 NOTE — Assessment & Plan Note (Signed)
Larry Mercado returns, he is a very pleasant 41 year old male, he has history of a right hip labral tear post arthroscopic debridement. Did well for a while and then started to have pain bilaterally. We added some hip conditioning and things have improved to some degree. An occasional Lidoderm patch seems to control pain adequately so we will not make any changes from here. Nortriptyline as above also should help. If he does not seem to worsen the next step would be MR arthrography.

## 2022-08-19 NOTE — Progress Notes (Signed)
    Procedures performed today:    None.  Independent interpretation of notes and tests performed by another provider:   None.  Brief History, Exam, Impression, and Recommendations:    Bilateral hip pain status post right hip labral debridement Agostino returns, he is a very pleasant 41 year old male, he has history of a right hip labral tear post arthroscopic debridement. Did well for a while and then started to have pain bilaterally. We added some hip conditioning and things have improved to some degree. An occasional Lidoderm patch seems to control pain adequately so we will not make any changes from here. Nortriptyline as above also should help. If he does not seem to worsen the next step would be MR arthrography.  Lumbar degenerative disc disease Ondre does have L5-S1 DDD, he does get occasional back and flank pain, he also has some right lateral chest discomfort that is fairly chronic. He has tried multiple medications, gabapentin seem to work, amitriptyline seem to work, however he had problems with sex drive with gabapentin and amitriptyline created excessive urinary retention. Lyrica was ineffective, Cymbalta ineffective. We will go and try a low-dose of nortriptyline to see how things go. This should also help his abdominal discomfort as well as his hip pain.    ____________________________________________ Gwen Her. Dianah Field, M.D., ABFM., CAQSM., AME. Primary Care and Sports Medicine [facility]  Adjunct Professor of [place] of [facility] [employer]  Risk manager

## 2022-09-19 ENCOUNTER — Telehealth: Payer: Self-pay | Admitting: Gastroenterology

## 2022-09-19 ENCOUNTER — Encounter: Payer: Self-pay | Admitting: Sports Medicine

## 2022-09-19 ENCOUNTER — Telehealth (INDEPENDENT_AMBULATORY_CARE_PROVIDER_SITE_OTHER): Payer: BC Managed Care – PPO | Admitting: Sports Medicine

## 2022-09-19 DIAGNOSIS — M25552 Pain in left hip: Secondary | ICD-10-CM | POA: Diagnosis not present

## 2022-09-19 DIAGNOSIS — M5136 Other intervertebral disc degeneration, lumbar region: Secondary | ICD-10-CM | POA: Diagnosis not present

## 2022-09-19 DIAGNOSIS — K561 Intussusception: Secondary | ICD-10-CM | POA: Diagnosis not present

## 2022-09-19 DIAGNOSIS — M25551 Pain in right hip: Secondary | ICD-10-CM

## 2022-09-19 DIAGNOSIS — M51369 Other intervertebral disc degeneration, lumbar region without mention of lumbar back pain or lower extremity pain: Secondary | ICD-10-CM

## 2022-09-19 MED ORDER — NORTRIPTYLINE HCL 25 MG PO CAPS: 25.0000 mg | ORAL_CAPSULE | Freq: Every day | ORAL | 11 refills | Status: DC

## 2022-09-19 NOTE — Assessment & Plan Note (Signed)
Larry Mercado is also post right hip labral tear post arthroscopic debridement, starting to have bilateral pain, hip conditioning exercises helped his discomfort to some degree, Lidoderm patches also seem to help to some degree, we added nortriptyline in the hopes of avoiding an additional MR arthrography, we will try a steady up taper in nortriptyline to the max tolerable dose before considering repeat MR arthrography.

## 2022-09-19 NOTE — Telephone Encounter (Signed)
He was evaluated for right-sided abdominal pain earlier this year.  Does have a history of intussusception on CT in 01/2019.  Subsequent MR enterography in 04/2019 was normal.  Unclear if the pain he is describing is related to the previously noted intussusception or overlapping IBS.  Continue with the Bentyl and Nexium as prescribed and agree with close follow-up with polyp as scheduled.  Could potentially consider repeat imaging with CT enterography.

## 2022-09-19 NOTE — Telephone Encounter (Signed)
Pt states he had an appointment with PCP today and was told to contact GI doctor regarding abdominal pain. Pt reports he has had this abdominal pain since August. Pain is on right side and is in lower abdomen and right upper abdomen. Pain goes to pt's lower back. Pt denies nausea and vomiting. Pt reports that in 2019 he had intussusception but pt reports that pain was more in the front of his abdomen than his side and back. Pt describes pain as a dull pain that comes and goes. Pain usually ranges from 5-7/10. Pt also has irregular BMs and has diarrhea once a week. Pt denies red blood in stool but states that sometimes when he wipes he'll see a small amount of pink. Pt currently taking bentyl three times a day with meals for abd pain. Pt also reports he is taking nexium bid but sometimes regurgitates food.  Pt scheduled for appt with Willette Cluster NP on 10/05/22 at 1:30 pm.

## 2022-09-19 NOTE — Telephone Encounter (Signed)
Left detailed voicemail letting pt know to continue bentyl and nexium as prescribed and keep follow up appointment with Gunnar Fusi. Let pt know he can contact us if he has any questions.

## 2022-09-19 NOTE — Progress Notes (Signed)
   Virtual Visit via WebEx/MyChart   I connected with  Kourosh Jablonsky  on 09/19/22 via WebEx/MyChart/Doximity Video and verified that I am speaking with the correct person using two identifiers.   I discussed the limitations, risks, security and privacy concerns of performing an evaluation and management service by WebEx/MyChart/Doximity Video, including the higher likelihood of inaccurate diagnosis and treatment, and the availability of in person appointments.  We also discussed the likely need of an additional face to face encounter for complete and high quality delivery of care.  I also discussed with the patient that there may be a patient responsible charge related to this service. The patient expressed understanding and wishes to proceed.  Provider location is in medical facility. Patient location is at their home, different from provider location. People involved in care of the patient during this telehealth encounter were myself, my nurse/medical assistant, and my front office/scheduling team member.  Review of Systems: No fevers, chills, night sweats, weight loss, chest pain, or shortness of breath.   Objective Findings:    General: Speaking full sentences, no audible heavy breathing.  Sounds alert and appropriately interactive.  Appears well.  Face symmetric.  Extraocular movements intact.  Pupils equal and round.  No nasal flaring or accessory muscle use visualized.  Independent interpretation of tests performed by another provider:   None.  Brief History, Exam, Impression, and Recommendations:    Jejunal intussusception (HCC) Jason does have history of jejunal intussusception, he did see Dr. Barron Alvine with gastroenterology, this was first diagnosed on a CT and subsequent MR enterogram was negative for any masses that could potentially have caused this. He does have persistent right upper quadrant intermittent abdominal pain, I have asked him to discuss this in further detail  with his gastroenterologist.  Lumbar degenerative disc disease Known L5-S1 DDD, occasional back and flank pain. Gabapentin and amitriptyline created excessive urinary retention, Cymbalta and Lyrica were ineffective. We added nortriptyline at 10 mg, he has not had any side effects but has not noticed any improvements at such a low dose, increasing to 25 mg with a 1 month follow-up and then increase to 50 mg if insufficiently efficacious.  Bilateral hip pain status post right hip labral debridement Conway is also post right hip labral tear post arthroscopic debridement, starting to have bilateral pain, hip conditioning exercises helped his discomfort to some degree, Lidoderm patches also seem to help to some degree, we added nortriptyline in the hopes of avoiding an additional MR arthrography, we will try a steady up taper in nortriptyline to the max tolerable dose before considering repeat MR arthrography.  I discussed the above assessment and treatment plan with the patient. The patient was provided an opportunity to ask questions and all were answered. The patient agreed with the plan and demonstrated an understanding of the instructions.   The patient was advised to call back or seek an in-person evaluation if the symptoms worsen or if the condition fails to improve as anticipated.   I provided 30 minutes of face to face and non-face-to-face time during this encounter date, time was needed to gather information, review chart, records, communicate/coordinate with staff remotely, as well as complete documentation.   ____________________________________________ Ihor Austin. Benjamin Stain, M.D., ABFM., CAQSM., AME. Primary Care and Sports Medicine [facility]  Adjunct Professor of Family Medicine  [employer]  Restaurant manager, fast food

## 2022-09-19 NOTE — Assessment & Plan Note (Signed)
Known L5-S1 DDD, occasional back and flank pain. Gabapentin and amitriptyline created excessive urinary retention, Cymbalta and Lyrica were ineffective. We added nortriptyline at 10 mg, he has not had any side effects but has not noticed any improvements at such a low dose, increasing to 25 mg with a 1 month follow-up and then increase to 50 mg if insufficiently efficacious.

## 2022-09-19 NOTE — Telephone Encounter (Signed)
Patient called states he is having abdominal pain seeking advise.

## 2022-09-19 NOTE — Assessment & Plan Note (Signed)
Larry Mercado does have history of jejunal intussusception, he did see Dr. Barron Alvine with gastroenterology, this was first diagnosed on a CT and subsequent MR enterogram was negative for any masses that could potentially have caused this. He does have persistent right upper quadrant intermittent abdominal pain, I have asked him to discuss this in further detail with his gastroenterologist.

## 2022-10-05 ENCOUNTER — Ambulatory Visit: Payer: BC Managed Care – PPO | Admitting: Nurse Practitioner

## 2022-10-10 ENCOUNTER — Ambulatory Visit: Payer: BC Managed Care – PPO | Admitting: Physician Assistant

## 2022-10-19 ENCOUNTER — Encounter (INDEPENDENT_AMBULATORY_CARE_PROVIDER_SITE_OTHER): Payer: BC Managed Care – PPO | Admitting: Sports Medicine

## 2022-10-19 DIAGNOSIS — K561 Intussusception: Secondary | ICD-10-CM

## 2022-10-19 DIAGNOSIS — M5136 Other intervertebral disc degeneration, lumbar region: Secondary | ICD-10-CM

## 2022-10-19 DIAGNOSIS — M51369 Other intervertebral disc degeneration, lumbar region without mention of lumbar back pain or lower extremity pain: Secondary | ICD-10-CM

## 2022-10-19 MED ORDER — NORTRIPTYLINE HCL 50 MG PO CAPS: 50.0000 mg | ORAL_CAPSULE | Freq: Every day | ORAL | 3 refills | Status: DC

## 2022-10-19 NOTE — Telephone Encounter (Signed)
I spent 5 total minutes of online digital evaluation and management services in this patient-initiated request for online care. 

## 2022-10-20 NOTE — Addendum Note (Signed)
Addended by: Monica Becton on: 10/20/2022 01:22 PM   Modules accepted: Orders

## 2022-11-07 ENCOUNTER — Ambulatory Visit (INDEPENDENT_AMBULATORY_CARE_PROVIDER_SITE_OTHER): Payer: BC Managed Care – PPO

## 2022-11-07 DIAGNOSIS — R109 Unspecified abdominal pain: Secondary | ICD-10-CM

## 2022-11-07 DIAGNOSIS — Z8719 Personal history of other diseases of the digestive system: Secondary | ICD-10-CM | POA: Diagnosis not present

## 2022-11-07 DIAGNOSIS — K429 Umbilical hernia without obstruction or gangrene: Secondary | ICD-10-CM | POA: Diagnosis not present

## 2022-11-07 DIAGNOSIS — K561 Intussusception: Secondary | ICD-10-CM

## 2022-11-07 MED ORDER — IOHEXOL 300 MG/ML  SOLN
100.0000 mL | Freq: Once | INTRAMUSCULAR | Status: AC | PRN
  Administered 2022-11-07: 100 mL via INTRAVENOUS

## 2022-11-11 ENCOUNTER — Ambulatory Visit: Payer: BC Managed Care – PPO | Admitting: Gastroenterology

## 2022-11-11 ENCOUNTER — Encounter: Payer: Self-pay | Admitting: Gastroenterology

## 2022-11-11 VITALS — BP 116/84 | HR 96 | Ht 71.0 in | Wt 170.0 lb

## 2022-11-11 DIAGNOSIS — F32A Depression, unspecified: Secondary | ICD-10-CM

## 2022-11-11 DIAGNOSIS — K581 Irritable bowel syndrome with constipation: Secondary | ICD-10-CM

## 2022-11-11 DIAGNOSIS — R12 Heartburn: Secondary | ICD-10-CM | POA: Diagnosis not present

## 2022-11-11 DIAGNOSIS — R1084 Generalized abdominal pain: Secondary | ICD-10-CM | POA: Diagnosis not present

## 2022-11-11 DIAGNOSIS — F419 Anxiety disorder, unspecified: Secondary | ICD-10-CM

## 2022-11-11 NOTE — Progress Notes (Signed)
Chief Complaint:   Abdominal pain  GI History: 42 year old male with a history of allergic rhinitis, ADD, chronic back pain, hyperlipidemia, IBS, GERD, right hip surgery 2019, initially seen in GI clinic 03/2019 for evaluation of intermittent right-sided abdominal discomfort.  No hematochezia, melena.  No known personal or family history of IBD, celiac disease, GI malignancy, liver or pancreaticobiliary disease. - 02/19/2019: CT abdomen/pelvis: Focal intussusception and proximal jejunum measuring 3 cm in length with otherwise normal-appearing abdomen.  No bowel wall thickening - 02/19/2019: Normal CBC, CMP, lipase, UA - 03/28/2019: GI virtual evaluation.  Recommended MR enterography. - 04/08/2019: MR enterography: Normal - 10/18/2021: Evaluated in [facility] for abdominal pain.  Normal labs, EKG - 10/18/2021: RUQ [place]: Normal - 10/18/2021: CT abdomen/pelvis: Normal, small fat-containing umbilical hernia - 1/61/0960: Follow-up in the GI clinic.  Recommended low FODMAP diet, discussed relationship of GI symptoms and underlying anxiety/depression.  Schedule EGD/colonoscopy, further lab evaluation.  Changed Prilosec to Nexium due to breakthrough reflux symptoms. Normal ESR, CRP, porphyria testing  - 11/23/2021: EGD: Candida esophagitis, otherwise normal esophagus, stomach, duodenum.  Treated with fluconazole - 11/23/2021: Colonoscopy: Normal colon, normal TI.  Small internal hemorrhoids.  Repeat in 10 years - 12/23/2021: Normal CBC, CMP, TSH   Longstanding history of GERD.  Index symptoms of heartburn and regurgitation.  Previously controlled with dietary modifications and occasional OTC antacids prn, but eventually started PPI therapy in 2020 with overall improvement.  For his history of IBS, describes years of abdominal discomfort and bloating.  Occasional changes in bowel habits (loose stools).  Has had EGD, colonoscopy, and abdominal US at outside facilities in the past, without any findings.  No records  for review in EMR. Done in approx 2012.  Good response to trial of Bentyl in 2020.  Has otherwise trialed multiple other medications for other issues without much effect on abdominal pain, to include Mobic, Lyrica, gabapentin, menthol patches, lidocaine patches, amitriptyline  HPI:     Patient is a 42 y.o. male presenting to the Gastroenterology Clinic for follow-up.  Last seen by me on 11/15/2021.  Main issue at that time was intermittent R>L abdominal pain.  Also with increased reflux symptoms at that time after changing Protonix to Prilosec 40 mg/day.  Main issue today is continued episodic, generalized R > L abdominal pain and pain in the flanks.  Describes as a dull pain can radiate around the flanks.  No hematochezia.  Has had an extensive evaluation for the same symptoms as outlined above, to include multiple CT scans, MRI enterography, EGD, colonoscopy, ultrasound, along with recent repeat CT last week as below.  -11/07/2022: CT A/P: Subcentimeter right hepatic hypodensity, otherwise normal liver without duct dilatation.  Normal GB, pancreas, spleen.  Normal GI tract without any inflammation, obstruction, intussusception.  No lymphadenopathy or vascular pathology.  Overall normal study.  Additionally, still with HB. Taking Omperazole 40 mg BID.   He takes Bentyl 10 mg TID scheduled. Feels that this does improve sxs overall.   Started OTC stool softener as he has noticed some constipation since starting Pamelor.   Review of systems:     No chest pain, no SOB, no fevers, no urinary sx   Past Medical History:  Diagnosis Date   Allergic rhinitis    Attention deficit    Chronic back pain    Dyslipidemia    Dyspepsia    GERD (gastroesophageal reflux disease)    Groin pain, right lower quadrant    Hemorrhoids    internal,  per patient   Herniated cervical disc    IBS (irritable bowel syndrome)    Sebaceous cyst    Vertigo     Patient's surgical history, family medical history, social  history, medications and allergies were all reviewed in Epic    Current Outpatient Medications  Medication Sig Dispense Refill   albuterol (VENTOLIN HFA) 108 (90 Base) MCG/ACT inhaler INHALE 2 PUFFS INTO THE LUNGS EVERY 6 HOURS AS NEEDED FOR WHEEZING 8.5 g 3   dexlansoprazole (DEXILANT) 60 MG capsule Take 1 capsule (60 mg total) by mouth daily. 30 capsule 3   lidocaine (LIDODERM) 5 % SMARTSIG:Patch(s) Topical 30 patch 6   nortriptyline (PAMELOR) 50 MG capsule Take 1 capsule (50 mg total) by mouth at bedtime. 30 capsule 3   triamcinolone cream (KENALOG) 0.1 % Apply topically 3 (three) times daily.     dicyclomine (BENTYL) 10 MG capsule Take 1 capsule (10 mg total) by mouth 3 (three) times daily before meals. 90 capsule 3   esomeprazole (NEXIUM) 40 MG capsule TAKE ONE CAPSULE BY MOUTH DAILY AT 12 NOON 30 capsule 3   No current facility-administered medications for this visit.    Physical Exam:     BP 116/84   Pulse 96   Ht 5\' 11"  (1.803 m)   Wt 170 lb (77.1 kg)   BMI 23.71 kg/m   GENERAL:  Pleasant male in NAD PSYCH: : Cooperative, normal affect Musculoskeletal:  Normal muscle tone, normal strength NEURO: Alert and oriented x 3, no focal neurologic deficits   IMPRESSION and PLAN:    1) IBS-C 2) Generalized abdominal pain Has had an extensive evaluation for these symptoms as above, to include multiple CT scans, MR enterography, ultrasound, EGD, colonoscopy, labs, along with multiple medication trials.  Discussed diagnosis of IBS at length today with plan for the following:  -Low FODMAP diet -Continue Bentyl -Continue stool softener -Continue adequate hydration -Fiber supplement  3) Heartburn EGD without erosive esophagitis or any significant LES laxity and no hiatal hernia - Offered EM with pH/Mii to eval for EH, FHB, etc vs Bravo, but he prefers to continue with PPI for the time being -Continue omeprazole -Continue avoidance of exacerbating type foods  4) Anxiety and  Depression -Discussed overlap between GI symptomatology and anxiety/depression -Continue Pamelor -Continue close follow-up with PCM   RTC prn  I spent 30 minutes of time, including in depth chart review, independent review of results as outlined above, communicating results with the patient directly, face-to-face time with the patient, coordinating care, and ordering studies and medications as appropriate, and documentation.            Lavena Bullion ,DO, FACG 11/11/2022, 2:23 PM

## 2022-11-11 NOTE — Patient Instructions (Addendum)
Follow up as needed.  Low-FODMAP Eating Plan  FODMAP stands for fermentable oligosaccharides, disaccharides, monosaccharides, and polyols. These are sugars that are hard for some people to digest. A low-FODMAP eating plan may help some people who have irritable bowel syndrome (IBS) and certain other bowel (intestinal) diseases to manage their symptoms. This meal plan can be complicated to follow. Work with a diet and nutrition specialist (dietitian) to make a low-FODMAP eating plan that is right for you. A dietitian can help make sure that you get enough nutrition from this diet. What are tips for following this plan? Reading food labels Check labels for hidden FODMAPs such as: High-fructose syrup. Honey. Agave. Natural fruit flavors. Onion or garlic powder. Choose low-FODMAP foods that contain 3-4 grams of fiber per serving. Check food labels for serving sizes. Eat only one serving at a time to make sure FODMAP levels stay low. Shopping Shop with a list of foods that are recommended on this diet and make a meal plan. Meal planning Follow a low-FODMAP eating plan for up to 6 weeks, or as told by your health care provider or dietitian. To follow the eating plan: Eliminate high-FODMAP foods from your diet completely. Choose only low-FODMAP foods to eat. You will do this for 2-6 weeks. Gradually reintroduce high-FODMAP foods into your diet one at a time. Most people should wait a few days before introducing the next new high-FODMAP food into their meal plan. Your dietitian can recommend how quickly you may reintroduce foods. Keep a daily record of what and how much you eat and drink. Make note of any symptoms that you have after eating. Review your daily record with a dietitian regularly to identify which foods you can eat and which foods you should avoid. General tips Drink enough fluid each day to keep your urine pale yellow. Avoid processed foods. These often have added sugar and may be  high in FODMAPs. Avoid most dairy products, whole grains, and sweeteners. Work with a dietitian to make sure you get enough fiber in your diet. Avoid high FODMAP foods at meals to manage symptoms. Recommended foods Fruits Bananas, oranges, tangerines, lemons, limes, blueberries, raspberries, strawberries, grapes, cantaloupe, honeydew melon, kiwi, papaya, passion fruit, and pineapple. Limited amounts of dried cranberries, banana chips, and shredded coconut. Vegetables Eggplant, zucchini, cucumber, peppers, green beans, bean sprouts, lettuce, arugula, kale, Swiss chard, spinach, collard greens, bok choy, summer squash, potato, and tomato. Limited amounts of corn, carrot, and sweet potato. Green parts of scallions. Grains Gluten-free grains, such as rice, oats, buckwheat, quinoa, corn, polenta, and millet. Gluten-free pasta, bread, or cereal. Rice noodles. Corn tortillas. Meats and other proteins Unseasoned beef, pork, poultry, or fish. Eggs. Berniece Salines. Tofu (firm) and tempeh. Limited amounts of nuts and seeds, such as almonds, walnuts, [place] nuts, pecans, peanuts, nut butters, pumpkin seeds, chia seeds, and sunflower seeds. Dairy Lactose-free milk, yogurt, and kefir. Lactose-free cottage cheese and ice cream. Non-dairy milks, such as almond, coconut, hemp, and rice milk. Non-dairy yogurt. Limited amounts of goat cheese, brie, mozzarella, parmesan, swiss, and other hard cheeses. Fats and oils Butter-free spreads. Vegetable oils, such as olive, canola, and sunflower oil. Seasoning and other foods Artificial sweeteners with names that do not end in "ol," such as aspartame, saccharine, and stevia. Maple syrup, white table sugar, raw sugar, brown sugar, and molasses. Mayonnaise, soy sauce, and tamari. Fresh basil, coriander, parsley, rosemary, and thyme. Beverages Water and mineral water. Sugar-sweetened soft drinks. Small amounts of orange juice or cranberry juice. Black and  green tea. Most dry wines.  Coffee. The items listed above may not be a complete list of foods and beverages you can eat. Contact a dietitian for more information. Foods to avoid Fruits Fresh, dried, and juiced forms of apple, pear, watermelon, peach, plum, cherries, apricots, blackberries, boysenberries, figs, nectarines, and mango. Avocado. Vegetables Chicory root, artichoke, asparagus, cabbage, snow peas, Brussels sprouts, broccoli, sugar snap peas, mushrooms, celery, and cauliflower. Onions, garlic, leeks, and the white part of scallions. Grains Wheat, including kamut, durum, and semolina. Barley and bulgur. Couscous. Wheat-based cereals. Wheat noodles, bread, crackers, and pastries. Meats and other proteins Fried or fatty meat. Sausage. Cashews and pistachios. Soybeans, baked beans, black beans, chickpeas, kidney beans, fava beans, navy beans, lentils, black-eyed peas, and split peas. Dairy Milk, yogurt, ice cream, and soft cheese. Cream and sour cream. Milk-based sauces. Custard. Buttermilk. Soy milk. Seasoning and other foods Any sugar-free gum or candy. Foods that contain artificial sweeteners such as sorbitol, mannitol, isomalt, or xylitol. Foods that contain honey, high-fructose corn syrup, or agave. Bouillon, vegetable stock, beef stock, and chicken stock. Garlic and onion powder. Condiments made with onion, such as hummus, chutney, pickles, relish, salad dressing, and salsa. Tomato paste. Beverages Chicory-based drinks. Coffee substitutes. Chamomile tea. Fennel tea. Sweet or fortified wines such as port or sherry. Diet soft drinks made with isomalt, mannitol, maltitol, sorbitol, or xylitol. Apple, pear, and mango juice. Juices with high-fructose corn syrup. The items listed above may not be a complete list of foods and beverages you should avoid. Contact a dietitian for more information. Summary FODMAP stands for fermentable oligosaccharides, disaccharides, monosaccharides, and polyols. These are sugars that are  hard for some people to digest. A low-FODMAP eating plan is a short-term diet that helps to ease symptoms of certain bowel diseases. The eating plan usually lasts up to 6 weeks. After that, high-FODMAP foods are reintroduced gradually and one at a time. This can help you find out which foods may be causing symptoms. A low-FODMAP eating plan can be complicated. It is best to work with a dietitian who has experience with this type of plan. This information is not intended to replace advice given to you by your health care provider. Make sure you discuss any questions you have with your health care provider. Document Revised: 03/05/2020 Document Reviewed: 03/05/2020 Elsevier Patient Education  2023 Elsevier Inc.  _______________________________________________________  If your blood pressure at your visit was 140/90 or greater, please contact your primary care physician to follow up on this.  _______________________________________________________  If you are age 40 or older, your body mass index should be between 23-30. Your Body mass index is 23.71 kg/m. If this is out of the aforementioned range listed, please consider follow up with your Primary Care Provider.  If you are age 64 or younger, your body mass index should be between 19-25. Your Body mass index is 23.71 kg/m. If this is out of the aformentioned range listed, please consider follow up with your Primary Care Provider.   ________________________________________________________  The [facility] providers would like to encourage you to use [facility] to communicate with providers for non-urgent requests or questions.  Due to long hold times on the telephone, sending your provider a message by [facility] may be a faster and more efficient way to get a response.  Please allow 48 business hours for a response.  Please remember that this is for non-urgent requests.  _______________________________________________________   Due to recent  changes in healthcare laws, you may see  the results of your imaging and laboratory studies on MyChart before your provider has had a chance to review them.  We understand that in some cases there may be results that are confusing or concerning to you. Not all laboratory results come back in the same time frame and the provider may be waiting for multiple results in order to interpret others.  Please give [place] 48 hours in order for your provider to thoroughly review all the results before contacting the office for clarification of your results.     Thank you for choosing me and [facility].  Vito Cirigliano, D.O.

## 2022-11-29 ENCOUNTER — Other Ambulatory Visit: Payer: Self-pay | Admitting: Family Medicine

## 2022-12-15 ENCOUNTER — Ambulatory Visit: Payer: BC Managed Care – PPO | Admitting: Medical-Surgical

## 2022-12-15 ENCOUNTER — Encounter: Payer: Self-pay | Admitting: Medical-Surgical

## 2022-12-15 VITALS — BP 102/67 | HR 94 | Resp 20 | Ht 71.0 in | Wt 168.8 lb

## 2022-12-15 DIAGNOSIS — R0981 Nasal congestion: Secondary | ICD-10-CM

## 2022-12-15 DIAGNOSIS — B349 Viral infection, unspecified: Secondary | ICD-10-CM | POA: Diagnosis not present

## 2022-12-15 DIAGNOSIS — J029 Acute pharyngitis, unspecified: Secondary | ICD-10-CM

## 2022-12-15 LAB — POCT RAPID STREP A (OFFICE): Rapid Strep A Screen: NEGATIVE

## 2022-12-15 LAB — POCT INFLUENZA A/B
Influenza A, POC: NEGATIVE
Influenza B, POC: NEGATIVE

## 2022-12-15 LAB — POC COVID19 BINAXNOW

## 2022-12-15 NOTE — Progress Notes (Signed)
Established Patient Office Visit  Subjective   Patient ID: Larry Mercado, male   DOB: Nov 27, 1980 Age: 42 y.o. MRN: [id number]   Chief Complaint  Patient presents with   Sore Throat   Ear Pain    Patient states that his symptoms started last night with a sore throat and Right ear pain.     HPI Pleasant 42 year old male presenting today for evaluation of upper respiratory symptoms.  Yesterday, he ended up leaving work around 1 PM due to nausea and not feeling well.  He went home and rested and the nausea finally disappeared.  Last night, he noted that his throat was sore.  He was awakened around 3:00 in the morning with worsened sore throat as well as right ear pain.  No recent known contact with any individuals who are sick.  Denies other symptoms.   Objective:    Vitals:   12/15/22 1040  BP: 102/67  Pulse: 94  Resp: 20  Height: 5' 11"$  (1.803 m)  Weight: 168 lb 12.8 oz (76.6 kg)  SpO2: 99%  BMI (Calculated): 23.55    Physical Exam Vitals reviewed.  Constitutional:      General: He is not in acute distress.    Appearance: Normal appearance. He is obese. He is not ill-appearing.  HENT:     Head: Normocephalic.     Right Ear: Tympanic membrane and ear canal normal. No tenderness. No middle ear effusion. Tympanic membrane is not erythematous.     Left Ear: Tympanic membrane and ear canal normal. No tenderness.  No middle ear effusion. Tympanic membrane is not erythematous.     Nose: No congestion or rhinorrhea.     Mouth/Throat:     Mouth: Mucous membranes are moist.     Pharynx: Posterior oropharyngeal erythema present.     Tonsils: No tonsillar exudate or tonsillar abscesses.  Eyes:     Extraocular Movements:     Right eye: Normal extraocular motion.     Left eye: Normal extraocular motion.     Conjunctiva/sclera: Conjunctivae normal.     Pupils: Pupils are equal, round, and reactive to light.  Cardiovascular:     Rate and Rhythm: Normal rate and regular rhythm.      Pulses: Normal pulses.     Heart sounds: Normal heart sounds. No murmur heard.    No friction rub. No gallop.  Pulmonary:     Effort: Pulmonary effort is normal. No respiratory distress.     Breath sounds: Normal breath sounds.  Skin:    General: Skin is warm and dry.  Neurological:     Mental Status: He is alert and oriented to person, place, and time.  Psychiatric:        Mood and Affect: Mood normal.        Behavior: Behavior normal.        Thought Content: Thought content normal.        Judgment: Judgment normal.    Results for orders placed or performed in visit on 12/15/22 (from the past 24 hour(s))  POCT rapid strep A     Status: None   Collection Time: 12/15/22 11:49 AM  Result Value Ref Range   Rapid Strep A Screen Negative Negative  POC COVID-19     Status: None   Collection Time: 12/15/22 11:52 AM  Result Value Ref Range   SARS Coronavirus 2 Ag    POCT Influenza A/B     Status: None   Collection Time: 12/15/22 11:56 AM  Result Value Ref Range   Influenza A, POC Negative Negative   Influenza B, POC Negative Negative       The ASCVD Risk score (Arnett DK, et al., 2019) failed to calculate for the following reasons:   The 2019 ASCVD risk score is only valid for ages 7 to 33   Assessment & Plan:   1. Sore throat 2. Congestion of nasal sinus 3. Viral illness POCT strep, flu, and COVID-negative.  With less than 24 hours of symptoms, suspect this is simply a viral illness that will need to run its course.  Discussed conservative measures.  A list of recommendations for symptom management provided with AVS.  If conservative measures do not manage the sore throat effectively, we can certainly do Magic mouthwash as needed. - POC COVID-19 - POCT rapid strep A - POCT Influenza A/B  Return if symptoms worsen or fail to improve.  ___________________________________________ Clearnce Sorrel, DNP, APRN, FNP-BC Primary Care and [place]

## 2022-12-15 NOTE — Patient Instructions (Addendum)
Tylenol or Ibuprofen for pain Cold or warm fluids Salt water gargles Chloraseptic spray Flonase nasal spray Sucrets lozenges

## 2022-12-26 ENCOUNTER — Ambulatory Visit (INDEPENDENT_AMBULATORY_CARE_PROVIDER_SITE_OTHER): Payer: BC Managed Care – PPO | Admitting: Medical-Surgical

## 2022-12-26 ENCOUNTER — Encounter: Payer: Self-pay | Admitting: Medical-Surgical

## 2022-12-26 VITALS — BP 100/62 | HR 78 | Resp 20 | Ht 71.0 in | Wt 167.6 lb

## 2022-12-26 DIAGNOSIS — Z13220 Encounter for screening for lipoid disorders: Secondary | ICD-10-CM

## 2022-12-26 DIAGNOSIS — Z0000 Encounter for general adult medical examination without abnormal findings: Secondary | ICD-10-CM

## 2022-12-26 MED ORDER — DICYCLOMINE HCL 10 MG PO CAPS: ORAL_CAPSULE | ORAL | 3 refills | Status: DC

## 2022-12-26 NOTE — Progress Notes (Signed)
Complete physical exam  Patient: Larry Mercado   DOB: 1981-07-16   42 y.o. Male  MRN: [id number]  Subjective:    Chief Complaint  Patient presents with   Annual Exam   Abram Plesha is a 42 y.o. male who presents today for a complete physical exam. He reports consuming a general diet.  Walking every day with the dog and during lunch.  He generally feels well. He reports sleeping well. He does not have additional problems to discuss today.    Most recent fall risk assessment:    12/15/2022   10:42 AM  Fall Risk   Falls in the past year? 0  Number falls in past yr: 0  Injury with Fall? 0  Risk for fall due to : No Fall Risks  Follow up Falls evaluation completed     Most recent depression screenings:    12/26/2022    9:10 AM 12/26/2022    8:37 AM  PHQ 2/9 Scores  PHQ - 2 Score 0 0  PHQ- 9 Score 4     Vision:Not within last year , Dental: No current dental problems and Receives regular dental care, and STD: The patient denies history of sexually transmitted disease.    Patient Care Team: Samuel Bouche, NP as PCP - General (Nurse Practitioner) Berniece Salines, DO as PCP - Cardiology (Cardiology) Silverio Decamp, MD as Consulting Physician (Family Medicine) Early, Coralee Pesa, NP as Nurse Practitioner (Nurse Practitioner)   Outpatient Medications Prior to Visit  Medication Sig   albuterol (VENTOLIN HFA) 108 (90 Base) MCG/ACT inhaler INHALE 2 PUFFS INTO THE LUNGS EVERY 6 HOURS AS NEEDED FOR WHEEZING   esomeprazole (NEXIUM) 40 MG capsule TAKE ONE CAPSULE BY MOUTH DAILY AT 12 NOON   lidocaine (LIDODERM) 5 % SMARTSIG:Patch(s) Topical   triamcinolone cream (KENALOG) 0.1 % Apply topically 3 (three) times daily.   [DISCONTINUED] dexlansoprazole (DEXILANT) 60 MG capsule Take 1 capsule (60 mg total) by mouth daily.   [DISCONTINUED] dicyclomine (BENTYL) 10 MG capsule TAKE 1 CAPSULE BY MOUTH THREE TIMES A DAY BEFORE MEALS   [DISCONTINUED] esomeprazole (NEXIUM) 40 MG packet    No  facility-administered medications prior to visit.    Review of Systems  Constitutional:  Negative for chills, fever, malaise/fatigue and weight loss.  HENT:  Negative for congestion, ear pain, hearing loss, sinus pain and sore throat.   Eyes:  Negative for blurred vision, photophobia and pain.  Respiratory:  Negative for cough, shortness of breath and wheezing.   Cardiovascular:  Negative for chest pain, palpitations and leg swelling.  Gastrointestinal:  Negative for abdominal pain, constipation, diarrhea, heartburn, nausea and vomiting.  Genitourinary:  Negative for dysuria, frequency and urgency.  Musculoskeletal:  Negative for falls and neck pain.  Skin:  Negative for itching and rash.  Neurological:  Negative for dizziness, weakness and headaches.  Endo/Heme/Allergies:  Negative for polydipsia. Does not bruise/bleed easily.  Psychiatric/Behavioral:  Negative for depression, substance abuse and suicidal ideas. The patient is not nervous/anxious and does not have insomnia.      Objective:    BP 100/62 (BP Location: Left Arm, Cuff Size: Normal)   Pulse 78   Resp 20   Ht '5\' 11"'$  (1.803 m)   Wt 167 lb 9.6 oz (76 kg)   SpO2 97%   BMI 23.38 kg/m    Physical Exam Constitutional:      General: He is not in acute distress.    Appearance: Normal appearance. He is not ill-appearing.  HENT:  Head: Normocephalic and atraumatic.     Right Ear: Tympanic membrane, ear canal and external ear normal. There is no impacted cerumen.     Left Ear: Tympanic membrane, ear canal and external ear normal. There is no impacted cerumen.     Nose: Nose normal. No congestion or rhinorrhea.     Mouth/Throat:     Mouth: Mucous membranes are moist.     Pharynx: No oropharyngeal exudate or posterior oropharyngeal erythema.  Eyes:     General: No scleral icterus.       Right eye: No discharge.        Left eye: No discharge.     Extraocular Movements: Extraocular movements intact.      Conjunctiva/sclera: Conjunctivae normal.     Pupils: Pupils are equal, round, and reactive to light.  Neck:     Thyroid: No thyromegaly.     Vascular: No carotid bruit or JVD.     Trachea: Trachea normal.  Cardiovascular:     Rate and Rhythm: Normal rate and regular rhythm.     Pulses: Normal pulses.     Heart sounds: Normal heart sounds. No murmur heard.    No friction rub. No gallop.  Pulmonary:     Effort: Pulmonary effort is normal. No respiratory distress.     Breath sounds: Normal breath sounds. No wheezing.  Abdominal:     General: Bowel sounds are normal. There is no distension.     Palpations: Abdomen is soft.     Tenderness: There is no abdominal tenderness. There is no guarding.  Musculoskeletal:        General: Normal range of motion.     Cervical back: Normal range of motion and neck supple.  Lymphadenopathy:     Cervical: No cervical adenopathy.  Skin:    General: Skin is warm and dry.  Neurological:     Mental Status: He is alert and oriented to person, place, and time.     Cranial Nerves: No cranial nerve deficit.  Psychiatric:        Mood and Affect: Mood normal.        Behavior: Behavior normal.        Thought Content: Thought content normal.        Judgment: Judgment normal.   No results found for any visits on 12/26/22.     Assessment & Plan:    Routine Health Maintenance and Physical Exam  Immunization History  Administered Date(s) Administered   Influenza,inj,Quad PF,6+ Mos 11/07/2018, 08/31/2020, 12/14/2021, 08/19/2022   Influenza-Unspecified 08/31/2017   Moderna Sars-Covid-2 Vaccination 03/14/2020, 04/11/2020, 11/01/2020   Tdap 12/15/2017    Health Maintenance  Topic Date Due   DTaP/Tdap/Td (2 - Td or Tdap) 12/16/2027   INFLUENZA VACCINE  Completed   HPV VACCINES  Aged Out   COVID-19 Vaccine  Discontinued   Hepatitis C Screening  Discontinued   HIV Screening  Discontinued    Discussed health benefits of physical activity, and  encouraged him to engage in regular exercise appropriate for his age and condition.  1. Annual physical exam Checking labs as below. UTD on preventative care. Wellness information provided with AVS. - Lipid panel - COMPLETE METABOLIC PANEL WITH GFR - CBC with Differential/Platelet  2. Lipid screening Checking lipids.  - Lipid panel   Return in about 1 day (around 12/27/2022) for annual physical exam or sooner if needed.   Samuel Bouche, NP

## 2022-12-27 LAB — COMPLETE METABOLIC PANEL WITH GFR
AG Ratio: 1.7 (calc) (ref 1.0–2.5)
ALT: 22 U/L (ref 9–46)
AST: 21 U/L (ref 10–40)
Albumin: 4.5 g/dL (ref 3.6–5.1)
Alkaline phosphatase (APISO): 82 U/L (ref 36–130)
BUN: 17 mg/dL (ref 7–25)
CO2: 31 mmol/L (ref 20–32)
Calcium: 9.5 mg/dL (ref 8.6–10.3)
Chloride: 102 mmol/L (ref 98–110)
Creat: 1.01 mg/dL (ref 0.60–1.26)
Globulin: 2.6 g/dL (calc) (ref 1.9–3.7)
Glucose, Bld: 88 mg/dL (ref 65–99)
Potassium: 4.5 mmol/L (ref 3.5–5.3)
Sodium: 141 mmol/L (ref 135–146)
Total Bilirubin: 0.7 mg/dL (ref 0.2–1.2)
Total Protein: 7.1 g/dL (ref 6.1–8.1)
eGFR: 97 mL/min/{1.73_m2} (ref >=60)

## 2022-12-27 LAB — CBC WITH DIFFERENTIAL/PLATELET
Absolute Monocytes: 418 cells/uL (ref 200–950)
Basophils Absolute: 28 cells/uL (ref 0–200)
Basophils Relative: 0.6 %
Eosinophils Absolute: 249 cells/uL (ref 15–500)
Eosinophils Relative: 5.3 %
HCT: 48.8 % (ref 38.5–50.0)
Hemoglobin: 16.4 g/dL (ref 13.2–17.1)
Lymphs Abs: 1170 cells/uL (ref 850–3900)
MCH: 30.1 pg (ref 27.0–33.0)
MCHC: 33.6 g/dL (ref 32.0–36.0)
MCV: 89.7 fL (ref 80.0–100.0)
MPV: 11 fL (ref 7.5–12.5)
Monocytes Relative: 8.9 %
Neutro Abs: 2834 cells/uL (ref 1500–7800)
Neutrophils Relative %: 60.3 %
Platelets: 219 10*3/uL (ref 140–400)
RBC: 5.44 10*6/uL (ref 4.20–5.80)
RDW: 12.5 % (ref 11.0–15.0)
Total Lymphocyte: 24.9 %
WBC: 4.7 10*3/uL (ref 3.8–10.8)

## 2022-12-27 LAB — LIPID PANEL
Cholesterol: 157 mg/dL (ref <200)
HDL: 39 mg/dL — ABNORMAL LOW (ref >=40)
LDL Cholesterol (Calc): 96 mg/dL (calc)
Non-HDL Cholesterol (Calc): 118 mg/dL (calc) (ref <130)
Total CHOL/HDL Ratio: 4 (calc) (ref <5.0)
Triglycerides: 121 mg/dL (ref <150)

## 2023-01-25 ENCOUNTER — Ambulatory Visit: Payer: BC Managed Care – PPO | Admitting: Family Medicine

## 2023-01-25 ENCOUNTER — Encounter: Payer: Self-pay | Admitting: Family Medicine

## 2023-01-25 VITALS — BP 116/74 | HR 75 | Ht 71.0 in | Wt 169.0 lb

## 2023-01-25 DIAGNOSIS — K649 Unspecified hemorrhoids: Secondary | ICD-10-CM | POA: Insufficient documentation

## 2023-01-25 DIAGNOSIS — Z23 Encounter for immunization: Secondary | ICD-10-CM | POA: Diagnosis not present

## 2023-01-25 MED ORDER — HYDROCORTISONE ACETATE 25 MG RE SUPP: 25.0000 mg | Freq: Two times a day (BID) | RECTAL | 2 refills | Status: DC | PRN

## 2023-01-25 NOTE — Assessment & Plan Note (Signed)
Patient presents for discussion on hemorrhoids.  He declined rectal exam today.  Will go ahead and prescribe hydrocortisone suppository to see if we can get him to feel better.  Will also send referral to GI

## 2023-01-25 NOTE — Progress Notes (Signed)
   Acute Office Visit  Subjective:     Patient ID: Larry Mercado, male    DOB: Jan 27, 1981, 42 y.o.   MRN: [id number]  Chief Complaint  Patient presents with   Hemorrhoids    HPI Patient is in today for hemorrhoids.  He has a history of hemorrhoids and has been using Preparation H.  He notes he spoke his sphincter still feels weak and feels like it is still open after a bowel movement.   Review of Systems  Constitutional:  Negative for chills and fever.  Respiratory:  Negative for cough and shortness of breath.   Cardiovascular:  Negative for chest pain.  Neurological:  Negative for headaches.        Objective:    BP 116/74   Pulse 75   Ht 5\' 11"  (1.803 m)   Wt 169 lb (76.7 kg)   SpO2 99%   BMI 23.57 kg/m    Physical Exam Vitals and nursing note reviewed.  Constitutional:      General: He is not in acute distress.    Appearance: Normal appearance.  HENT:     Head: Normocephalic and atraumatic.     Right Ear: External ear normal.     Left Ear: External ear normal.     Nose: Nose normal.  Eyes:     Conjunctiva/sclera: Conjunctivae normal.  Cardiovascular:     Rate and Rhythm: Normal rate.  Pulmonary:     Effort: Pulmonary effort is normal.  Genitourinary:    Comments: Declined rectal exam Neurological:     General: No focal deficit present.     Mental Status: He is alert and oriented to person, place, and time.  Psychiatric:        Mood and Affect: Mood normal.        Behavior: Behavior normal.        Thought Content: Thought content normal.        Judgment: Judgment normal.     No results found for any visits on 01/25/23.      Assessment & Plan:   Problem List Items Addressed This Visit       Cardiovascular and Mediastinum   Hemorrhoids - Primary    Patient presents for discussion on hemorrhoids.  He declined rectal exam today.  Will go ahead and prescribe hydrocortisone suppository to see if we can get him to feel better.  Will also send  referral to GI      Relevant Orders   Ambulatory referral to Gastroenterology    Meds ordered this encounter  Medications   hydrocortisone (ANUSOL-HC) 25 MG suppository    Sig: Place 1 suppository (25 mg total) rectally 2 (two) times daily as needed for hemorrhoids.    Dispense:  24 suppository    Refill:  2    No follow-ups on file.  Owens Loffler, DO

## 2023-02-16 DIAGNOSIS — K649 Unspecified hemorrhoids: Secondary | ICD-10-CM | POA: Diagnosis not present

## 2023-02-16 DIAGNOSIS — K589 Irritable bowel syndrome without diarrhea: Secondary | ICD-10-CM | POA: Diagnosis not present

## 2023-02-16 DIAGNOSIS — K219 Gastro-esophageal reflux disease without esophagitis: Secondary | ICD-10-CM | POA: Diagnosis not present

## 2023-03-15 ENCOUNTER — Encounter (INDEPENDENT_AMBULATORY_CARE_PROVIDER_SITE_OTHER): Payer: BC Managed Care – PPO | Admitting: Medical-Surgical

## 2023-03-15 DIAGNOSIS — M5030 Other cervical disc degeneration, unspecified cervical region: Secondary | ICD-10-CM

## 2023-03-15 NOTE — Telephone Encounter (Signed)
I spent 5 total minutes of online digital evaluation and management services in this patient-initiated request for online care. 

## 2023-03-16 ENCOUNTER — Ambulatory Visit (INDEPENDENT_AMBULATORY_CARE_PROVIDER_SITE_OTHER): Payer: BC Managed Care – PPO | Admitting: Medical-Surgical

## 2023-03-16 VITALS — BP 114/62 | HR 72 | Temp 98.5°F | Ht 71.0 in

## 2023-03-16 DIAGNOSIS — Z23 Encounter for immunization: Secondary | ICD-10-CM

## 2023-03-16 NOTE — Progress Notes (Signed)
   Established Patient Office Visit  Subjective   Patient ID: Larry Mercado, male    DOB: 02-03-1981  Age: 42 y.o. MRN: [id number]  Chief Complaint  Patient presents with   2nd Gardasil 9 vaccine    Initial vaccine was given on 01/25/23. Per [facility] in correct window for second injection.    HPI  2nd Gardasil 9 vaccine. Patient states he did well with initial vaccine without compications.   ROS    Objective:     BP 114/62   Pulse 72   Ht 5\' 11"  (1.803 m)   SpO2 99%   BMI 23.57 kg/m    Physical Exam   No results found for any visits on 03/16/23.    The ASCVD Risk score (Arnett DK, et al., 2019) failed to calculate for the following reasons:   The 2019 ASCVD risk score is only valid for ages 27 to 41    Assessment & Plan:  Gardasil vaccine . Admin Gardasil 9 IM Right deltoid. Patient tolerated injection well without complications. Patient will return on 09/16/23 for last Gardasil-9 vaccine.  Problem List Items Addressed This Visit   None Visit Diagnoses     Need for HPV vaccination    -  Primary   Relevant Orders   HPV 9-valent vaccine,Recombinat (Completed)       Return for Return on 09/16/2023 for last Gardasil 9 vaccine.Elizabeth Palau, LPN

## 2023-03-16 NOTE — Patient Instructions (Signed)
Return on 09/16/2023 for last Gardasil 9 vaccine.

## 2023-03-16 NOTE — Progress Notes (Signed)
Agree with documentation as below.  ___________________________________________ Izzabell Klasen L. Willistine Ferrall, DNP, APRN, FNP-BC Primary Care and Sports Medicine [facility]  

## 2023-03-17 ENCOUNTER — Other Ambulatory Visit: Payer: BC Managed Care – PPO

## 2023-03-21 ENCOUNTER — Ambulatory Visit
Admission: RE | Admit: 2023-03-21 | Discharge: 2023-03-21 | Disposition: A | Discharge status: Home / Self Care | Payer: BC Managed Care – PPO | Source: Ambulatory Visit | Attending: Sports Medicine | Admitting: Sports Medicine

## 2023-03-21 DIAGNOSIS — M542 Cervicalgia: Secondary | ICD-10-CM | POA: Diagnosis not present

## 2023-03-21 DIAGNOSIS — M5030 Other cervical disc degeneration, unspecified cervical region: Secondary | ICD-10-CM | POA: Diagnosis not present

## 2023-03-21 DIAGNOSIS — M25519 Pain in unspecified shoulder: Secondary | ICD-10-CM | POA: Diagnosis not present

## 2023-03-21 DIAGNOSIS — R519 Headache, unspecified: Secondary | ICD-10-CM | POA: Diagnosis not present

## 2023-03-21 DIAGNOSIS — M47812 Spondylosis without myelopathy or radiculopathy, cervical region: Secondary | ICD-10-CM | POA: Diagnosis not present

## 2023-03-21 MED ORDER — IOPAMIDOL (ISOVUE-M 300) INJECTION 61%
1.0000 mL | Freq: Once | INTRAMUSCULAR | Status: AC
  Administered 2023-03-21: 1 mL via EPIDURAL

## 2023-03-21 MED ORDER — TRIAMCINOLONE ACETONIDE 40 MG/ML IJ SUSP (RADIOLOGY)
60.0000 mg | Freq: Once | INTRAMUSCULAR | Status: AC
  Administered 2023-03-21: 60 mg via EPIDURAL

## 2023-03-21 NOTE — Discharge Instructions (Signed)

## 2023-04-03 ENCOUNTER — Ambulatory Visit: Payer: BC Managed Care – PPO | Admitting: Sports Medicine

## 2023-04-03 DIAGNOSIS — G9589 Other specified diseases of spinal cord: Secondary | ICD-10-CM | POA: Diagnosis not present

## 2023-04-03 MED ORDER — TRAZODONE HCL 50 MG PO TABS: 50.0000 mg | ORAL_TABLET | Freq: Every evening | ORAL | 11 refills | Status: DC | PRN

## 2023-04-03 NOTE — Progress Notes (Signed)
    Procedures performed today:    None.  Independent interpretation of notes and tests performed by another provider:   None.  Brief History, Exam, Impression, and Recommendations:    Cervical cord myelomalacia (HCC) Known cervical DDD, has had some epidurals on the left, he still has intermittent pain. He has come off of his gabapentin, this did create excessive side effects, Lyrica not effective, amitriptyline created urinary retention. We will proceed with another epidural, I will also add trazodone to help with insomnia, he can follow this up with his PCP..    ____________________________________________ Ihor Austin. Benjamin Stain, M.D., ABFM., CAQSM., AME. Primary Care and Sports Medicine [facility]  Adjunct Professor of Family Medicine  [employer]  Restaurant manager, fast food

## 2023-04-03 NOTE — Assessment & Plan Note (Signed)
Known cervical DDD, has had some epidurals on the left, he still has intermittent pain. He has come off of his gabapentin, this did create excessive side effects, Lyrica not effective, amitriptyline created urinary retention. We will proceed with another epidural, I will also add trazodone to help with insomnia, he can follow this up with his PCP.Marland Kitchen

## 2023-04-18 NOTE — Discharge Instructions (Signed)

## 2023-04-19 ENCOUNTER — Ambulatory Visit
Admission: RE | Admit: 2023-04-19 | Discharge: 2023-04-19 | Disposition: A | Discharge status: Home / Self Care | Payer: BC Managed Care – PPO | Source: Ambulatory Visit | Attending: Sports Medicine | Admitting: Sports Medicine

## 2023-04-19 DIAGNOSIS — G9589 Other specified diseases of spinal cord: Secondary | ICD-10-CM

## 2023-04-19 DIAGNOSIS — M47812 Spondylosis without myelopathy or radiculopathy, cervical region: Secondary | ICD-10-CM | POA: Diagnosis not present

## 2023-04-19 DIAGNOSIS — M5030 Other cervical disc degeneration, unspecified cervical region: Secondary | ICD-10-CM | POA: Diagnosis not present

## 2023-04-19 DIAGNOSIS — R519 Headache, unspecified: Secondary | ICD-10-CM | POA: Diagnosis not present

## 2023-04-19 DIAGNOSIS — M542 Cervicalgia: Secondary | ICD-10-CM | POA: Diagnosis not present

## 2023-04-19 DIAGNOSIS — M25519 Pain in unspecified shoulder: Secondary | ICD-10-CM | POA: Diagnosis not present

## 2023-04-19 MED ORDER — TRIAMCINOLONE ACETONIDE 40 MG/ML IJ SUSP (RADIOLOGY)
60.0000 mg | Freq: Once | INTRAMUSCULAR | Status: AC
  Administered 2023-04-19: 60 mg via EPIDURAL

## 2023-04-19 MED ORDER — IOPAMIDOL (ISOVUE-M 300) INJECTION 61%
1.0000 mL | Freq: Once | INTRAMUSCULAR | Status: AC | PRN
  Administered 2023-04-19: 1 mL via EPIDURAL

## 2023-05-16 ENCOUNTER — Encounter (INDEPENDENT_AMBULATORY_CARE_PROVIDER_SITE_OTHER): Payer: BC Managed Care – PPO | Admitting: Sports Medicine

## 2023-05-16 DIAGNOSIS — G9589 Other specified diseases of spinal cord: Secondary | ICD-10-CM

## 2023-05-16 DIAGNOSIS — G8929 Other chronic pain: Secondary | ICD-10-CM | POA: Diagnosis not present

## 2023-05-16 DIAGNOSIS — M7918 Myalgia, other site: Secondary | ICD-10-CM | POA: Diagnosis not present

## 2023-05-16 NOTE — Telephone Encounter (Addendum)
I spent 11 total minutes of online digital evaluation and management services in this patient-initiated request for online care. 

## 2023-05-23 MED ORDER — NORTRIPTYLINE HCL 10 MG PO CAPS
ORAL_CAPSULE | ORAL | 3 refills | Status: DC
Start: 2023-05-23 — End: 2023-06-13

## 2023-05-23 NOTE — Addendum Note (Signed)
Addended by: Monica Becton on: 05/23/2023 01:51 PM   Modules accepted: Orders

## 2023-06-12 ENCOUNTER — Encounter (INDEPENDENT_AMBULATORY_CARE_PROVIDER_SITE_OTHER): Payer: BC Managed Care – PPO | Admitting: Sports Medicine

## 2023-06-12 DIAGNOSIS — G9589 Other specified diseases of spinal cord: Secondary | ICD-10-CM

## 2023-06-13 MED ORDER — NORTRIPTYLINE HCL 50 MG PO CAPS: 50.0000 mg | ORAL_CAPSULE | Freq: Every day | ORAL | 11 refills | Status: DC

## 2023-06-13 NOTE — Telephone Encounter (Signed)
I spent 5 total minutes of online digital evaluation and management services in this patient-initiated request for online care. 

## 2023-07-20 ENCOUNTER — Other Ambulatory Visit: Payer: Self-pay | Admitting: Medical-Surgical

## 2023-08-04 ENCOUNTER — Ambulatory Visit (INDEPENDENT_AMBULATORY_CARE_PROVIDER_SITE_OTHER): Payer: BC Managed Care – PPO

## 2023-08-04 DIAGNOSIS — Z23 Encounter for immunization: Secondary | ICD-10-CM | POA: Diagnosis not present

## 2023-08-04 NOTE — Progress Notes (Signed)
Established Patient Office Visit  Subjective   Patient ID: Larry Mercado, male    DOB: [birth year]  Age: 42 y.o. MRN: [id number]  Chief Complaint  Patient presents with   Immunizations    HPI  Larry Mercado is here for last HPV.   ROS    Objective:     There were no vitals taken for this visit.   Physical Exam   No results found for any visits on 08/04/23.    The 10-year ASCVD risk score (Arnett DK, et al., 2019) is: 0.8%    Assessment & Plan:  HPV - Patient tolerated injection well without complications.    Problem List Items Addressed This Visit   None Visit Diagnoses     Immunization due    -  Primary   Relevant Orders   HPV 9-valent vaccine,Recombinat (Completed)       No follow-ups on file.    Esmond Harps, CMA

## 2023-08-22 ENCOUNTER — Other Ambulatory Visit: Payer: Self-pay | Admitting: Pulmonary Disease

## 2023-09-02 ENCOUNTER — Other Ambulatory Visit: Payer: Self-pay | Admitting: Medical-Surgical

## 2023-09-11 ENCOUNTER — Encounter: Payer: Self-pay | Admitting: Sports Medicine

## 2023-09-27 ENCOUNTER — Encounter: Payer: Self-pay | Admitting: Family Medicine

## 2023-09-27 ENCOUNTER — Ambulatory Visit: Payer: BC Managed Care – PPO

## 2023-09-27 ENCOUNTER — Ambulatory Visit: Payer: BC Managed Care – PPO | Admitting: Family Medicine

## 2023-09-27 VITALS — BP 108/62 | HR 70 | Ht 71.0 in | Wt 172.0 lb

## 2023-09-27 DIAGNOSIS — E041 Nontoxic single thyroid nodule: Secondary | ICD-10-CM

## 2023-09-27 DIAGNOSIS — K209 Esophagitis, unspecified without bleeding: Secondary | ICD-10-CM | POA: Diagnosis not present

## 2023-09-27 DIAGNOSIS — Z23 Encounter for immunization: Secondary | ICD-10-CM | POA: Diagnosis not present

## 2023-09-27 DIAGNOSIS — E010 Iodine-deficiency related diffuse (endemic) goiter: Secondary | ICD-10-CM | POA: Diagnosis not present

## 2023-09-27 DIAGNOSIS — K219 Gastro-esophageal reflux disease without esophagitis: Secondary | ICD-10-CM

## 2023-09-27 DIAGNOSIS — M542 Cervicalgia: Secondary | ICD-10-CM

## 2023-09-27 MED ORDER — PANTOPRAZOLE SODIUM 40 MG PO TBEC
40.0000 mg | DELAYED_RELEASE_TABLET | Freq: Two times a day (BID) | ORAL | 1 refills | Status: DC
Start: 2023-09-27 — End: 2023-12-22

## 2023-09-27 NOTE — Assessment & Plan Note (Signed)
Symptoms seem quite significant so we did discuss maximum strength on a PPI.  Will treat with pantoprazole 40 mg twice a day.  If symptoms are improving over the next 2 weeks and okay to stepdown to once a day on the tab.  If not improving then I think he needs further workup with gastroenterology

## 2023-09-27 NOTE — Progress Notes (Signed)
Hi Larry Mercado, the ultrasound of your thyroid shows that you do have a nodule on the left side which felt enlarged on the exam today.  It is measuring about 4.2 cm.  Since it is greater than a centimeter they are recommending that we schedule you for a needle biopsy of the nodule.  We typically schedule that in [place] and they use ultrasound guidance to get a small sample with a needle.  If you are okay with [place] moving forward with this then please let me know and we will get you scheduled.

## 2023-09-27 NOTE — Progress Notes (Signed)
Established Patient Office Visit  Subjective   Patient ID: Larry Mercado, male    DOB: 05/17/1981  Age: 42 y.o. MRN: [id number]  No chief complaint on file.   HPI  Say was having reflux sxs about 2 weeks ago and then better but then stopped.  Now sxs are back. Was taking 2 tab of OTC prilosec and is still on it.  Getting burning sensation in the chest and now he is actually getting some discomfort and pain in his neck initially was more towards the bottom of the anterior neck now it feels like it is just below the jawline.  He is not sure if it is the reflux causing that it is he is never had it because discomfort in his neck before usually it just causes the burning sensation in his chest.  He has had a history of 2 prior endoscopies with the last one being about a year ago.  Last endoscopy with with Dr. Barron Alvine in January 2023.  At that time he had thrush in the esophagus.  And was treated with a round of fluconazole.  The time he was using inhalers pretty regularly.  He has not been doing that lately.    ROS    Objective:     BP 108/62   Pulse 70   Ht 5\' 11"  (1.803 m)   Wt 172 lb (78 kg)   SpO2 100%   BMI 23.99 kg/m    Physical Exam Constitutional:      Appearance: Normal appearance.  HENT:     Head: Normocephalic and atraumatic.     Right Ear: Tympanic membrane, ear canal and external ear normal. There is no impacted cerumen.     Left Ear: Tympanic membrane, ear canal and external ear normal. There is no impacted cerumen.     Nose: Nose normal.     Mouth/Throat:     Pharynx: Oropharynx is clear.  Eyes:     Conjunctiva/sclera: Conjunctivae normal.  Neck:     Comments: Thyroid Feels a little bit more enlarged on the left side compared to the right.  Nontender. Cardiovascular:     Rate and Rhythm: Normal rate and regular rhythm.  Pulmonary:     Effort: Pulmonary effort is normal.     Breath sounds: Normal breath sounds.  Musculoskeletal:     Cervical back:  Neck supple. No tenderness.  Lymphadenopathy:     Cervical: No cervical adenopathy.  Skin:    General: Skin is warm and dry.  Neurological:     Mental Status: He is alert and oriented to person, place, and time.  Psychiatric:        Mood and Affect: Mood normal.      No results found for any visits on 09/27/23.    The 10-year ASCVD risk score (Arnett DK, et al., 2019) is: 0.7%    Assessment & Plan:   Problem List Items Addressed This Visit       Digestive   Gastroesophageal reflux disease without esophagitis    Symptoms seem quite significant so we did discuss maximum strength on a PPI.  Will treat with pantoprazole 40 mg twice a day.  If symptoms are improving over the next 2 weeks and okay to stepdown to once a day on the tab.  If not improving then I think he needs further workup with gastroenterology      Relevant Medications   pantoprazole (PROTONIX) 40 MG tablet   Other Relevant Orders   TSH  US THYROID   CMP14+EGFR   CBC with Differential/Platelet   Other Visit Diagnoses     Encounter for immunization    -  Primary   Relevant Orders   Flu vaccine trivalent PF, 6mos and older(Flulaval,Afluria,Fluarix,Fluzone) (Completed)   Neck pain       Relevant Orders   TSH   US THYROID   CMP14+EGFR   CBC with Differential/Platelet   Thyromegaly       Relevant Orders   TSH   US THYROID   CMP14+EGFR   CBC with Differential/Platelet   Esophagitis       Relevant Orders   TSH   US THYROID   CMP14+EGFR   CBC with Differential/Platelet      Thyroid feels a little bit enlarged on the left side will get ultrasound as well as check a TSH.  No follow-ups on file.    Nani Gasser, MD

## 2023-09-28 LAB — CBC WITH DIFFERENTIAL/PLATELET
Basophils Absolute: 0 10*3/uL (ref 0.0–0.2)
Basos: 1 %
EOS (ABSOLUTE): 0.2 10*3/uL (ref 0.0–0.4)
Eos: 3 %
Hematocrit: 49.8 % (ref 37.5–51.0)
Hemoglobin: 16.5 g/dL (ref 13.0–17.7)
Immature Grans (Abs): 0 10*3/uL (ref 0.0–0.1)
Immature Granulocytes: 0 %
Lymphocytes Absolute: 1.6 10*3/uL (ref 0.7–3.1)
Lymphs: 24 %
MCH: 30.6 pg (ref 26.6–33.0)
MCHC: 33.1 g/dL (ref 31.5–35.7)
MCV: 92 fL (ref 79–97)
Monocytes Absolute: 0.7 10*3/uL (ref 0.1–0.9)
Monocytes: 10 %
Neutrophils Absolute: 4.3 10*3/uL (ref 1.4–7.0)
Neutrophils: 62 %
Platelets: 243 10*3/uL (ref 150–450)
RBC: 5.4 x10E6/uL (ref 4.14–5.80)
RDW: 12.6 % (ref 11.6–15.4)
WBC: 6.8 10*3/uL (ref 3.4–10.8)

## 2023-09-28 LAB — CMP14+EGFR
ALT: 42 [IU]/L (ref 0–44)
AST: 35 [IU]/L (ref 0–40)
Albumin: 4.5 g/dL (ref 4.1–5.1)
Alkaline Phosphatase: 105 [IU]/L (ref 44–121)
BUN/Creatinine Ratio: 11 (ref 9–20)
BUN: 11 mg/dL (ref 6–24)
Bilirubin Total: 0.5 mg/dL (ref 0.0–1.2)
CO2: 24 mmol/L (ref 20–29)
Calcium: 9.6 mg/dL (ref 8.7–10.2)
Chloride: 104 mmol/L (ref 96–106)
Creatinine, Ser: 0.98 mg/dL (ref 0.76–1.27)
Globulin, Total: 2.6 g/dL (ref 1.5–4.5)
Glucose: 84 mg/dL (ref 70–99)
Potassium: 4.4 mmol/L (ref 3.5–5.2)
Sodium: 141 mmol/L (ref 134–144)
Total Protein: 7.1 g/dL (ref 6.0–8.5)
eGFR: 100 mL/min/{1.73_m2} (ref >59)

## 2023-09-28 LAB — TSH: TSH: 0.754 u[IU]/mL (ref 0.450–4.500)

## 2023-10-01 NOTE — Progress Notes (Signed)
Labs are normal and the thyroid is functioning well even with the nodules so that is good.

## 2023-10-26 ENCOUNTER — Other Ambulatory Visit (HOSPITAL_COMMUNITY)
Admission: RE | Admit: 2023-10-26 | Discharge: 2023-10-26 | Disposition: A | Discharge status: Home / Self Care | Payer: BC Managed Care – PPO | Source: Ambulatory Visit | Attending: Interventional Radiology | Admitting: Interventional Radiology

## 2023-10-26 ENCOUNTER — Ambulatory Visit
Admission: RE | Admit: 2023-10-26 | Discharge: 2023-10-26 | Disposition: A | Discharge status: Home / Self Care | Payer: BC Managed Care – PPO | Source: Ambulatory Visit | Attending: Family Medicine | Admitting: Family Medicine

## 2023-10-26 DIAGNOSIS — D34 Benign neoplasm of thyroid gland: Secondary | ICD-10-CM | POA: Insufficient documentation

## 2023-10-26 DIAGNOSIS — E041 Nontoxic single thyroid nodule: Secondary | ICD-10-CM | POA: Diagnosis not present

## 2023-10-30 ENCOUNTER — Encounter: Payer: Self-pay | Admitting: Family Medicine

## 2023-10-30 LAB — CYTOLOGY - NON PAP

## 2023-10-30 NOTE — Progress Notes (Signed)
Biopsy shows benign lesion so no sign of cancer cells which is very reassuring.

## 2023-10-31 ENCOUNTER — Telehealth: Payer: Self-pay

## 2023-10-31 DIAGNOSIS — M542 Cervicalgia: Secondary | ICD-10-CM

## 2023-10-31 NOTE — Telephone Encounter (Signed)
 Called and advised pt of recommendations. He would like to be referred to ENT for this.   Referral started and pended for Dx code. Routed to Dr. Linford Arnold for this.

## 2023-10-31 NOTE — Telephone Encounter (Signed)
Orders Placed This Encounter  Procedures   Ambulatory referral to ENT    Referral Priority:   Routine    Referral Type:   Consultation    Referral Reason:   Specialty Services Required    Requested Specialty:   Otolaryngology    Number of Visits Requested:   1    

## 2023-10-31 NOTE — Telephone Encounter (Signed)
 Copied from CRM [id number]. Topic: Clinical - Medical Advice >> Oct 31, 2023 11:15 AM Franky GRADE wrote: Reason for CRM: Patient received a message for results on Mychart and he understands that it's benign; however, it's still painful and would like to know what are the next steps.

## 2023-10-31 NOTE — Telephone Encounter (Signed)
 The pain that he is experiencing in his neck is completely unrelated to these nodules.  So if he still having some discomfort coming from his chest up into his neck then we can always consider ENT referral.

## 2023-11-03 DIAGNOSIS — E041 Nontoxic single thyroid nodule: Secondary | ICD-10-CM | POA: Diagnosis not present

## 2023-11-03 DIAGNOSIS — K219 Gastro-esophageal reflux disease without esophagitis: Secondary | ICD-10-CM | POA: Diagnosis not present

## 2023-11-03 DIAGNOSIS — J3489 Other specified disorders of nose and nasal sinuses: Secondary | ICD-10-CM | POA: Diagnosis not present

## 2023-11-08 NOTE — Telephone Encounter (Signed)
 Copied from CRM [id number]. Topic: General - Other >> Nov 03, 2023  5:14 PM Elle L wrote: Reason for CRM: The patient was returning a missed call from the office. His call back number is [phone].

## 2023-11-20 DIAGNOSIS — E041 Nontoxic single thyroid nodule: Secondary | ICD-10-CM | POA: Diagnosis not present

## 2023-12-15 DIAGNOSIS — K219 Gastro-esophageal reflux disease without esophagitis: Secondary | ICD-10-CM | POA: Diagnosis not present

## 2023-12-15 DIAGNOSIS — E041 Nontoxic single thyroid nodule: Secondary | ICD-10-CM | POA: Diagnosis not present

## 2023-12-22 ENCOUNTER — Encounter: Payer: Self-pay | Admitting: Medical-Surgical

## 2023-12-22 ENCOUNTER — Ambulatory Visit (INDEPENDENT_AMBULATORY_CARE_PROVIDER_SITE_OTHER): Payer: BC Managed Care – PPO | Admitting: Medical-Surgical

## 2023-12-22 VITALS — BP 117/71 | HR 89 | Resp 20 | Ht 71.0 in | Wt 173.0 lb

## 2023-12-22 DIAGNOSIS — M7918 Myalgia, other site: Secondary | ICD-10-CM | POA: Diagnosis not present

## 2023-12-22 DIAGNOSIS — F988 Other specified behavioral and emotional disorders with onset usually occurring in childhood and adolescence: Secondary | ICD-10-CM | POA: Diagnosis not present

## 2023-12-22 DIAGNOSIS — Z0000 Encounter for general adult medical examination without abnormal findings: Secondary | ICD-10-CM | POA: Diagnosis not present

## 2023-12-22 DIAGNOSIS — E010 Iodine-deficiency related diffuse (endemic) goiter: Secondary | ICD-10-CM | POA: Diagnosis not present

## 2023-12-22 DIAGNOSIS — G8929 Other chronic pain: Secondary | ICD-10-CM

## 2023-12-22 MED ORDER — NORTRIPTYLINE HCL 75 MG PO CAPS: 75.0000 mg | ORAL_CAPSULE | Freq: Every day | ORAL | 1 refills | Status: DC

## 2023-12-22 NOTE — Progress Notes (Signed)
Complete physical exam  Patient: Larry Mercado   DOB: 05/08/1981   43 y.o. Male  MRN: [id number]  Subjective:    Chief Complaint  Patient presents with   Annual Exam    Larry Mercado is a 43 y.o. male who presents today for a complete physical exam. He reports consuming a general diet.  Walking daily at lunch.  He generally feels fairly well. He reports sleeping fairly well. He does not have additional problems to discuss today.   Most recent fall risk assessment:    09/27/2023   11:31 AM  Fall Risk   Falls in the past year? 0  Number falls in past yr: 0  Injury with Fall? 0  Risk for fall due to : No Fall Risks  Follow up Falls evaluation completed     Most recent depression screenings:    12/22/2023    8:34 AM 09/27/2023   11:32 AM  PHQ 2/9 Scores  PHQ - 2 Score 0 0  PHQ- 9 Score 6     Vision:Within last year, Dental: No current dental problems and Receives regular dental care, and STD: The patient denies history of sexually transmitted disease.    Patient Care Team: Christen Butter, NP as PCP - General (Nurse Practitioner) Thomasene Ripple, DO as PCP - Cardiology (Cardiology) Monica Becton, MD as Consulting Physician (Family Medicine) Early, Sung Amabile, NP as Nurse Practitioner (Nurse Practitioner)   Outpatient Medications Prior to Visit  Medication Sig   albuterol (VENTOLIN HFA) 108 (90 Base) MCG/ACT inhaler INHALE 2 PUFFS INTO THE LUNGS EVERY 6 HOURS AS NEEDED FOR WHEEZING   lidocaine (LIDODERM) 5 % APPLY PATCH TO PAINFUL AREA. PATCH MAY REMAIN IN PLACE FOR UP TO 12 HOURS IN A 24 HOUR PERIOD.   omeprazole (PRILOSEC) 40 MG capsule Take 40 mg by mouth in the morning and at bedtime.   triamcinolone cream (KENALOG) 0.1 % Apply topically 3 (three) times daily.   [DISCONTINUED] nortriptyline (PAMELOR) 50 MG capsule Take 50 mg by mouth at bedtime.   [DISCONTINUED] pantoprazole (PROTONIX) 40 MG tablet Take 1 tablet (40 mg total) by mouth 2 (two) times daily.  (Patient not taking: Reported on 12/22/2023)   No facility-administered medications prior to visit.    Review of Systems  Constitutional:  Negative for chills, fever, malaise/fatigue and weight loss.  HENT:  Negative for congestion, ear pain, hearing loss, sinus pain and sore throat.   Eyes:  Negative for blurred vision, photophobia and pain.  Respiratory:  Positive for shortness of breath. Negative for cough and wheezing.   Cardiovascular:  Positive for palpitations. Negative for chest pain and leg swelling.  Gastrointestinal:  Positive for diarrhea (IBS related). Negative for abdominal pain, constipation, heartburn, nausea and vomiting.  Genitourinary:  Negative for dysuria, frequency and urgency.  Musculoskeletal:  Positive for joint pain and myalgias. Negative for falls and neck pain.  Skin:  Negative for itching and rash.  Neurological:  Negative for dizziness, weakness and headaches.  Endo/Heme/Allergies:  Negative for polydipsia. Does not bruise/bleed easily.  Psychiatric/Behavioral:  Positive for depression. Negative for substance abuse and suicidal ideas. The patient is nervous/anxious and has insomnia.      Objective:     BP 117/71 (BP Location: Left Arm, Cuff Size: Normal)   Pulse 89   Resp 20   Ht 5\' 11"  (1.803 m)   Wt 173 lb (78.5 kg)   SpO2 99%   BMI 24.13 kg/m    Physical Exam Vitals reviewed.  Constitutional:      General: He is not in acute distress.    Appearance: Normal appearance. He is normal weight. He is not ill-appearing.  HENT:     Head: Normocephalic and atraumatic.     Right Ear: Tympanic membrane, ear canal and external ear normal. There is no impacted cerumen.     Left Ear: Tympanic membrane, ear canal and external ear normal. There is no impacted cerumen.     Nose: Nose normal. No congestion or rhinorrhea.     Mouth/Throat:     Mouth: Mucous membranes are moist.     Pharynx: No oropharyngeal exudate or posterior oropharyngeal erythema.  Eyes:      General: No scleral icterus.       Right eye: No discharge.        Left eye: No discharge.     Extraocular Movements: Extraocular movements intact.     Conjunctiva/sclera: Conjunctivae normal.     Pupils: Pupils are equal, round, and reactive to light.  Neck:     Thyroid: No thyromegaly.     Vascular: No carotid bruit or JVD.     Trachea: Trachea normal.  Cardiovascular:     Rate and Rhythm: Normal rate and regular rhythm.     Pulses: Normal pulses.     Heart sounds: Normal heart sounds. No murmur heard.    No friction rub. No gallop.  Pulmonary:     Effort: Pulmonary effort is normal. No respiratory distress.     Breath sounds: Normal breath sounds. No wheezing.  Abdominal:     General: Bowel sounds are normal. There is no distension.     Palpations: Abdomen is soft.     Tenderness: There is no abdominal tenderness. There is no guarding.  Musculoskeletal:        General: Normal range of motion.     Cervical back: Normal range of motion and neck supple.  Lymphadenopathy:     Cervical: No cervical adenopathy.  Skin:    General: Skin is warm and dry.  Neurological:     Mental Status: He is alert and oriented to person, place, and time.     Cranial Nerves: No cranial nerve deficit.  Psychiatric:        Mood and Affect: Mood normal.        Behavior: Behavior normal.        Thought Content: Thought content normal.        Judgment: Judgment normal.      No results found for any visits on 12/22/23.     Assessment & Plan:    Routine Health Maintenance and Physical Exam  Immunization History  Administered Date(s) Administered   HPV 9-valent 01/25/2023, 03/16/2023, 08/04/2023   Influenza, Seasonal, Injecte, Preservative Fre 09/27/2023   Influenza,inj,Quad PF,6+ Mos 11/07/2018, 08/31/2020, 12/14/2021, 08/19/2022   Influenza-Unspecified 08/31/2017   Moderna Sars-Covid-2 Vaccination 03/14/2020, 04/11/2020, 11/01/2020   Tdap 12/15/2017    Health Maintenance  Topic  Date Due   Pneumococcal Vaccine 38-28 Years old (1 of 2 - PCV) 12/21/2033 (Originally 07/29/1989)   DTaP/Tdap/Td (2 - Td or Tdap) 12/16/2027   INFLUENZA VACCINE  Completed   HPV VACCINES  Completed   COVID-19 Vaccine  Discontinued   Hepatitis C Screening  Discontinued   HIV Screening  Discontinued    Discussed health benefits of physical activity, and encouraged him to engage in regular exercise appropriate for his age and condition.  1. Annual physical exam (Primary) Checking labs as below. UTD on  preventative care. Wellness information provided with AVS. - CBC with Differential/Platelet - CMP14+EGFR - Lipid panel  2. Thyromegaly Followed by ENT. Deferring TSH today as last checked 09/2023.   3. Attention deficit disorder (ADD) in adult Not currently medicated and managing well.   4. Chronic musculoskeletal pain Helps some but feels like a higher dose would be more helpful. Increasing Nortriptyline to 75mg  daily.   Return in about 4 weeks (around 01/19/2024) for Nortriptyline follow up (ok to be virtual).   Christen Butter, NP

## 2023-12-22 NOTE — Patient Instructions (Signed)

## 2023-12-23 ENCOUNTER — Encounter: Payer: Self-pay | Admitting: Medical-Surgical

## 2023-12-23 LAB — LIPID PANEL
Chol/HDL Ratio: 4.8 {ratio} (ref 0.0–5.0)
Cholesterol, Total: 169 mg/dL (ref 100–199)
HDL: 35 mg/dL — ABNORMAL LOW (ref >39)
LDL Chol Calc (NIH): 106 mg/dL — ABNORMAL HIGH (ref 0–99)
Triglycerides: 159 mg/dL — ABNORMAL HIGH (ref 0–149)
VLDL Cholesterol Cal: 28 mg/dL (ref 5–40)

## 2023-12-23 LAB — CBC WITH DIFFERENTIAL/PLATELET
Basophils Absolute: 0 x10E3/uL (ref 0.0–0.2)
Basos: 0 %
EOS (ABSOLUTE): 0.2 x10E3/uL (ref 0.0–0.4)
Eos: 4 %
Hematocrit: 50 % (ref 37.5–51.0)
Hemoglobin: 17.1 g/dL (ref 13.0–17.7)
Immature Grans (Abs): 0 x10E3/uL (ref 0.0–0.1)
Immature Granulocytes: 0 %
Lymphocytes Absolute: 1.3 x10E3/uL (ref 0.7–3.1)
Lymphs: 24 %
MCH: 30.8 pg (ref 26.6–33.0)
MCHC: 34.2 g/dL (ref 31.5–35.7)
MCV: 90 fL (ref 79–97)
Monocytes Absolute: 0.5 x10E3/uL (ref 0.1–0.9)
Monocytes: 9 %
Neutrophils Absolute: 3.4 x10E3/uL (ref 1.4–7.0)
Neutrophils: 63 %
Platelets: 238 x10E3/uL (ref 150–450)
RBC: 5.56 x10E6/uL (ref 4.14–5.80)
RDW: 12.3 % (ref 11.6–15.4)
WBC: 5.4 x10E3/uL (ref 3.4–10.8)

## 2023-12-23 LAB — CMP14+EGFR
ALT: 28 IU/L (ref 0–44)
AST: 27 IU/L (ref 0–40)
Albumin: 4.5 g/dL (ref 4.1–5.1)
Alkaline Phosphatase: 109 IU/L (ref 44–121)
BUN/Creatinine Ratio: 13 (ref 9–20)
BUN: 15 mg/dL (ref 6–24)
Bilirubin Total: 0.4 mg/dL (ref 0.0–1.2)
CO2: 26 mmol/L (ref 20–29)
Calcium: 9.7 mg/dL (ref 8.7–10.2)
Chloride: 101 mmol/L (ref 96–106)
Creatinine, Ser: 1.12 mg/dL (ref 0.76–1.27)
Globulin, Total: 2.6 g/dL (ref 1.5–4.5)
Glucose: 101 mg/dL — ABNORMAL HIGH (ref 70–99)
Potassium: 4.5 mmol/L (ref 3.5–5.2)
Sodium: 142 mmol/L (ref 134–144)
Total Protein: 7.1 g/dL (ref 6.0–8.5)
eGFR: 85 mL/min/1.73

## 2023-12-28 ENCOUNTER — Encounter: Payer: BC Managed Care – PPO | Admitting: Medical-Surgical

## 2024-01-19 ENCOUNTER — Telehealth (INDEPENDENT_AMBULATORY_CARE_PROVIDER_SITE_OTHER): Payer: BC Managed Care – PPO | Admitting: Medical-Surgical

## 2024-01-19 ENCOUNTER — Encounter: Payer: Self-pay | Admitting: Medical-Surgical

## 2024-01-19 DIAGNOSIS — G8929 Other chronic pain: Secondary | ICD-10-CM

## 2024-01-19 DIAGNOSIS — M7918 Myalgia, other site: Secondary | ICD-10-CM | POA: Diagnosis not present

## 2024-01-19 MED ORDER — NORTRIPTYLINE HCL 50 MG PO CAPS: 100.0000 mg | ORAL_CAPSULE | Freq: Every day | ORAL | 1 refills | Status: DC

## 2024-01-19 NOTE — Progress Notes (Signed)
 Virtual Visit via Video Note  I connected with Larry Mercado on 01/19/24 at  8:10 AM EDT by a video enabled telemedicine application and verified that I am speaking with the correct person using two identifiers.   I discussed the limitations of evaluation and management by telemedicine and the availability of in person appointments. The patient expressed understanding and agreed to proceed.  Patient location: home Provider locations: office  Subjective:    CC: Nortriptyline follow-up  HPI: Pleasant 43 year old male presenting via MyChart video visit to follow-up on nortriptyline.  He has chronic myofascial pain and has tried multiple agents without benefit.  Most of the agents that he tried had significant side effects that were manageable.  He was switched to nortriptyline which has been doing fairly well and is tolerable.  Was taking 50 mg daily but this was only partially effective.  We increased him to 75 mg daily about 4 weeks ago.  He is tolerating the higher dose with no side effects that are unmanageable.  Feels that the medication effectiveness did not increase by much.  He is still having pain but notes that this is intermittent in severity.  He will have several days where he feels better and then several days where the pain just increases.  Complaining most of his chest and neck discomfort but also has pain in his hips postsurgery.  He did physical therapy after his hip surgery but has not done anything for his chest or neck.  Past medical history, Surgical history, Family history not pertinant except as noted below, Social history, Allergies, and medications have been entered into the medical record, reviewed, and corrections made.   Review of Systems: See HPI for pertinent positives and negatives.   Objective:    General: Speaking clearly in complete sentences without any shortness of breath.  Alert and oriented x3.  Normal judgment. No apparent acute distress.  Impression and  Recommendations:    1. Chronic musculoskeletal pain (Primary) Minimal improvement in symptoms with the dose change to 75 mg.  Do not want to overmedicate however plan to increase dose to 100 mg daily.  Discussed referral to physical therapy for his chest and neck symptoms to help with stretches and exercises.  He will consider this and let me know.  We will touch base in about a month to see how he is responding to the 100 mg daily.  I discussed the assessment and treatment plan with the patient. The patient was provided an opportunity to ask questions and all were answered. The patient agreed with the plan and demonstrated an understanding of the instructions.   The patient was advised to call back or seek an in-person evaluation if the symptoms worsen or if the condition fails to improve as anticipated.  Return in about 4 weeks (around 02/16/2024) for Nortriptyline follow-up.  Thayer Ohm, DNP, APRN, FNP-BC [facility]

## 2024-03-27 ENCOUNTER — Other Ambulatory Visit: Payer: Self-pay | Admitting: Medical-Surgical

## 2024-04-30 ENCOUNTER — Encounter: Payer: Self-pay | Admitting: Family Medicine

## 2024-05-10 ENCOUNTER — Ambulatory Visit: Admitting: Medical-Surgical

## 2024-05-13 DIAGNOSIS — E042 Nontoxic multinodular goiter: Secondary | ICD-10-CM | POA: Diagnosis not present

## 2024-05-13 DIAGNOSIS — E041 Nontoxic single thyroid nodule: Secondary | ICD-10-CM | POA: Diagnosis not present

## 2024-05-23 ENCOUNTER — Ambulatory Visit: Admitting: Gastroenterology

## 2024-05-30 DIAGNOSIS — E041 Nontoxic single thyroid nodule: Secondary | ICD-10-CM | POA: Diagnosis not present

## 2024-05-30 DIAGNOSIS — K219 Gastro-esophageal reflux disease without esophagitis: Secondary | ICD-10-CM | POA: Diagnosis not present

## 2024-06-10 ENCOUNTER — Ambulatory Visit: Payer: Self-pay | Admitting: Gastroenterology

## 2024-06-10 ENCOUNTER — Ambulatory Visit: Admitting: Gastroenterology

## 2024-06-10 ENCOUNTER — Ambulatory Visit (INDEPENDENT_AMBULATORY_CARE_PROVIDER_SITE_OTHER)
Admission: RE | Admit: 2024-06-10 | Discharge: 2024-06-10 | Disposition: A | Discharge status: Home / Self Care | Source: Ambulatory Visit | Attending: Gastroenterology | Admitting: Gastroenterology

## 2024-06-10 ENCOUNTER — Encounter: Payer: Self-pay | Admitting: Gastroenterology

## 2024-06-10 VITALS — BP 132/80 | HR 100 | Ht 71.0 in | Wt 172.0 lb

## 2024-06-10 DIAGNOSIS — R1084 Generalized abdominal pain: Secondary | ICD-10-CM

## 2024-06-10 DIAGNOSIS — K561 Intussusception: Secondary | ICD-10-CM | POA: Diagnosis not present

## 2024-06-10 DIAGNOSIS — R1013 Epigastric pain: Secondary | ICD-10-CM | POA: Diagnosis not present

## 2024-06-10 DIAGNOSIS — K5900 Constipation, unspecified: Secondary | ICD-10-CM | POA: Diagnosis not present

## 2024-06-10 DIAGNOSIS — K219 Gastro-esophageal reflux disease without esophagitis: Secondary | ICD-10-CM

## 2024-06-10 DIAGNOSIS — G8929 Other chronic pain: Secondary | ICD-10-CM

## 2024-06-10 DIAGNOSIS — K581 Irritable bowel syndrome with constipation: Secondary | ICD-10-CM

## 2024-06-10 DIAGNOSIS — R12 Heartburn: Secondary | ICD-10-CM

## 2024-06-10 MED ORDER — OMEPRAZOLE 40 MG PO CPDR
40.0000 mg | DELAYED_RELEASE_CAPSULE | Freq: Every day | ORAL | 1 refills | Status: DC
Start: 2024-06-10 — End: 2024-08-30

## 2024-06-10 MED ORDER — DICYCLOMINE HCL 10 MG PO CAPS
10.0000 mg | ORAL_CAPSULE | Freq: Three times a day (TID) | ORAL | 0 refills | Status: DC | PRN
Start: 2024-06-10 — End: 2024-07-22

## 2024-06-10 NOTE — Progress Notes (Signed)
 Chief Complaint: follow-up GERD, IBS Primary GI Doctor:Dr. San  HPI:  Patient is a  43  year old male patient with past medical history of allergic rhinitis, ADD, chronic back pain, hyperlipidemia, IBS, GERD, right hip surgery 2019, initially seen in GI clinic 03/2019 for evaluation of intermittent right-sided abdominal discomfort.  No hematochezia, melena.  No known personal or family history of IBD, celiac disease, GI malignancy, liver or pancreaticobiliary disease.  Last seen in GI office by Dr. San on 11/11/22 for abdominal pain.  - 02/19/2019: CT abdomen/pelvis: Focal intussusception and proximal jejunum measuring 3 cm in length with otherwise normal-appearing abdomen.  No bowel wall thickening - 02/19/2019: Normal CBC, CMP, lipase, UA - 03/28/2019: GI virtual evaluation.  Recommended MR enterography. - 04/08/2019: MR enterography: Normal - 10/18/2021: Evaluated in [facility] for abdominal pain.  Normal labs, EKG - 10/18/2021: RUQ US : Normal - 10/18/2021: CT abdomen/pelvis: Normal, small fat-containing umbilical hernia - 11/15/2021: Follow-up in the GI clinic.  Recommended low FODMAP diet, discussed relationship of GI symptoms and underlying anxiety/depression.  Schedule EGD/colonoscopy, further lab evaluation.  Changed Prilosec to Nexium  due to breakthrough reflux symptoms. Normal ESR, CRP, porphyria testing  - 11/23/2021: EGD: Candida esophagitis, otherwise normal esophagus, stomach, duodenum.  Treated with fluconazole  - 11/23/2021: Colonoscopy: Normal colon, normal TI.  Small internal hemorrhoids.  Repeat in 10 years - 12/23/2021: Normal CBC, CMP, TSH --11/07/22 CTAP : IMPRESSION: No acute intra-abdominal or pelvic pathology. No CT evidence of intussusception.   Longstanding history of GERD.  Index symptoms of heartburn and regurgitation.  Previously controlled with dietary modifications and occasional OTC antacids prn, but eventually started PPI therapy in 2020 with overall  improvement.  For his history of IBS, describes years of abdominal discomfort and bloating.  Occasional changes in bowel habits (loose stools).  Has had EGD, colonoscopy, and abdominal US  at outside facilities in the past, without any findings.  No records for review in EMR. Done in approx 2012.  Good response to trial of Bentyl  in 2020.  Has otherwise trialed multiple other medications for other issues without much effect on abdominal pain, to include Mobic , Lyrica , gabapentin , menthol patches, lidocaine  patches, amitriptyline     Interval History    Patient presents for evaluation of intermittent heartburn and abdominal pain. He states over the last few months his heartburn has increased.  Patient is currently taking omeprazole  40 mg twice daily.  He reports if he stops the medication his symptoms seem to be worse.  Patient states he will continue with reflux and epigastric discomfort on both sides of his abdomen. Patient denies dysphagia. He reports 10lb weight gain in past year.  Patient also complains of chronic abdominal pain on both sides of his abdomen.  He reports he has had this pain for about 5 to 6 years.  Patient has use lidocaine  patches which seems to help.  Patient also states if he has a bowel movement this seems to help with the pain.  Patient currently having 2-3 normal bowel movements per day.  Patient states he feels his intestines are inflamed.  No blood in stool.  Patient reports he is no longer taking the Bentyl  but cannot recall why he stopped it.  Patient denies any added external stress in the last few months.  Nonsmoker. No alcohol use.   Surgical history: hydrocele age 76, hip surgery   Wt Readings from Last 3 Encounters:  06/10/24 172 lb (78 kg)  12/22/23 173 lb (78.5 kg)  09/27/23 172 lb (  78 kg)    Past Medical History:  Diagnosis Date   Allergic rhinitis    Attention deficit    Bilateral hand pain 01/03/2017   Chronic back pain    Dyslipidemia    Dyspepsia     GERD (gastroesophageal reflux disease)    Groin pain, right lower quadrant    Hemorrhoids    internal, per patient   Herniated cervical disc    IBS (irritable bowel syndrome)    Jejunal intussusception (HCC) 10/12/2021   Sebaceous cyst    Vertigo     Past Surgical History:  Procedure Laterality Date   COLONOSCOPY  2012   In [place] he believes but don't know where. Was told that he had internal hemorrhoids   ESOPHAGOGASTRODUODENOSCOPY  2012   believes it was done same time with colonoscopy   HYDROCELE EXCISION / REPAIR     MOUTH SURGERY     SPERMATIC VEIN LIGATION      Current Outpatient Medications  Medication Sig Dispense Refill   albuterol  (VENTOLIN  HFA) 108 (90 Base) MCG/ACT inhaler INHALE 2 PUFFS INTO THE LUNGS EVERY 6 HOURS AS NEEDED FOR WHEEZING 8.5 g 1   lidocaine  (LIDODERM ) 5 % APPLY PATCH TO PAINFUL AREA. PATCH Akyah Lagrange REMAIN IN PLACE FOR UP TO 12 HOURS IN A 24 HOUR PERIOD. 30 patch 11   nortriptyline  (PAMELOR ) 50 MG capsule Take 2 capsules (100 mg total) by mouth at bedtime. NEEDS APPOINTMENT FOR FURTHER REFILLS. (Patient taking differently: Take 100 mg by mouth at bedtime. NEEDS APPOINTMENT FOR FURTHER REFILLS. Pt is taking 50 mg a day) 60 capsule 0   dicyclomine  (BENTYL ) 10 MG capsule Take 1 capsule (10 mg total) by mouth 3 (three) times daily as needed for spasms. 90 capsule 0   omeprazole  (PRILOSEC) 40 MG capsule Take 1 capsule (40 mg total) by mouth daily. 90 capsule 1   No current facility-administered medications for this visit.    Allergies as of 06/10/2024   (No Known Allergies)    Family History  Problem Relation Age of Onset   Cancer Father        brain   Mitral valve prolapse Father    Heart disease Paternal Grandfather    Alzheimer's disease Maternal Grandmother    Mesothelioma Paternal Grandmother    Prostate cancer Maternal Grandfather    Colon cancer Neg Hx    Esophageal cancer Neg Hx     Review of Systems:    Constitutional: No weight  loss, fever, chills, weakness or fatigue HEENT: Eyes: No change in vision               Ears, Nose, Throat:  No change in hearing or congestion Skin: No rash or itching Cardiovascular: No chest pain, chest pressure or palpitations   Respiratory: No SOB or cough Gastrointestinal: See HPI and otherwise negative Genitourinary: No dysuria or change in urinary frequency Neurological: No headache, dizziness or syncope Musculoskeletal: No new muscle or joint pain Hematologic: No bleeding or bruising Psychiatric: No history of depression or anxiety    Physical Exam:  Vital signs: BP 132/80   Pulse 100   Ht 5' 11 (1.803 m)   Wt 172 lb (78 kg)   BMI 23.99 kg/m   Constitutional:   Pleasant male appears to be in NAD, Well developed, Well nourished, alert and cooperative Throat: Oral cavity and pharynx without inflammation, swelling or lesion.  Respiratory: Respirations even and unlabored. Lungs clear to auscultation bilaterally.   No wheezes, crackles, or rhonchi.  Cardiovascular: Normal S1, S2. Regular rate and rhythm. No peripheral edema, cyanosis or pallor.  Gastrointestinal:  Soft, nondistended, nontender. No rebound or guarding. Hypoactive bowel sounds. No appreciable masses or hepatomegaly. Rectal:  Not performed.  Msk:  Symmetrical without gross deformities. Without edema, no deformity or joint abnormality.  Neurologic:  Alert and  oriented x4;  grossly normal neurologically.  Skin:   Dry and intact without significant lesions or rashes.  RELEVANT LABS AND IMAGING: CBC    Latest Ref Rng & Units 12/22/2023    8:50 AM 09/27/2023   11:52 AM 12/26/2022    9:19 AM  CBC  WBC 3.4 - 10.8 x10E3/uL 5.4  6.8  4.7   Hemoglobin 13.0 - 17.7 g/dL 82.8  83.4  83.5   Hematocrit 37.5 - 51.0 % 50.0  49.8  48.8   Platelets 150 - 450 x10E3/uL 238  243  219      CMP     Latest Ref Rng & Units 12/22/2023    8:50 AM 09/27/2023   11:52 AM 12/26/2022    9:19 AM  CMP  Glucose 70 - 99 mg/dL 898  84   88   BUN 6 - 24 mg/dL 15  11  17    Creatinine 0.76 - 1.27 mg/dL 8.87  9.01  8.98   Sodium 134 - 144 mmol/L 142  141  141   Potassium 3.5 - 5.2 mmol/L 4.5  4.4  4.5   Chloride 96 - 106 mmol/L 101  104  102   CO2 20 - 29 mmol/L 26  24  31    Calcium 8.7 - 10.2 mg/dL 9.7  9.6  9.5   Total Protein 6.0 - 8.5 g/dL 7.1  7.1  7.1   Total Bilirubin 0.0 - 1.2 mg/dL 0.4  0.5  0.7   Alkaline Phos 44 - 121 IU/L 109  105    AST 0 - 40 IU/L 27  35  21   ALT 0 - 44 IU/L 28  42  22      Lab Results  Component Value Date   TSH 0.754 09/27/2023     Assessment: Encounter Diagnoses  Name Primary?   Irritable bowel syndrome with constipation Yes   Gastroesophageal reflux disease without esophagitis    Heartburn    Generalized abdominal pain    Abdominal pain, chronic, epigastric      43 year old male patient who presents for follow-up on IBS and GERD.  Patient recently has had a flareup of symptoms.  Denies any changes to medications or increased stress.  We discussed further workup with EM with pH/Mii to eval for EH, FHB, etc vs Bravo, patient prefers procedure when he is asleep.  Will discuss with Dr. San.  Patient will continue PPI therapy twice daily.  Will also do ultrasound right upper quadrant per patient's request to rule out gallbladder disease.    Patient has also continued to have issues with IBS and has concerns he Robina Hamor have stool buildup or blockage, we will go ahead and order 2 view abdominal x-ray today to rule out constipation.  Recommend low FODMAP diet.  Patient would like to restart Bentyl  and use as needed.  I suspect the pain he is experiencing D'Arcy Abraha be MSK as it is relieved with lidocaine  patches. He does sit at desk for work and we talked about stretching and possibly physical therapy evaluation.   Plan: - Continue Omeprazole  40 mg po twice daily  -Order RUQ abdominal US  -Offered EM with pH/Mii  to eval for EH, FHB, etc vs Bravo, will discuss with Dr. San - Order  Abdominal xray 2 view r/o constipation -Recommend low fodmap diet - Restart Bentyl  10mg  TID prn  Thank you for the courtesy of this consult. Please call me with any questions or concerns.   Mahkayla Preece, FNP-C [facility] 06/10/2024, 10:56 AM  Cc: Willo Mini, NP

## 2024-06-10 NOTE — Patient Instructions (Addendum)
 GERD Recommend GERD diet Continue Omeprazole  40 mg po twice daily  Will discuss further testing with Dr. San    IBS Recommend low fodmap diet Resent bentyl  , take as needed for abdominal spasms  Your provider has requested that you have an abdominal x ray before leaving today. Please go to the basement floor to our Radiology department for the test.   You have been scheduled for an abdominal ultrasound at [facility] (1st floor of hospital) on 06/12/24 at 6:00pm. Please arrive 30 minutes prior to your appointment for registration. Make certain not to have anything to eat or drink 6 hours prior to your appointment. Should you need to reschedule your appointment, please contact radiology at [phone]. This test typically takes about 30 minutes to perform.  _______________________________________________________  If your blood pressure at your visit was 140/90 or greater, please contact your primary care physician to follow up on this.  _______________________________________________________  If you are age 17 or older, your body mass index should be between 23-30. Your Body mass index is 23.99 kg/m. If this is out of the aforementioned range listed, please consider follow up with your Primary Care Provider.  If you are age 37 or younger, your body mass index should be between 19-25. Your Body mass index is 23.99 kg/m. If this is out of the aformentioned range listed, please consider follow up with your Primary Care Provider.   ________________________________________________________  The [facility] providers would like to encourage you to use MYCHART to communicate with providers for non-urgent requests or questions.  Due to long hold times on the telephone, sending your provider a message by [facility] may be a faster and more efficient way to get a response.  Please allow 48 business hours for a response.  Please remember that this is for non-urgent requests.   _______________________________________________________  [facility] is using a team-based approach to care.  Your team is made up of your doctor and two to three APPS. Our APPS (Nurse Practitioners and Physician Assistants) work with your physician to ensure care continuity for you. They are fully qualified to address your health concerns and develop a treatment plan. They communicate directly with your gastroenterologist to care for you. Seeing the Advanced Practice Practitioners on your physician's team can help you by facilitating care more promptly, often allowing for earlier appointments, access to diagnostic testing, procedures, and other specialty referrals.    Thank you for trusting me with your gastrointestinal care. Deanna May, FNP-C

## 2024-06-12 ENCOUNTER — Ambulatory Visit (HOSPITAL_COMMUNITY)
Admission: RE | Admit: 2024-06-12 | Discharge: 2024-06-12 | Disposition: A | Discharge status: Home / Self Care | Source: Ambulatory Visit | Attending: Gastroenterology | Admitting: Gastroenterology

## 2024-06-12 DIAGNOSIS — R1013 Epigastric pain: Secondary | ICD-10-CM | POA: Insufficient documentation

## 2024-06-12 DIAGNOSIS — G8929 Other chronic pain: Secondary | ICD-10-CM | POA: Diagnosis not present

## 2024-06-12 DIAGNOSIS — R1084 Generalized abdominal pain: Secondary | ICD-10-CM | POA: Diagnosis not present

## 2024-06-20 NOTE — Progress Notes (Signed)
 Agree with the assessment and plan as outlined by [facility], FNP-C.  Partly depends on his symptoms and the goals of the study.  If wanting to evaluate his breakthrough reflux symptoms despite high-dose PPI, would plan on study to be done on PPI to evaluate breakthrough and presence of acid or nonacid refluxate.  However, reviewed prior notes as well, and there is question of possibly having esophageal hypersensitivity or functional heartburn.  Agree that pH/impedance testing would be a stronger study than Bravo, particular with the impedance portion.    After careful review of his record, my recommendation would be to do EGD off PPI therapy to establish presence and severity of esophageal acid exposure along with symptom correlation.  Recommend being off PPI for at least 5 days to allow washout.  Larry Tavano, DO, [facility]

## 2024-06-27 DIAGNOSIS — R079 Chest pain, unspecified: Secondary | ICD-10-CM | POA: Diagnosis not present

## 2024-06-27 DIAGNOSIS — R1084 Generalized abdominal pain: Secondary | ICD-10-CM | POA: Diagnosis not present

## 2024-06-27 DIAGNOSIS — R0789 Other chest pain: Secondary | ICD-10-CM | POA: Diagnosis not present

## 2024-06-27 LAB — LAB REPORT - SCANNED

## 2024-06-28 ENCOUNTER — Other Ambulatory Visit: Payer: Self-pay

## 2024-06-28 DIAGNOSIS — R12 Heartburn: Secondary | ICD-10-CM

## 2024-06-28 DIAGNOSIS — K219 Gastro-esophageal reflux disease without esophagitis: Secondary | ICD-10-CM

## 2024-06-28 DIAGNOSIS — R1084 Generalized abdominal pain: Secondary | ICD-10-CM

## 2024-06-28 NOTE — Telephone Encounter (Signed)
 Pt made aware of recommendations Pt was scheduled for the EGD with Dr. San on 9/109/2025 at 2:30 PM.  Pt made aware. Pt notified to DO NOT TAKE ANY REFLUX MEDICATIONS 5 DAYS PRIOR TO YOUR PROCEDURE.  Ambulatory referral to GI placed in Epic.  Prep instructions was sent to pt via my chart. Pt made aware. Pt verbalized understanding with all questions answered.

## 2024-07-02 ENCOUNTER — Encounter: Payer: Self-pay | Admitting: Sports Medicine

## 2024-07-03 ENCOUNTER — Other Ambulatory Visit: Payer: Self-pay | Admitting: Medical Genetics

## 2024-07-10 ENCOUNTER — Encounter: Admitting: Gastroenterology

## 2024-07-10 ENCOUNTER — Telehealth: Payer: Self-pay | Admitting: Gastroenterology

## 2024-07-10 NOTE — Telephone Encounter (Signed)
 Hi Dr San,   Patient came in without a care partner stating he was unaware. Patient's instruction were sent to him on MyChart which stated he was suppose to come in with a care partner for the procedure and patient verbalize understanding per note on Epic. He rescheduled for 9/20. I will no show him for today's procedure.    Thank you

## 2024-07-22 ENCOUNTER — Other Ambulatory Visit: Payer: Self-pay | Admitting: Gastroenterology

## 2024-07-22 DIAGNOSIS — K581 Irritable bowel syndrome with constipation: Secondary | ICD-10-CM

## 2024-07-25 ENCOUNTER — Other Ambulatory Visit

## 2024-08-19 ENCOUNTER — Encounter: Payer: Self-pay | Admitting: Gastroenterology

## 2024-08-19 ENCOUNTER — Ambulatory Visit: Admitting: Gastroenterology

## 2024-08-19 VITALS — BP 100/63 | HR 79 | Temp 98.1°F | Resp 20 | Ht 71.0 in | Wt 172.0 lb

## 2024-08-19 DIAGNOSIS — R12 Heartburn: Secondary | ICD-10-CM

## 2024-08-19 DIAGNOSIS — R1084 Generalized abdominal pain: Secondary | ICD-10-CM | POA: Diagnosis not present

## 2024-08-19 DIAGNOSIS — G8929 Other chronic pain: Secondary | ICD-10-CM

## 2024-08-19 DIAGNOSIS — K2950 Unspecified chronic gastritis without bleeding: Secondary | ICD-10-CM

## 2024-08-19 DIAGNOSIS — K219 Gastro-esophageal reflux disease without esophagitis: Secondary | ICD-10-CM

## 2024-08-19 MED ORDER — SODIUM CHLORIDE 0.9 % IV SOLN: 500.0000 mL | INTRAVENOUS | Status: DC

## 2024-08-19 NOTE — Patient Instructions (Signed)
-   Advance diet as tolerated. - Await pathology results. - No omeprazole  or other acid suppression therapy for the next 48 hours. - Will follow-up on the Bravo results when the study has been completed.   YOU HAD AN ENDOSCOPIC PROCEDURE TODAY AT THE [facility]:   Refer to the procedure report that was given to you for any specific questions about what was found during the examination.  If the procedure report does not answer your questions, please call your gastroenterologist to clarify.  If you requested that your care partner not be given the details of your procedure findings, then the procedure report has been included in a sealed envelope for you to review at your convenience later.  YOU SHOULD EXPECT: Some feelings of bloating in the abdomen. Passage of more gas than usual.  Walking can help get rid of the air that was put into your GI tract during the procedure and reduce the bloating. If you had a lower endoscopy (such as a colonoscopy or flexible sigmoidoscopy) you may notice spotting of blood in your stool or on the toilet paper. If you underwent a bowel prep for your procedure, you may not have a normal bowel movement for a few days.  Please Note:  You might notice some irritation and congestion in your nose or some drainage.  This is from the oxygen used during your procedure.  There is no need for concern and it should clear up in a day or so.  SYMPTOMS TO REPORT IMMEDIATELY: Following upper endoscopy (EGD)  Vomiting of blood or coffee ground material  New chest pain or pain under the shoulder blades  Painful or persistently difficult swallowing  New shortness of breath  Fever of 100F or higher  Black, tarry-looking stools  For urgent or emergent issues, a gastroenterologist can be reached at any hour by calling [phone]. Do not use MyChart messaging for urgent concerns.    DIET:  We do recommend a small meal at first, but then you may proceed to your regular  diet.  Drink plenty of fluids but you should avoid alcoholic beverages for 24 hours.  ACTIVITY:  You should plan to take it easy for the rest of today and you should NOT DRIVE or use heavy machinery until tomorrow (because of the sedation medicines used during the test).    FOLLOW UP: Our staff will call the number listed on your records the next business day following your procedure.  We will call around 7:15- 8:00 am to check on you and address any questions or concerns that you may have regarding the information given to you following your procedure. If we do not reach you, we will leave a message.     If any biopsies were taken you will be contacted by phone or by letter within the next 1-3 weeks.  Please call us  at [phone] if you have not heard about the biopsies in 3 weeks.    SIGNATURES/CONFIDENTIALITY: You and/or your care partner have signed paperwork which will be entered into your electronic medical record.  These signatures attest to the fact that that the information above on your After Visit Summary has been reviewed and is understood.  Full responsibility of the confidentiality of this discharge information lies with you and/or your care-partner.

## 2024-08-19 NOTE — Op Note (Signed)
 [facility] Patient Name: Larry Mercado Procedure Date: 08/19/2024 3:12 PM MRN: [id number] Endoscopist: Sandor Flatter , MD, [id number] Age: 43 Referring MD:  Date of Birth: [birth year] Gender: Male Account #: [id number] Procedure:                Upper GI endoscopy with Bravo placement (off PPI                            therapy) and biopsy Indications:              Heartburn, Esophageal reflux, Preoperative                            assessment Medicines:                Monitored Anesthesia Care Procedure:                Pre-Anesthesia Assessment:                           - Prior to the procedure, a History and Physical                            was performed, and patient medications and                            allergies were reviewed. The patient's tolerance of                            previous anesthesia was also reviewed. The risks                            and benefits of the procedure and the sedation                            options and risks were discussed with the patient.                            All questions were answered, and informed consent                            was obtained. Prior Anticoagulants: The patient has                            taken no anticoagulant or antiplatelet agents. ASA                            Grade Assessment: II - A patient with mild systemic                            disease. After reviewing the risks and benefits,                            the patient was deemed in satisfactory condition to  undergo the procedure.                           After obtaining informed consent, the endoscope was                            passed under direct vision. Throughout the                            procedure, the patient's blood pressure, pulse, and                            oxygen saturations were monitored continuously. The                            Olympus Scope [id number] was introduced through  the                            mouth, and advanced to the third part of duodenum.                            The upper GI endoscopy was accomplished without                            difficulty. The patient tolerated the procedure                            well. Scope In: Scope Out: Findings:                 The examined esophagus was normal.                           The Z-line was regular and was found 45 cm from the                            incisors. The BRAVO capsule with delivery system                            was introduced through the mouth and advanced into                            the esophagus, such that the BRAVO pH capsule was                            positioned 39 cm from the incisors, which was 6 cm                            proximal to the GE junction. The BRAVO pH capsule                            was then deployed and attached to the esophageal  mucosa. The delivery system was then withdrawn.                            Endoscopy was utilized for probe placement and                            diagnostic evaluation. The scope was reinserted to                            evaluate placement of the BRAVO capsule.                            Visualization showed the BRAVO capsule to be in an                            appropriate position.                           The gastroesophageal flap valve was visualized                            endoscopically and classified as Hill Grade II                            (fold present, opens with respiration).                           Scattered minimal inflammation characterized by                            erythema was found in the gastric body and in the                            gastric antrum. Biopsies were taken with a cold                            forceps for Helicobacter pylori testing. Estimated                            blood loss was minimal.                           The examined  duodenum was normal. Complications:            No immediate complications. Estimated Blood Loss:     Estimated blood loss was minimal. Impression:               - Normal esophagus.                           - Z-line regular, 45 cm from the incisors.                           - Gastroesophageal flap valve classified as Hill  Grade II (fold present, opens with respiration).                           - Gastritis. Biopsied.                           - Normal examined duodenum.                           - The BRAVO pH capsule was deployed. Recommendation:           - Patient has a contact number available for                            emergencies. The signs and symptoms of potential                            delayed complications were discussed with the                            patient. Return to normal activities tomorrow.                            Written discharge instructions were provided to the                            patient.                           - Advance diet as tolerated.                           - Await pathology results.                           - No omeprazole  or other acid suppression therapy                            for the next 48 hours.                           - Will follow-up on the Bravo results when the                            study has been completed. Sandor Flatter, MD 08/19/2024 4:16:39 PM

## 2024-08-19 NOTE — Progress Notes (Signed)
 Called to room to assist during endoscopic procedure.  Patient ID and intended procedure confirmed with present staff. Received instructions for my participation in the procedure from the performing physician.  Capsule expiration date- 04/14/2025  Capsule ID number [id number]  LES measurement: 45 (capsule placed 6 cm above LES) 39  Time of implant: 1608 pm

## 2024-08-19 NOTE — Progress Notes (Signed)
 VSS NAD TRANS TO PACU

## 2024-08-19 NOTE — Progress Notes (Signed)
 Pt's states no medical or surgical changes since previsit or office visit.

## 2024-08-19 NOTE — Progress Notes (Signed)
 GASTROENTEROLOGY PROCEDURE H&P NOTE   Primary Care Physician: Willo Mini, NP    Reason for Procedure:  Heartburn, GERD  Plan:    EGD with Bravo placement (off PPI therapy).  Patient is appropriate for endoscopic procedure(s) in the ambulatory ([facility]) setting.  The nature of the procedure, as well as the risks, benefits, and alternatives were carefully and thoroughly reviewed with the patient. Ample time for discussion and questions allowed. The patient understood, was satisfied, and agreed to proceed.     HPI: Larry Mercado is a 43 y.o. male who presents for a longstanding history of, characterized by heartburn and regurgitation.  Symptoms overall improved with Pepcid twice daily, but still does have breakthrough symptoms.  Stopped taking PPI 5+ days especially procedures to.  Presents today for EGD with Bravo placement to evaluate for grade/severity of reflux, preoperative evaluation for potential antireflux surgery in the future.  Past Medical History:  Diagnosis Date   Allergic rhinitis    Attention deficit    Bilateral hand pain 01/03/2017   Chronic back pain    Dyslipidemia    Dyspepsia    GERD (gastroesophageal reflux disease)    Groin pain, right lower quadrant    Hemorrhoids    internal, per patient   Herniated cervical disc    IBS (irritable bowel syndrome)    Jejunal intussusception (HCC) 10/12/2021   Sebaceous cyst    Vertigo     Past Surgical History:  Procedure Laterality Date   COLONOSCOPY  2012   In [place] he believes but don't know where. Was told that he had internal hemorrhoids   ESOPHAGOGASTRODUODENOSCOPY  2012   believes it was done same time with colonoscopy   HYDROCELE EXCISION / REPAIR     MOUTH SURGERY     SPERMATIC VEIN LIGATION      Prior to Admission medications   Medication Sig Start Date End Date Taking? Authorizing Provider  albuterol  (VENTOLIN  HFA) 108 (90 Base) MCG/ACT inhaler INHALE 2 PUFFS INTO THE LUNGS EVERY 6 HOURS AS  NEEDED FOR WHEEZING 09/05/23   Willo Mini, NP  dicyclomine  (BENTYL ) 10 MG capsule TAKE 1 CAPSULE BY MOUTH 3 TIMES DAILY AS NEEDED FOR SPASMS. 07/22/24   May, Deanna J, NP  lidocaine  (LIDODERM ) 5 % APPLY PATCH TO PAINFUL AREA. PATCH MAY REMAIN IN PLACE FOR UP TO 12 HOURS IN A 24 HOUR PERIOD. 07/25/23   Willo Mini, NP  nortriptyline  (PAMELOR ) 50 MG capsule Take 2 capsules (100 mg total) by mouth at bedtime. NEEDS APPOINTMENT FOR FURTHER REFILLS. Patient taking differently: Take 100 mg by mouth at bedtime. NEEDS APPOINTMENT FOR FURTHER REFILLS. Pt is taking 50 mg a day 03/27/24   Willo Mini, NP  omeprazole  (PRILOSEC) 40 MG capsule Take 1 capsule (40 mg total) by mouth daily. 06/10/24   May, Deanna J, NP    Current Outpatient Medications  Medication Sig Dispense Refill   albuterol  (VENTOLIN  HFA) 108 (90 Base) MCG/ACT inhaler INHALE 2 PUFFS INTO THE LUNGS EVERY 6 HOURS AS NEEDED FOR WHEEZING 8.5 g 1   dicyclomine  (BENTYL ) 10 MG capsule TAKE 1 CAPSULE BY MOUTH 3 TIMES DAILY AS NEEDED FOR SPASMS. 90 capsule 0   lidocaine  (LIDODERM ) 5 % APPLY PATCH TO PAINFUL AREA. PATCH MAY REMAIN IN PLACE FOR UP TO 12 HOURS IN A 24 HOUR PERIOD. 30 patch 11   nortriptyline  (PAMELOR ) 50 MG capsule Take 2 capsules (100 mg total) by mouth at bedtime. NEEDS APPOINTMENT FOR FURTHER REFILLS. (Patient taking differently: Take 100  mg by mouth at bedtime. NEEDS APPOINTMENT FOR FURTHER REFILLS. Pt is taking 50 mg a day) 60 capsule 0   omeprazole  (PRILOSEC) 40 MG capsule Take 1 capsule (40 mg total) by mouth daily. 90 capsule 1   Current Facility-Administered Medications  Medication Dose Route Frequency Provider Last Rate Last Admin   0.9 %  sodium chloride  infusion  500 mL Intravenous Continuous Brysin Towery V, DO        Allergies as of 08/19/2024   (No Known Allergies)    Family History  Problem Relation Age of Onset   Cancer Father        brain   Mitral valve prolapse Father    Heart disease Paternal Grandfather     Alzheimer's disease Maternal Grandmother    Mesothelioma Paternal Grandmother    Prostate cancer Maternal Grandfather    Colon cancer Neg Hx    Esophageal cancer Neg Hx     Social History   Socioeconomic History   Marital status: Married    Spouse name: elizabeth   Number of children: Not on file   Years of education: Not on file   Highest education level: Bachelor's degree (e.g., BA, AB, BS)  Occupational History   Occupation: Full time  Tobacco Use   Smoking status: Never   Smokeless tobacco: Never  Vaping Use   Vaping status: Never Used  Substance and Sexual Activity   Alcohol use: Not Currently    Comment: rarely    Drug use: No   Sexual activity: Yes  Other Topics Concern   Not on file  Social History Narrative   Lives with wife and 3 children   Right Handed   Drinks 3-4 cups caffeine daily   Social Drivers of Health   Financial Resource Strain: Low Risk  (04/02/2023)   Overall Financial Resource Strain (CARDIA)    Difficulty of Paying Living Expenses: Not hard at all  Food Insecurity: No Food Insecurity (04/02/2023)   Hunger Vital Sign    Worried About Running Out of Food in the Last Year: Never true    Ran Out of Food in the Last Year: Never true  Transportation Needs: No Transportation Needs (04/02/2023)   PRAPARE - Administrator, Civil Service (Medical): No    Lack of Transportation (Non-Medical): No  Physical Activity: Not on file  Stress: Not on file  Social Connections: Socially Integrated (04/02/2023)   Social Connection and Isolation Panel    Frequency of Communication with Friends and Family: Twice a week    Frequency of Social Gatherings with Friends and Family: Twice a week    Attends Religious Services: More than 4 times per year    Active Member of [employer] or Organizations: Yes    Attends Banker Meetings: Patient declined    Marital Status: Married  Catering manager Violence: Unknown (01/26/2023)   Received from [facility]    HITS    Physically Hurt: Not on file    Insult or Talk Down To: Not on file    Threaten Physical Harm: Not on file    Scream or Curse: Not on file    Physical Exam: Vital signs in last 24 hours: @BP  135/78   Pulse 91   Temp 98.1 F (36.7 C) (Skin)   Ht 5' 11 (1.803 m)   Wt 172 lb (78 kg)   SpO2 99%   BMI 23.99 kg/m  GEN: NAD EYE: Sclerae anicteric ENT: MMM CV: Non-tachycardic Pulm: CTA b/l  GI: Soft, NT/ND NEURO:  Alert & Oriented x 3   Sandor Flatter, DO [facility]   08/19/2024 3:31 PM

## 2024-08-20 ENCOUNTER — Telehealth: Payer: Self-pay

## 2024-08-20 NOTE — Telephone Encounter (Signed)
  Follow up Call-     08/19/2024    3:30 PM  Call back number  Post procedure Call Back phone  # [phone]  Permission to leave phone message Yes     Patient questions:  Do you have a fever, pain , or abdominal swelling? No. Pain Score  0 *  Have you tolerated food without any problems? Yes.    Have you been able to return to your normal activities? Yes.    Do you have any questions about your discharge instructions: Diet   No. Medications  No. Follow up visit  No.  Do you have questions or concerns about your Care? No.  Actions: * If pain score is 4 or above: No action needed, pain <4.

## 2024-08-22 LAB — SURGICAL PATHOLOGY

## 2024-08-23 ENCOUNTER — Ambulatory Visit: Payer: Self-pay | Admitting: Gastroenterology

## 2024-08-29 ENCOUNTER — Encounter: Payer: Self-pay | Admitting: Gastroenterology

## 2024-08-29 ENCOUNTER — Other Ambulatory Visit: Payer: Self-pay | Admitting: Gastroenterology

## 2024-08-29 DIAGNOSIS — R12 Heartburn: Secondary | ICD-10-CM

## 2024-08-30 ENCOUNTER — Telehealth: Payer: Self-pay

## 2024-08-30 ENCOUNTER — Other Ambulatory Visit (HOSPITAL_COMMUNITY): Payer: Self-pay

## 2024-08-30 DIAGNOSIS — R12 Heartburn: Secondary | ICD-10-CM

## 2024-08-30 NOTE — Telephone Encounter (Signed)
 Pharmacy Patient Advocate Encounter   Received notification from CoverMyMeds that prior authorization for Omeprazole  40MG  dr capsules  is required/requested.   Insurance verification completed.   The patient is insured through [facility].   Per test claim: Patient has reached refill limit at retail level. Insurance requesting patient to do mail order. Patient to call insurance.

## 2024-09-03 ENCOUNTER — Telehealth: Payer: Self-pay | Admitting: Gastroenterology

## 2024-09-03 ENCOUNTER — Ambulatory Visit: Payer: Self-pay | Admitting: Gastroenterology

## 2024-09-03 NOTE — Telephone Encounter (Signed)
 Inbound call from patient stating he received bravo egd results through MyChart but would like to speak to nurse to gets results explained to him and would like to be advised on what to do next. Requesting a call back  Please advise  Thank you

## 2024-09-03 NOTE — Telephone Encounter (Signed)
 Results from the Bravo study (conducted of acid suppression therapy) were reviewed and notable for the following:  1.  Increased esophageal acid exposure with % time pH <4 of 4.7% (normal <4%).  DeMeester score 15.2 (normal <14.72) 2.  Esophageal acid exposure is normal in upright and supine position 3.  No symptom correlation for heartburn, dysphagia, regurgitation with reflux events based on SAP  Impression: Evidence of gastroesophageal reflux disease with slightly elevated time of esophageal acid exposure (4.7%) and mildly elevated DeMeester score at 15.2.  This was primarily driven by scores from day 2 with esophageal acid exposure time of 5.3% and DeMeester score of 16.7.  Recommendations: - Continue acid suppression medications - Schedule follow-up appoint with me in the GI clinic.  We can discuss the role/utility of continued medical management, alternative medication options, or if he would like to proceed with antireflux surgery as a means to better control his reflux and potentially stop acid suppression medications, we will discuss the role of TIF and other antireflux surgical options - Continue antireflux lifestyle/dietary modifications

## 2024-09-04 MED ORDER — OMEPRAZOLE 40 MG PO CPDR
40.0000 mg | DELAYED_RELEASE_CAPSULE | Freq: Every day | ORAL | 1 refills | Status: AC
Start: 2024-09-04 — End: ?

## 2024-09-04 NOTE — Addendum Note (Signed)
 Addended by: WILL POWELL CROME on: 09/04/2024 04:44 PM   Modules accepted: Orders

## 2024-09-04 NOTE — Telephone Encounter (Signed)
 Patient informed and sent script to [facility]

## 2024-09-04 NOTE — Telephone Encounter (Signed)
 Left message for patient to call office.

## 2024-09-17 ENCOUNTER — Other Ambulatory Visit: Payer: Self-pay | Admitting: Gastroenterology

## 2024-09-17 DIAGNOSIS — K581 Irritable bowel syndrome with constipation: Secondary | ICD-10-CM

## 2024-10-02 ENCOUNTER — Other Ambulatory Visit

## 2024-10-02 DIAGNOSIS — Z006 Encounter for examination for normal comparison and control in clinical research program: Secondary | ICD-10-CM

## 2024-10-12 LAB — GENECONNECT MOLECULAR SCREEN

## 2024-11-07 ENCOUNTER — Telehealth: Payer: Self-pay | Admitting: Medical-Surgical

## 2024-11-07 DIAGNOSIS — R0981 Nasal congestion: Secondary | ICD-10-CM

## 2024-11-07 DIAGNOSIS — E041 Nontoxic single thyroid nodule: Secondary | ICD-10-CM

## 2024-11-07 NOTE — Telephone Encounter (Signed)
 Copied from CRM #[id number]. Topic: Referral - Request for Referral >> Nov 07, 2024  1:36 PM Willma R wrote: Did the patient discuss referral with their provider in the last year? Yes  Appointment offered? No  Type of order/referral and detailed reason for visit: ENT, patient already established just need a new referral per his insurance.  Preference of office, provider, location:  [facility] [address] [place], [place] [address] [phone]  If referral order, have you been seen by this specialty before? Yes Currently a patient just needs new referral  Can we respond through MyChart? Yes

## 2024-11-20 ENCOUNTER — Other Ambulatory Visit: Payer: Self-pay | Admitting: Gastroenterology

## 2024-11-20 DIAGNOSIS — K581 Irritable bowel syndrome with constipation: Secondary | ICD-10-CM
